# Patient Record
Sex: Female | Born: 1960 | Race: White | Hispanic: No | Marital: Single | Smoking: Current every day smoker
Health system: Southern US, Community
[De-identification: ages and names within clinical notes are randomized; demographics above are authoritative.]

## PROBLEM LIST (undated history)

## (undated) DIAGNOSIS — F419 Anxiety disorder, unspecified: Secondary | ICD-10-CM

## (undated) DIAGNOSIS — M1990 Unspecified osteoarthritis, unspecified site: Secondary | ICD-10-CM

## (undated) DIAGNOSIS — E039 Hypothyroidism, unspecified: Secondary | ICD-10-CM

## (undated) HISTORY — PX: APPENDECTOMY: SHX54

## (undated) HISTORY — PX: OTHER SURGICAL HISTORY: SHX169

---

## 2003-11-11 ENCOUNTER — Ambulatory Visit (HOSPITAL_COMMUNITY): Admission: RE | Admit: 2003-11-11 | Discharge: 2003-11-11 | Payer: Self-pay | Admitting: Orthopaedic Surgery

## 2005-05-10 ENCOUNTER — Ambulatory Visit (HOSPITAL_COMMUNITY): Admission: RE | Admit: 2005-05-10 | Discharge: 2005-05-10 | Payer: Self-pay | Admitting: Pediatrics

## 2005-05-19 ENCOUNTER — Ambulatory Visit (HOSPITAL_COMMUNITY): Admission: RE | Admit: 2005-05-19 | Discharge: 2005-05-19 | Payer: Self-pay | Admitting: Pediatrics

## 2005-05-30 ENCOUNTER — Encounter (INDEPENDENT_AMBULATORY_CARE_PROVIDER_SITE_OTHER): Payer: Self-pay | Admitting: *Deleted

## 2005-05-30 ENCOUNTER — Ambulatory Visit (HOSPITAL_COMMUNITY): Admission: RE | Admit: 2005-05-30 | Discharge: 2005-05-30 | Payer: Self-pay | Admitting: Pediatrics

## 2005-09-07 ENCOUNTER — Encounter (HOSPITAL_COMMUNITY): Admission: RE | Admit: 2005-09-07 | Discharge: 2005-09-30 | Payer: Self-pay | Admitting: Pediatrics

## 2005-09-29 ENCOUNTER — Encounter (INDEPENDENT_AMBULATORY_CARE_PROVIDER_SITE_OTHER): Payer: Self-pay | Admitting: *Deleted

## 2005-09-29 ENCOUNTER — Inpatient Hospital Stay (HOSPITAL_COMMUNITY): Admission: EM | Admit: 2005-09-29 | Discharge: 2005-10-01 | Payer: Self-pay | Admitting: Emergency Medicine

## 2006-07-12 ENCOUNTER — Encounter (HOSPITAL_COMMUNITY): Admission: RE | Admit: 2006-07-12 | Discharge: 2006-08-11 | Payer: Self-pay | Admitting: Endocrinology

## 2007-03-31 ENCOUNTER — Emergency Department (HOSPITAL_COMMUNITY): Admission: EM | Admit: 2007-03-31 | Discharge: 2007-03-31 | Payer: Self-pay | Admitting: Emergency Medicine

## 2007-04-01 ENCOUNTER — Emergency Department (HOSPITAL_COMMUNITY): Admission: EM | Admit: 2007-04-01 | Discharge: 2007-04-01 | Payer: Self-pay | Admitting: Emergency Medicine

## 2007-05-07 ENCOUNTER — Ambulatory Visit (HOSPITAL_COMMUNITY): Admission: RE | Admit: 2007-05-07 | Discharge: 2007-05-07 | Payer: Self-pay | Admitting: Pediatrics

## 2007-05-28 ENCOUNTER — Ambulatory Visit (HOSPITAL_COMMUNITY): Admission: RE | Admit: 2007-05-28 | Discharge: 2007-05-28 | Payer: Self-pay | Admitting: Pediatrics

## 2009-06-01 ENCOUNTER — Ambulatory Visit (HOSPITAL_COMMUNITY): Admission: RE | Admit: 2009-06-01 | Discharge: 2009-06-01 | Payer: Self-pay | Admitting: Pediatrics

## 2010-01-22 ENCOUNTER — Encounter: Payer: Self-pay | Admitting: Neurology

## 2010-01-23 ENCOUNTER — Encounter: Payer: Self-pay | Admitting: Pediatrics

## 2010-05-20 NOTE — Op Note (Signed)
[name], [name]                 ACCOUNT NO.:  [id number]   MEDICAL RECORD NO.:  [id number]          PATIENT TYPE:  INP   LOCATION:  A319                          FACILITY:  [facility]   PHYSICIAN:  Barbaraann Barthel, M.D. DATE OF BIRTH:  1960-02-19   DATE OF PROCEDURE:  09/29/2005  DATE OF DISCHARGE:  10/01/2005                                 OPERATIVE REPORT   SURGEON:  Barbaraann Barthel, M.D.   PREOPERATIVE DIAGNOSIS:  Acute appendicitis.   POSTOPERATIVE DIAGNOSIS:  Acute appendicitis.   PROCEDURE:  Open appendectomy.   NOTE:  This is a 50 year old white female who came to the emergency room  with signs and symptoms of acute appendicitis with right lower quadrant  pain, elevated white count and nausea and vomiting.  This was also confirmed  on CT scan.  We discussed surgery with her preoperatively, discussing  complications not limited to but including bleeding, infection and leaking  from appendiceal stump.  Informed consent was obtained.   GROSS OPERATIVE FINDINGS:  Those consistent with acute suppurative  nonperforated appendicitis.  The rest of the right lower quadrant and the  terminal ileum was normal.   TECHNIQUE:  The patient was placed in the supine position.  After the  adequate administration of general anesthesia, her entire abdomen was  prepped with Betadine solution and draped in the usual manner.  A low small  transverse incision was carried out in the right lower quadrant through  skin, subcutaneous tissue down to the rectus muscle, the sheath of which was  opened and the muscle was retracted medially.  The posterior sheath and the  peritoneum was grasped between two clamps, and the abdominal cavity was then  entered carefully and explored with the above finding.  I placed a wound  liner within the abdomen and then delivered the appendix easily into the  incision, ligating the mesoappendix with 2-0 silk and amputating with a TA-  30 stapling device.  I chose to  imbricate the stump with 3-0 GI silk.  We  then changed gloves, irrigated the right lower quadrant copiously with  normal saline solution, and I closed the peritoneum with a running 0 Vicryl  suture and the fascia with interrupted figure-of-eight Vicryl sutures.  I  used 10 mL of 0.5% Sensorcaine to help with postoperative comfort and closed  the skin with a  stapling device after irrigating.  The wound was then covered with a sterile  dressing.  Prior to closure, all sponge, needle and instrument counts were  found to be correct.  Estimated blood loss was minimal.  The patient  received 1100 mL of crystalloids intraoperatively.  No drains were placed  and there were no complications.      Barbaraann Barthel, M.D.  Electronically Signed     WB/MEDQ  D:  09/29/2005  T:  10/01/2005  Job:  [id number]   cc:   Rhae Lerner. Margretta Ditty, M.D.  [address]  [place]  [place] [address]   Rosalio Macadamia  Fax: [phone]   Daivd Council  Fax: [phone]

## 2010-05-20 NOTE — H&P (Signed)
[name], [name]                 ACCOUNT NO.:  [id number]   MEDICAL RECORD NO.:  [id number]          PATIENT TYPE:  EMS   LOCATION:  ED                            FACILITY:  [facility]   PHYSICIAN:  Barbaraann Barthel, M.D. DATE OF BIRTH:  Nov 18, 1960   DATE OF ADMISSION:  09/29/2005  DATE OF DISCHARGE:  LH                                HISTORY & PHYSICAL   NOTE:  Surgery was asked to see this 50 year old white female who came into  the emergency room with signs and symptoms of appendicitis for less than 24  hours' duration.   CHIEF COMPLAINT:  Nausea and vomiting and right lower quadrant pain.   HISTORY OF PRESENT MEDICAL ILLNESS:  The patient stated that last night she  developed epigastric discomfort in the evening and later this pain, which  she described as being rather severe, located from the epigastrium into the  right lower quadrant and was accompanied with several bouts of nausea and  vomiting.  She came to the emergency room.  She was seen here and evaluated.  CT scan revealed acute appendicitis.  Surgery was consulted and responded  immediately.   PHYSICAL EXAMINATION:  GENERAL:  A pleasant 50 year old white female.  VITAL SIGNS:  Temperature is 97.7.  She is 5 feet 4 inches, weighs 149  pounds.  Her blood pressure is 113/59, pulse is 82 per minute and  respirations are 18 per minute.  HEENT:  Head is normocephalic.  Eyes:  Extraocular movements are intact.  Pupils are round and react to light and accommodation.  There is no  conjunctive pallor or scleral injection.  There is no exophthalmos.  NECK:  No bruits are appreciated nor adenopathy.  The patient has a  diffusely enlarged thyroid.  Thyromegaly is smooth on palpation and greater  on the left side.  CHEST:  Clear both anterior and posterior auscultation.  HEART:  Regular rhythm.  BREASTS:  Breasts and axillae are without masses.  ABDOMEN:  The patient has diminished bowel sounds, exquisitely tender in the  right  lower quadrant with guarding.  No femoral or inguinal hernias  appreciated.  RECTAL:  Guaiac-negative stool.  No masses are palpated.  EXTREMITIES:  Within normal limits.   REVIEW OF SYSTEMS:  OB/GYN HISTORY:  The patient is a gravida 2, para 2,  abortus 0, cesarean 0, female, who has had a tubal ligation.  Her last  menstrual period was 3 weeks ago.  She has had a mammogram this year and has  no family history of breast cancer.  GENITOURINARY:  She has had urinary  tract infections in the past.  She has no dysuria present and no past  history of nephrolithiasis.  ENDOCRINE:  She has been worked up for thyroid  nodules and is scheduled to undergo radioablation of the thyroid gland by  Dr. Patrecia Pace.  This is not taken place as yet.  She has no family history of  thyroid disease that she reveals and no history of diabetes mellitus.  CARDIORESPIRATORY:  The patient smokes a half-a-pack of cigarettes per day.  NEUROLOGIC:  No history of seizures or migraines.  She has had an MRI of the  brain in the past, which was unremarkable.   MEDICATIONS:  The patient takes no medications on a regular basis.   Smokes a half-a-pack of cigarettes per day.   Has no known allergies.   REVIEW OF LABORATORY DATA:  The patient's CT scan, as mentioned above,  revealed changes consistent with acute appendicitis.  Clinically she has  that as well.  Her laboratory work was as follows:  Her white count is 21.8  with an H&H of 14.3 an 41.0, platelets of 292,000.  She has 87% neutrophils.  Her Metabolic-7 is grossly within normal limits with a BUN of 14 and  creatinine 0.7.  Liver function studies are within normal limits, and her  electrolytes otherwise are within normal limits.  Potassium is 3.9 and  sodium 139.  Urinalysis is also grossly within normal limits.   IMPRESSION:  1. Acute appendicitis.  2. History of thyroid nodule, benign.  This has been biopsied in the past.   I will check thyroid function  studies preoperatively.  She has been made  n.p.o., antibiotics and hydration have been initiated, and we will plan for  appendectomy as soon as possible.   I discussed complications with the patient not limited to but including  bleeding, infection and leakage from appendiceal stump, and informed consent  was obtained.      Barbaraann Barthel, M.D.  Electronically Signed     WB/MEDQ  D:  09/29/2005  T:  09/29/2005  Job:  [id number]   cc:   Francoise Schaumann. Raynelle Highland  Fax: [phone]   Alan Mulder, M.D.  Fax: [phone]

## 2010-05-20 NOTE — Discharge Summary (Signed)
[name], HAVRILLA                 ACCOUNT NO.:  [id number]   MEDICAL RECORD NO.:  [id number]          PATIENT TYPE:  INP   LOCATION:  A319                          FACILITY:  [facility]   PHYSICIAN:  Barbaraann Barthel, M.D. DATE OF BIRTH:  Jun 02, 1960   DATE OF ADMISSION:  09/29/2005  DATE OF DISCHARGE:  09/30/2007LH                                 DISCHARGE SUMMARY   DIAGNOSIS:  Acute appendicitis.   NOTE:  This is a 50 year old white female who came to the emergency room  with signs and symptoms of acute appendicitis.  This was confirmed by CT  scan.  She was taken to surgery directly from the emergency room after  hydration and antibiotics were initiated.  She was found to have supportive,  nonperforated acute appendicitis.  An appendectomy was performed  unremarkably. She did well postoperatively. On the second postoperative day  she was discharged.  At the time of discharge, her wound was clean.  She was  voiding without dysuria.  She had no leg pain, shortness of breath or any  sign of any wound infection.  She was tolerating liquid diet without nausea.   LABORATORY DATA:  Pathology report is not on the chart at the time of this  dictation.  Her white count was initially 21.8 and on September 30, 2005, it  was 8.9 with a hemoglobin of 12.1 and hematocrit of 35.7 with a normal  differential.  Electrolytes also were grossly within normal limits.   CT scan: Results as mentioned above and most consistent with acute  appendicitis.   She also so had an EKG which was essentially normal.   She had some thyroid function studies drawn that are not on the chart at the  time of this dictation for some reason or other.  Will make sure that Dr.  Johny Chess gets these as this patient has a followup appointment with him for  thyroid ablation for a biopsy-proven benign multinodular goiter.   DISCHARGE INSTRUCTIONS:  The patient is discharged on a full liquid to soft  diet.  She is excused from  work.  She is told to increase her activity as  tolerated.  She is permitted to shower, walk up and down the stairs.  She is  told to no vigorous sexual activity or driving or heavy lifting.   She is discharged with a prescription for:  1. Darvocet-N 100 1 tablet every 4 hours as needed for pain.  2. Cipro 500 mg every 12 hours for an additional 5 days.  3. She is warned against using harsh cathartics or Ex-Lax to prevent any      appendiceal stump leakage.  4. She may take Colace 100 mg daily for her bowels.   We have made followup arrangements to see her on Thursday, October 4 at 10  o'clock. She has then given my number, and she is told to call me should she  have any acute problems or go to the emergency room.  She did quite well  postoperatively, and she is encouraged to return to Dr. Milford Cage and Dr.  Johny Chess  for her medical problems.      Barbaraann Barthel, M.D.  Electronically Signed     WB/MEDQ  D:  10/01/2005  T:  10/02/2005  Job:  [id number]   cc:   Francoise Schaumann. Raynelle Highland  Fax: [phone]   Alan Mulder, M.D.  Fax: [phone]

## 2010-11-25 ENCOUNTER — Other Ambulatory Visit (HOSPITAL_COMMUNITY): Payer: Self-pay | Admitting: Pediatrics

## 2010-11-25 ENCOUNTER — Ambulatory Visit (HOSPITAL_COMMUNITY)
Admission: RE | Admit: 2010-11-25 | Discharge: 2010-11-25 | Disposition: A | Discharge status: Home / Self Care | Payer: Medicaid Other | Source: Ambulatory Visit | Attending: Pediatrics | Admitting: Pediatrics

## 2010-11-25 DIAGNOSIS — Z1231 Encounter for screening mammogram for malignant neoplasm of breast: Secondary | ICD-10-CM | POA: Insufficient documentation

## 2010-11-25 DIAGNOSIS — Z139 Encounter for screening, unspecified: Secondary | ICD-10-CM

## 2012-05-21 ENCOUNTER — Other Ambulatory Visit (HOSPITAL_COMMUNITY): Payer: Self-pay | Admitting: Family Medicine

## 2012-05-21 DIAGNOSIS — Z139 Encounter for screening, unspecified: Secondary | ICD-10-CM

## 2012-05-23 ENCOUNTER — Ambulatory Visit (HOSPITAL_COMMUNITY): Payer: Medicaid Other

## 2012-06-10 ENCOUNTER — Ambulatory Visit (HOSPITAL_COMMUNITY)
Admission: RE | Admit: 2012-06-10 | Discharge: 2012-06-10 | Disposition: A | Discharge status: Home / Self Care | Payer: Medicaid Other | Source: Ambulatory Visit | Attending: Family Medicine | Admitting: Family Medicine

## 2012-06-10 DIAGNOSIS — Z1231 Encounter for screening mammogram for malignant neoplasm of breast: Secondary | ICD-10-CM | POA: Insufficient documentation

## 2012-06-10 DIAGNOSIS — Z139 Encounter for screening, unspecified: Secondary | ICD-10-CM

## 2012-07-23 ENCOUNTER — Other Ambulatory Visit (HOSPITAL_COMMUNITY): Payer: Self-pay | Admitting: "Endocrinology

## 2012-07-23 DIAGNOSIS — E049 Nontoxic goiter, unspecified: Secondary | ICD-10-CM

## 2012-07-25 ENCOUNTER — Ambulatory Visit (HOSPITAL_COMMUNITY)
Admission: RE | Admit: 2012-07-25 | Discharge: 2012-07-25 | Disposition: A | Discharge status: Home / Self Care | Payer: Medicaid Other | Source: Ambulatory Visit | Attending: "Endocrinology | Admitting: "Endocrinology

## 2012-07-25 DIAGNOSIS — E049 Nontoxic goiter, unspecified: Secondary | ICD-10-CM | POA: Insufficient documentation

## 2013-05-30 ENCOUNTER — Other Ambulatory Visit (HOSPITAL_COMMUNITY): Payer: Self-pay | Admitting: Family Medicine

## 2013-05-30 DIAGNOSIS — Z1231 Encounter for screening mammogram for malignant neoplasm of breast: Secondary | ICD-10-CM

## 2013-06-18 ENCOUNTER — Other Ambulatory Visit (HOSPITAL_COMMUNITY): Payer: Self-pay | Admitting: "Endocrinology

## 2013-06-18 DIAGNOSIS — E042 Nontoxic multinodular goiter: Secondary | ICD-10-CM

## 2013-06-27 ENCOUNTER — Ambulatory Visit (HOSPITAL_COMMUNITY)
Admission: RE | Admit: 2013-06-27 | Discharge: 2013-06-27 | Disposition: A | Discharge status: Home / Self Care | Payer: Medicaid Other | Source: Ambulatory Visit | Attending: "Endocrinology | Admitting: "Endocrinology

## 2013-06-27 DIAGNOSIS — E042 Nontoxic multinodular goiter: Secondary | ICD-10-CM | POA: Insufficient documentation

## 2013-07-01 ENCOUNTER — Ambulatory Visit (HOSPITAL_COMMUNITY)
Admission: RE | Admit: 2013-07-01 | Discharge: 2013-07-01 | Disposition: A | Discharge status: Home / Self Care | Payer: Medicaid Other | Source: Ambulatory Visit | Attending: Family Medicine | Admitting: Family Medicine

## 2013-07-01 DIAGNOSIS — Z1231 Encounter for screening mammogram for malignant neoplasm of breast: Secondary | ICD-10-CM | POA: Insufficient documentation

## 2013-07-22 NOTE — H&P (Signed)
  NTS SOAP Note  Vital Signs:  Vitals as of: 8/52/7782: Systolic 423: Diastolic 75: Heart Rate 75: Temp 97.59F: Height 64ft 4.5in: Weight 148Lbs 0 Ounces: BMI 25.01  BMI : 25.01 kg/m2  Subjective: This 53 Years 72 Months old Female presents for a multinodular goiter.  Has been present for some time, starting to cause pressure sensation when swallowing.  No voice changes, weight change, heart palpitations, family h/o thyroid cancer.  Did have RAI therapy in 2007 for hyperthyroidism.  Biopsy in past negative for malignancy.  Review of Symptoms:  Constitutional:unremarkable   Head:unremarkable    Eyes:unremarkable   Nose/Mouth/Throat:unremarkable Cardiovascular:  unremarkable   Respiratory:unremarkable   Gastrointestinal:  unremarkable   Genitourinary:unremarkable       neck pain Skin:unremarkable Hematolgic/Lymphatic:unremarkable     Allergic/Immunologic:unremarkable     Past Medical History:    Reviewed  Past Medical History  Surgical History: appendectomy, BTL Medical Problems: arthritis Allergies: nkda Medications: diclofenac   Social History:Reviewed  Social History  Preferred Language: English Race:  White Ethnicity: Not Hispanic / Latino Age: 26 Years 10 Months Marital Status:  S Alcohol: no   Smoking Status: Current every day smoker reviewed on 07/22/2013 Started Date:  Packs per day: 0.50 Functional Status reviewed on 07/22/2013 ------------------------------------------------ Bathing: Normal Cooking: Normal Dressing: Normal Driving: Normal Eating: Normal Managing Meds: Normal Oral Care: Normal Shopping: Normal Toileting: Normal Transferring: Normal Walking: Normal Cognitive Status reviewed on 07/22/2013 ------------------------------------------------ Attention: Normal Decision Making: Normal Language: Normal Memory: Normal Motor: Normal Perception: Normal Problem Solving: Normal Visual and  Spatial: Normal   Family History:  Reviewed  Family Health History Mother, Living; Healthy;  Father, Deceased; History Unknown    Objective Information: General:  Well appearing, well nourished in no distress.   Enlarged thyroid bilaterally with multiple nodules.  No tracheal shift.  No lymphadenopathy. Heart:  RRR, no murmur or gallop.  Normal S1, S2.  No S3, S4.  Lungs:    CTA bilaterally, no wheezes, rhonchi, rales.  Breathing unlabored.  Assessment:Multinodular goiter  Diagnoses: 241.1 Goiter (Nontoxic multinodular goiter)  Procedures: 53614 - OFFICE OUTPATIENT NEW 30 MINUTES    Plan:  Scheduled for total thyroidectomy on 08/13/13.   Patient Education:Alternative treatments to surgery were discussed with patient (and family).  Risks and benefits  of procedure including bleeding, infection, nerve injury, voice changes, and low calcium were fully explained to the patient (and family) who gave informed consent. Patient/family questions were addressed.  Follow-up:Pending Surgery

## 2013-08-06 ENCOUNTER — Encounter (HOSPITAL_COMMUNITY): Payer: Self-pay | Admitting: Pharmacy Technician

## 2013-08-08 ENCOUNTER — Other Ambulatory Visit (HOSPITAL_COMMUNITY): Payer: Medicaid Other

## 2013-08-08 NOTE — Patient Instructions (Signed)
[name]  08/08/2013   Your procedure is scheduled on:  08/15/2013  Report to [facility] at 8:15 AM.  Call this number if you have problems the morning of surgery: [phone]   Remember:   Do not eat food or drink liquids after midnight.   Take these medicines the morning of surgery with A SIP OF WATER: Voltaren if  needed    Do not wear jewelry, make-up or nail polish.  Do not wear lotions, powders, or perfumes. You may wear deodorant.  Do not shave 48 hours prior to surgery. Men may shave face and neck.  Do not bring valuables to the hospital.  [facility] is not responsible for any  belongings or valuables.               Contacts, dentures or bridgework may not be worn into surgery.  Leave suitcase in the car. After surgery it may be brought to your room.  For patients admitted to the hospital, discharge time is determined by your                 treatment team.               Patients discharged the day of surgery will not be allowed to drive home.  Name and phone number of your driver:   Special Instructions: Shower using CHG 2 nights before surgery and the night before surgery.  If you shower the day of surgery use CHG.  Use special wash - you have one bottle of CHG for all showers.  You should use approximately 1/3 of the bottle for each shower.   Please read over the following fact sheets that you were given: Surgical Site Infection Prevention and Anesthesia Post-op Instructions   PATIENT INSTRUCTIONS POST-ANESTHESIA  IMMEDIATELY FOLLOWING SURGERY:  Do not drive or operate machinery for the first twenty four hours after surgery.  Do not make any important decisions for twenty four hours after surgery or while taking narcotic pain medications or sedatives.  If you develop intractable nausea and vomiting or a severe headache please notify your doctor immediately.  FOLLOW-UP:  Please make an appointment with your surgeon as instructed. You do not need to follow up with  anesthesia unless specifically instructed to do so.  WOUND CARE INSTRUCTIONS (if applicable):  Keep a dry clean dressing on the anesthesia/puncture wound site if there is drainage.  Once the wound has quit draining you may leave it open to air.  Generally you should leave the bandage intact for twenty four hours unless there is drainage.  If the epidural site drains for more than 36-48 hours please call the anesthesia department.  QUESTIONS?:  Please feel free to call your physician or the hospital operator if you have any questions, and they will be happy to assist you.      Thyroidectomy Thyroidectomy is the removal of part or all of your thyroid gland. Your thyroid gland is a butterfly-shaped gland at the base of your neck. It produces a substance called thyroid hormone, which regulates the physical and chemical processes that keep your body functioning and make energy available to your body (metabolism). The amount of thyroid gland tissue that is removed during a thyroidectomy depends on the reason for the procedure. Typically, if only a part of your gland is removed, enough thyroid gland tissue remains to maintain normal function. If your entire thyroid gland is removed or if the amount of thyroid gland tissue remaining is  inadequate to maintain normal function, you will need life-long treatment with thyroid hormone on a daily basis. Thyroidectomy maybe performed when you have the following conditions:  Thyroid nodules. These are small, abnormal collections of tissue that form inside the thyroid gland. If these nodules begin to enlarge at a rapid rate, a sample of tissue from the nodule is taken through a needle and examined (needle biopsy). This is done to determine if the nodules are cancerous. Depending on the outcome of this exam, thyroidectomy may be necessary.  Thyroid cancer.  Goiter, which is an enlarged thyroid gland. All or part of the thyroid gland may be removed if the gland has  become so large that it causes difficulty breathing or swallowing.  Hyperthyroidism. This is when the thyroid gland produces too much thyroid hormone. Hypothyroidism can cause symptoms of fluctuating weight, intolerance to heat, irritability, shortness of breath, and chest pain. LET YOUR CAREGIVER KNOW ABOUT:   Allergies to food or medicine.  Medicines that you are taking, including vitamins, herbs, eyedrops, over-the-counter medicines, and creams.  Previous problems you have had with anesthetics or numbing medicines.  History of bleeding problems or blood clots.  Previous surgeries you have had.  Other health problems, including diabetes and kidney problems, you have had.  Possibility of pregnancy, if this applies. BEFORE THE PROCEDURE   Do not eat or drink anything, including water, for at least 6 hours before the procedure.  Ask your caregiver whether you should stop taking certain medicines before the day of the procedure. PROCEDURE  There are different ways that thyroidectomy is performed. For each type, you will be given a medicine to make you sleep (general anesthetic). The three main types of thyroidectomy are listed as follows:  Conventional thyroidectomy--A cut (incision) in the center portion of your lower neck is made with a scalpel. Muscles below your skin are separated to gain access to your thyroid gland. Your thyroid gland is dissected from your windpipe (trachea). Often a drain is placed at the incision site to drain any blood that accumulates under the skin after the procedure. This drain will be removed before you go home. The wound from the incision should heal within 2 weeks.  Endoscopic thyroidectomy--Small incisions are made in your lower neck. A small instrument (endoscope) is inserted under your skin at the incision sites. The endoscope used for thyroidectomy consists of 2 flexible tubes. Inside one of the tubes is a video camera that is used to guide the Psychologist, sport and exercise.  Tools to remove the thyroid gland, including a tool to cut the gland (dissectors) and a suction device, are inserted through the other tube. The surgeon uses the dissectors to dissect the thyroid gland from the trachea and remove it.  Robotic thyroidectomy--This procedure allows your thyroid gland to be removed through incisions in your armpit, your chest, or high in your neck. Instruments similar to endoscopes provide a 3-dimensional picture of the surgical site. Dissecting instruments are controlled by devices similar to joysticks. These devices allow more accurate manipulation of the instruments. After the blood supply to the gland is removed, the gland is cut into several pieces and removed through the incisions. RISKS AND COMPLICATIONS Complications associated with thyroidectomy are rare, but they can occur. Possible complications include:  A decrease in parathyroid hormone levels (hypoparathyroidism)--Your parathyroid glands are located close behind your thyroid gland. They are responsible for maintaining calcium levels inthe body. If they are damaged or removed, levels of calcium in the blood become low and nerves  become irritable, which can cause muscle spasms. Medicines are available to treat this.  Bacterial infection--This can often be treated with medicines that kill bacteria (antibiotics).  Damage to your voice box nerves--This could cause hoarseness or complete loss of voice.  Bleeding or airway obstruction. AFTER THE PROCEDURE   You will rest in the recovery room as you wake up.  When you first wake up, your throat may feel slightly sore.  You will not be allowed to eat or drink until instructed otherwise.  You will be taken to your hospital room. You will usually stay at the hospital for 1 or 2 nights.  If a drain is placed during the procedure, it usually is removed the next day.  You may have some mild neck pain.  Your voice may be weak. This usually is  temporary. Document Released: 06/14/2000 Document Revised: 04/15/2012 Document Reviewed: 03/23/2010 [facility] Patient Information 2015 [place], [place]. This information is not intended to replace advice given to you by your health care provider. Make sure you discuss any questions you have with your health care provider.

## 2013-08-11 ENCOUNTER — Encounter (HOSPITAL_COMMUNITY)
Admission: RE | Admit: 2013-08-11 | Discharge: 2013-08-11 | Disposition: A | Discharge status: Home / Self Care | Payer: Medicaid Other | Source: Ambulatory Visit | Attending: General Surgery | Admitting: General Surgery

## 2013-08-11 ENCOUNTER — Encounter (HOSPITAL_COMMUNITY): Payer: Self-pay

## 2013-08-11 DIAGNOSIS — Z01812 Encounter for preprocedural laboratory examination: Secondary | ICD-10-CM | POA: Diagnosis present

## 2013-08-11 DIAGNOSIS — Z01818 Encounter for other preprocedural examination: Secondary | ICD-10-CM | POA: Insufficient documentation

## 2013-08-11 HISTORY — DX: Unspecified osteoarthritis, unspecified site: M19.90

## 2013-08-11 LAB — CBC WITH DIFFERENTIAL/PLATELET
BASOS ABS: 0 10*3/uL (ref 0.0–0.1)
Basophils Relative: 0 % (ref 0–1)
EOS PCT: 5 % (ref 0–5)
Eosinophils Absolute: 0.4 10*3/uL (ref 0.0–0.7)
HCT: 43.9 % (ref 36.0–46.0)
HEMOGLOBIN: 14.6 g/dL (ref 12.0–15.0)
LYMPHS PCT: 34 % (ref 12–46)
Lymphs Abs: 2.4 10*3/uL (ref 0.7–4.0)
MCH: 29.6 pg (ref 26.0–34.0)
MCHC: 33.3 g/dL (ref 30.0–36.0)
MCV: 89 fL (ref 78.0–100.0)
Monocytes Absolute: 0.4 10*3/uL (ref 0.1–1.0)
Monocytes Relative: 6 % (ref 3–12)
Neutro Abs: 3.9 10*3/uL (ref 1.7–7.7)
Neutrophils Relative %: 55 % (ref 43–77)
PLATELETS: 283 10*3/uL (ref 150–400)
RBC: 4.93 MIL/uL (ref 3.87–5.11)
RDW: 13.4 % (ref 11.5–15.5)
WBC: 7.2 10*3/uL (ref 4.0–10.5)

## 2013-08-11 LAB — COMPREHENSIVE METABOLIC PANEL
ALBUMIN: 4.1 g/dL (ref 3.5–5.2)
ALT: 35 U/L (ref 0–35)
ANION GAP: 10 (ref 5–15)
AST: 19 U/L (ref 0–37)
Alkaline Phosphatase: 70 U/L (ref 39–117)
BUN: 12 mg/dL (ref 6–23)
CHLORIDE: 104 meq/L (ref 96–112)
CO2: 30 mEq/L (ref 19–32)
CREATININE: 0.63 mg/dL (ref 0.50–1.10)
Calcium: 9.9 mg/dL (ref 8.4–10.5)
GFR calc Af Amer: >90 mL/min (ref >90)
GFR calc non Af Amer: >90 mL/min (ref >90)
Glucose, Bld: 97 mg/dL (ref 70–99)
Potassium: 4.8 mEq/L (ref 3.7–5.3)
Sodium: 144 mEq/L (ref 137–147)
Total Bilirubin: 0.3 mg/dL (ref 0.3–1.2)
Total Protein: 7.1 g/dL (ref 6.0–8.3)

## 2013-08-11 LAB — HCG, SERUM, QUALITATIVE: PREG SERUM: NEGATIVE

## 2013-08-15 ENCOUNTER — Ambulatory Visit (HOSPITAL_COMMUNITY): Payer: Medicaid Other | Admitting: Anesthesiology

## 2013-08-15 ENCOUNTER — Encounter (HOSPITAL_COMMUNITY)
Admission: RE | Disposition: A | Discharge status: Home / Self Care | Payer: Self-pay | Source: Ambulatory Visit | Attending: General Surgery

## 2013-08-15 ENCOUNTER — Ambulatory Visit (HOSPITAL_COMMUNITY)
Admission: RE | Admit: 2013-08-15 | Discharge: 2013-08-17 | Disposition: A | Discharge status: Home / Self Care | Payer: Medicaid Other | Source: Ambulatory Visit | Attending: General Surgery | Admitting: General Surgery

## 2013-08-15 ENCOUNTER — Encounter (HOSPITAL_COMMUNITY): Payer: Medicaid Other | Admitting: Anesthesiology

## 2013-08-15 ENCOUNTER — Encounter (HOSPITAL_COMMUNITY): Payer: Self-pay | Admitting: *Deleted

## 2013-08-15 DIAGNOSIS — E890 Postprocedural hypothyroidism: Secondary | ICD-10-CM

## 2013-08-15 DIAGNOSIS — Z9089 Acquired absence of other organs: Secondary | ICD-10-CM

## 2013-08-15 DIAGNOSIS — F17200 Nicotine dependence, unspecified, uncomplicated: Secondary | ICD-10-CM | POA: Diagnosis not present

## 2013-08-15 DIAGNOSIS — E042 Nontoxic multinodular goiter: Secondary | ICD-10-CM | POA: Diagnosis present

## 2013-08-15 DIAGNOSIS — M129 Arthropathy, unspecified: Secondary | ICD-10-CM | POA: Diagnosis not present

## 2013-08-15 DIAGNOSIS — Z9889 Other specified postprocedural states: Secondary | ICD-10-CM

## 2013-08-15 HISTORY — PX: THYROIDECTOMY: SHX17

## 2013-08-15 LAB — COMPREHENSIVE METABOLIC PANEL
ALK PHOS: 56 U/L (ref 39–117)
ALT: 32 U/L (ref 0–35)
ANION GAP: 14 (ref 5–15)
AST: 20 U/L (ref 0–37)
Albumin: 3.5 g/dL (ref 3.5–5.2)
BILIRUBIN TOTAL: 0.2 mg/dL — AB (ref 0.3–1.2)
BUN: 11 mg/dL (ref 6–23)
CO2: 24 mEq/L (ref 19–32)
Calcium: 8.8 mg/dL (ref 8.4–10.5)
Chloride: 102 mEq/L (ref 96–112)
Creatinine, Ser: 0.68 mg/dL (ref 0.50–1.10)
GFR calc non Af Amer: >90 mL/min (ref >90)
GLUCOSE: 182 mg/dL — AB (ref 70–99)
POTASSIUM: 4.3 meq/L (ref 3.7–5.3)
SODIUM: 140 meq/L (ref 137–147)
Total Protein: 6.4 g/dL (ref 6.0–8.3)

## 2013-08-15 SURGERY — THYROIDECTOMY
Anesthesia: General

## 2013-08-15 MED ORDER — CHLORHEXIDINE GLUCONATE 4 % EX LIQD
1.0000 | Freq: Once | CUTANEOUS | Status: DC
Start: 2013-08-15 — End: 2013-08-15

## 2013-08-15 MED ORDER — SODIUM CHLORIDE 0.9 % IR SOLN
Status: DC | PRN
  Administered 2013-08-15: 1000 mL

## 2013-08-15 MED ORDER — CEFAZOLIN SODIUM-DEXTROSE 2-3 GM-% IV SOLR
INTRAVENOUS | Status: AC
  Filled 2013-08-15: qty 50

## 2013-08-15 MED ORDER — PHENYLEPHRINE HCL 10 MG/ML IJ SOLN
INTRAMUSCULAR | Status: DC | PRN
  Administered 2013-08-15 (×2): 50 ug via INTRAVENOUS

## 2013-08-15 MED ORDER — ONDANSETRON HCL 4 MG PO TABS: 4.0000 mg | ORAL_TABLET | Freq: Four times a day (QID) | ORAL | Status: DC | PRN

## 2013-08-15 MED ORDER — LEVOTHYROXINE SODIUM 100 MCG PO TABS
100.0000 ug | ORAL_TABLET | Freq: Every day | ORAL | Status: DC
  Administered 2013-08-16 – 2013-08-17 (×2): 100 ug via ORAL
  Filled 2013-08-15 (×2): qty 1

## 2013-08-15 MED ORDER — BUPIVACAINE HCL (PF) 0.5 % IJ SOLN
INTRAMUSCULAR | Status: AC
  Filled 2013-08-15: qty 30

## 2013-08-15 MED ORDER — ROCURONIUM BROMIDE 50 MG/5ML IV SOLN
INTRAVENOUS | Status: AC
  Filled 2013-08-15: qty 1

## 2013-08-15 MED ORDER — GLYCOPYRROLATE 0.2 MG/ML IJ SOLN
INTRAMUSCULAR | Status: AC
  Filled 2013-08-15: qty 2

## 2013-08-15 MED ORDER — PHENYLEPHRINE HCL 10 MG/ML IJ SOLN
INTRAMUSCULAR | Status: AC
  Filled 2013-08-15: qty 1

## 2013-08-15 MED ORDER — KETOROLAC TROMETHAMINE 30 MG/ML IJ SOLN
INTRAMUSCULAR | Status: AC
  Filled 2013-08-15: qty 1

## 2013-08-15 MED ORDER — ROCURONIUM BROMIDE 100 MG/10ML IV SOLN
INTRAVENOUS | Status: DC | PRN
  Administered 2013-08-15 (×2): 10 mg via INTRAVENOUS
  Administered 2013-08-15: 30 mg via INTRAVENOUS

## 2013-08-15 MED ORDER — FENTANYL CITRATE 0.05 MG/ML IJ SOLN
INTRAMUSCULAR | Status: DC | PRN
  Administered 2013-08-15 (×7): 50 ug via INTRAVENOUS

## 2013-08-15 MED ORDER — HYDROMORPHONE HCL PF 1 MG/ML IJ SOLN
1.0000 mg | INTRAMUSCULAR | Status: DC | PRN
Start: 2013-08-15 — End: 2013-08-17
  Administered 2013-08-15 – 2013-08-16 (×2): 1 mg via INTRAVENOUS
  Filled 2013-08-15 (×2): qty 1

## 2013-08-15 MED ORDER — CEFAZOLIN SODIUM-DEXTROSE 2-3 GM-% IV SOLR
2.0000 g | INTRAVENOUS | Status: AC
  Administered 2013-08-15: 2 g via INTRAVENOUS

## 2013-08-15 MED ORDER — NEOSTIGMINE METHYLSULFATE 10 MG/10ML IV SOLN
INTRAVENOUS | Status: DC | PRN
  Administered 2013-08-15: 2 mg via INTRAVENOUS

## 2013-08-15 MED ORDER — DEXAMETHASONE SODIUM PHOSPHATE 4 MG/ML IJ SOLN
4.0000 mg | Freq: Once | INTRAMUSCULAR | Status: AC
  Administered 2013-08-15: 4 mg via INTRAVENOUS

## 2013-08-15 MED ORDER — ONDANSETRON HCL 4 MG/2ML IJ SOLN
INTRAMUSCULAR | Status: AC
  Filled 2013-08-15: qty 2

## 2013-08-15 MED ORDER — DEXAMETHASONE SODIUM PHOSPHATE 4 MG/ML IJ SOLN
INTRAMUSCULAR | Status: AC
  Filled 2013-08-15: qty 1

## 2013-08-15 MED ORDER — BUPIVACAINE HCL (PF) 0.5 % IJ SOLN
INTRAMUSCULAR | Status: DC | PRN
  Administered 2013-08-15: 5 mL

## 2013-08-15 MED ORDER — MIDAZOLAM HCL 2 MG/2ML IJ SOLN
1.0000 mg | INTRAMUSCULAR | Status: DC | PRN
Start: 2013-08-15 — End: 2013-08-15
  Administered 2013-08-15: 2 mg via INTRAVENOUS

## 2013-08-15 MED ORDER — SODIUM CHLORIDE 0.9 % IJ SOLN
INTRAMUSCULAR | Status: AC
  Filled 2013-08-15: qty 10

## 2013-08-15 MED ORDER — KETOROLAC TROMETHAMINE 30 MG/ML IJ SOLN
30.0000 mg | Freq: Once | INTRAMUSCULAR | Status: AC
Start: 2013-08-15 — End: 2013-08-15
  Administered 2013-08-15: 30 mg via INTRAVENOUS

## 2013-08-15 MED ORDER — PROPOFOL 10 MG/ML IV BOLUS
INTRAVENOUS | Status: DC | PRN
  Administered 2013-08-15: 150 mg via INTRAVENOUS

## 2013-08-15 MED ORDER — PROPOFOL 10 MG/ML IV EMUL
INTRAVENOUS | Status: AC
  Filled 2013-08-15: qty 20

## 2013-08-15 MED ORDER — ONDANSETRON HCL 4 MG/2ML IJ SOLN
4.0000 mg | Freq: Once | INTRAMUSCULAR | Status: AC
  Administered 2013-08-15: 4 mg via INTRAVENOUS

## 2013-08-15 MED ORDER — LACTATED RINGERS IV SOLN
INTRAVENOUS | Status: DC
  Administered 2013-08-15: 09:00:00 via INTRAVENOUS

## 2013-08-15 MED ORDER — FENTANYL CITRATE 0.05 MG/ML IJ SOLN
INTRAMUSCULAR | Status: AC
  Filled 2013-08-15: qty 5

## 2013-08-15 MED ORDER — LIDOCAINE HCL (PF) 1 % IJ SOLN
INTRAMUSCULAR | Status: AC
  Filled 2013-08-15: qty 5

## 2013-08-15 MED ORDER — LIDOCAINE HCL 1 % IJ SOLN
INTRAMUSCULAR | Status: DC | PRN
  Administered 2013-08-15: 30 mg via INTRADERMAL

## 2013-08-15 MED ORDER — ACETAMINOPHEN 500 MG PO TABS
1000.0000 mg | ORAL_TABLET | Freq: Four times a day (QID) | ORAL | Status: AC
  Administered 2013-08-15 – 2013-08-16 (×4): 1000 mg via ORAL
  Filled 2013-08-15 (×4): qty 2

## 2013-08-15 MED ORDER — GLYCOPYRROLATE 0.2 MG/ML IJ SOLN
INTRAMUSCULAR | Status: DC | PRN
  Administered 2013-08-15: .3 mg via INTRAVENOUS

## 2013-08-15 MED ORDER — MIDAZOLAM HCL 2 MG/2ML IJ SOLN
INTRAMUSCULAR | Status: AC
  Filled 2013-08-15: qty 2

## 2013-08-15 MED ORDER — FENTANYL CITRATE 0.05 MG/ML IJ SOLN
INTRAMUSCULAR | Status: AC
  Filled 2013-08-15: qty 2

## 2013-08-15 MED ORDER — NEOSTIGMINE METHYLSULFATE 10 MG/10ML IV SOLN
INTRAVENOUS | Status: AC
  Filled 2013-08-15: qty 1

## 2013-08-15 MED ORDER — ONDANSETRON HCL 4 MG/2ML IJ SOLN
4.0000 mg | Freq: Once | INTRAMUSCULAR | Status: DC | PRN
Start: 2013-08-15 — End: 2013-08-15

## 2013-08-15 MED ORDER — FENTANYL CITRATE 0.05 MG/ML IJ SOLN
25.0000 ug | INTRAMUSCULAR | Status: DC | PRN
  Administered 2013-08-15 (×2): 50 ug via INTRAVENOUS
  Filled 2013-08-15: qty 2

## 2013-08-15 MED ORDER — HEMOSTATIC AGENTS (NO CHARGE) OPTIME
TOPICAL | Status: DC | PRN
  Administered 2013-08-15: 1 via TOPICAL

## 2013-08-15 MED ORDER — ONDANSETRON HCL 4 MG/2ML IJ SOLN: 4.0000 mg | Freq: Four times a day (QID) | INTRAMUSCULAR | Status: DC | PRN

## 2013-08-15 MED ORDER — MENTHOL 3 MG MT LOZG
1.0000 | LOZENGE | OROMUCOSAL | Status: DC | PRN
  Administered 2013-08-16: 3 mg via ORAL
  Filled 2013-08-15 (×2): qty 9

## 2013-08-15 SURGICAL SUPPLY — 60 items
APPLIER CLIP 11 MED OPEN (CLIP)
APPLIER CLIP 9.375 SM OPEN (CLIP) ×4
ATTRACTOMAT 16X20 MAGNETIC DRP (DRAPES) ×2 IMPLANT
BAG HAMPER (MISCELLANEOUS) ×2 IMPLANT
BLADE 10 SAFETY STRL DISP (BLADE) IMPLANT
BLADE 15 SAFETY STRL DISP (BLADE) IMPLANT
CLIP APPLIE 11 MED OPEN (CLIP) IMPLANT
CLIP APPLIE 9.375 SM OPEN (CLIP) ×2 IMPLANT
CLOTH BEACON ORANGE TIMEOUT ST (SAFETY) ×2 IMPLANT
COVER LIGHT HANDLE STERIS (MISCELLANEOUS) ×4 IMPLANT
DERMABOND ADVANCED (GAUZE/BANDAGES/DRESSINGS) ×1
DERMABOND ADVANCED .7 DNX12 (GAUZE/BANDAGES/DRESSINGS) ×1 IMPLANT
DRAPE PED LAPAROTOMY (DRAPES) ×2 IMPLANT
DRAPE PROXIMA HALF (DRAPES) ×2 IMPLANT
DURAPREP 6ML APPLICATOR 50/CS (WOUND CARE) ×2 IMPLANT
ELECT NEEDLE TIP 2.8 STRL (NEEDLE) ×2 IMPLANT
ELECT REM PT RETURN 9FT ADLT (ELECTROSURGICAL) ×2
ELECTRODE REM PT RTRN 9FT ADLT (ELECTROSURGICAL) ×1 IMPLANT
FORMALIN 10 PREFIL 120ML (MISCELLANEOUS) ×2 IMPLANT
GAUZE SPONGE 4X4 16PLY XRAY LF (GAUZE/BANDAGES/DRESSINGS) ×10 IMPLANT
GLOVE BIO SURGEON STRL SZ 6.5 (GLOVE) ×2 IMPLANT
GLOVE BIOGEL PI IND STRL 7.0 (GLOVE) ×2 IMPLANT
GLOVE BIOGEL PI IND STRL 7.5 (GLOVE) ×1 IMPLANT
GLOVE BIOGEL PI INDICATOR 7.0 (GLOVE) ×2
GLOVE BIOGEL PI INDICATOR 7.5 (GLOVE) ×1
GLOVE ECLIPSE 6.5 STRL STRAW (GLOVE) ×2 IMPLANT
GLOVE SURG SS PI 7.5 STRL IVOR (GLOVE) ×4 IMPLANT
GOWN STRL REUS W/TWL LRG LVL3 (GOWN DISPOSABLE) ×6 IMPLANT
HEMOSTAT SURGICEL 2X3 (HEMOSTASIS) IMPLANT
HEMOSTAT SURGICEL 4X8 (HEMOSTASIS) ×2 IMPLANT
KIT BLADEGUARD II DBL (SET/KITS/TRAYS/PACK) ×2 IMPLANT
KIT ROOM TURNOVER APOR (KITS) ×2 IMPLANT
MANIFOLD NEPTUNE II (INSTRUMENTS) ×2 IMPLANT
MARKER SKIN DUAL TIP RULER LAB (MISCELLANEOUS) ×2 IMPLANT
NEEDLE HYPO 25X1 1.5 SAFETY (NEEDLE) ×2 IMPLANT
NS IRRIG 1000ML POUR BTL (IV SOLUTION) ×2 IMPLANT
PACK BASIC III (CUSTOM PROCEDURE TRAY) ×1
PACK SRG BSC III STRL LF ECLPS (CUSTOM PROCEDURE TRAY) ×1 IMPLANT
PAD ARMBOARD 7.5X6 YLW CONV (MISCELLANEOUS) ×2 IMPLANT
PENCIL HANDSWITCHING (ELECTRODE) ×2 IMPLANT
SET BASIN LINEN APH (SET/KITS/TRAYS/PACK) ×2 IMPLANT
SHEARS HARMONIC 9CM CVD (BLADE) ×2 IMPLANT
SPONGE DRAIN TRACH 4X4 STRL 2S (GAUZE/BANDAGES/DRESSINGS) ×2 IMPLANT
SPONGE INTESTINAL PEANUT (DISPOSABLE) ×10 IMPLANT
STAPLER VISISTAT (STAPLE) IMPLANT
SUT ETHILON 3 0 FSL (SUTURE) IMPLANT
SUT ETHILON 4 0 PS 2 18 (SUTURE) ×2 IMPLANT
SUT SILK 2 0 (SUTURE) ×1
SUT SILK 2-0 18XBRD TIE 12 (SUTURE) ×1 IMPLANT
SUT SILK 3 0 (SUTURE)
SUT SILK 3-0 18XBRD TIE 12 (SUTURE) IMPLANT
SUT VIC AB 2-0 CT2 27 (SUTURE) ×2 IMPLANT
SUT VIC AB 3-0 SH 27 (SUTURE) ×1
SUT VIC AB 3-0 SH 27X BRD (SUTURE) ×1 IMPLANT
SUT VIC AB 4-0 PS2 27 (SUTURE) ×2 IMPLANT
SYRINGE CONTROL L 12CC (SYRINGE) ×2 IMPLANT
SYSTEM CHEST DRAIN TLS 7FR (DRAIN) ×2 IMPLANT
TAPE CLOTH SURG 4X10 WHT LF (GAUZE/BANDAGES/DRESSINGS) ×2 IMPLANT
TOWEL OR 17X26 4PK STRL BLUE (TOWEL DISPOSABLE) IMPLANT
YANKAUER SUCT BULB TIP 10FT TU (MISCELLANEOUS) ×2 IMPLANT

## 2013-08-15 NOTE — Anesthesia Preprocedure Evaluation (Signed)
Anesthesia Evaluation  Patient identified by MRN, date of birth, ID band Patient awake    Reviewed: Allergy & Precautions, H&P , NPO status , Patient's Chart, lab work & pertinent test results  Airway Mallampati: I TM Distance: >3 FB     Dental   Pulmonary former smoker,  breath sounds clear to auscultation        Cardiovascular negative cardio ROS  Rhythm:Regular Rate:Normal     Neuro/Psych    GI/Hepatic negative GI ROS,   Endo/Other  Multinodular goiter  Renal/GU      Musculoskeletal   Abdominal   Peds  Hematology   Anesthesia Other Findings   Reproductive/Obstetrics                           Anesthesia Physical Anesthesia Plan  ASA: I  Anesthesia Plan: General   Post-op Pain Management:    Induction: Intravenous  Airway Management Planned: Oral ETT  Additional Equipment:   Intra-op Plan:   Post-operative Plan: Extubation in OR  Informed Consent: I have reviewed the patients History and Physical, chart, labs and discussed the procedure including the risks, benefits and alternatives for the proposed anesthesia with the patient or authorized representative who has indicated his/her understanding and acceptance.     Plan Discussed with:   Anesthesia Plan Comments:         Anesthesia Quick Evaluation

## 2013-08-15 NOTE — Anesthesia Procedure Notes (Signed)
Procedure Name: Intubation Date/Time: 08/15/2013 9:39 AM Performed by: Tressie Stalker E Pre-anesthesia Checklist: Patient identified, Patient being monitored, Timeout performed, Emergency Drugs available and Suction available Patient Re-evaluated:Patient Re-evaluated prior to inductionOxygen Delivery Method: Circle System Utilized Preoxygenation: Pre-oxygenation with 100% oxygen Intubation Type: IV induction Ventilation: Mask ventilation without difficulty Laryngoscope Size: Mac and 3 Grade View: Grade I Tube type: Oral Tube size: 7.0 mm Number of attempts: 1 Airway Equipment and Method: stylet Placement Confirmation: ETT inserted through vocal cords under direct vision,  positive ETCO2 and breath sounds checked- equal and bilateral Secured at: 21 cm Tube secured with: Tape Dental Injury: Teeth and Oropharynx as per pre-operative assessment

## 2013-08-15 NOTE — Op Note (Signed)
Patient:  [name]  DOB:  11-Jun-1960  MRN:  [id number]   Preop Diagnosis:  Multinodular goiter  Postop Diagnosis:  Same  Procedure:  Total thyroidectomy  Surgeon:  Aviva Signs, M.D.  Anes:  General endotracheal  Indications:  Patient is a 53 year old white female who has an enlarging symptomatic multinodular goiter. The risks and benefits of the procedure including bleeding, infection, nerve injury, and the possibility of voice changes were fully explained to the patient, who gave informed consent.  Procedure note:  Patient is placed the supine position. After induction of general endotracheal anesthesia, the neck was prepped and draped using usual sterile technique with DuraPrep. Surgical site confirmation was performed.  A transverse incision was made just above the jugular notch. The platysma was divided using Bovie electrocautery. The strap muscles were then incised longitudinally and retracted laterally. First turned my attention to the left lobe of thyroid gland. This was smaller than the right lobe. Multiple nodules were noted within it. The inferior thyroidal artery and vein as well as the middle thyroidal artery and vein suspensory ligament of Alvester Chou were all divided using either the harmonic scalpel or small clips. Care was taken to avoid the left recurrent laryngeal nerve, which was identified. The left thyroid gland along with the isthmus was then removed from the operative field and sent to pathology further examination. The right lobe of the thyroid gland is significantly larger. One cyst had to be ruptured in order to facilitate exposure. The middle thyroidal artery and vein as well as the inferior thyroidal artery and vein as well as suspensory ligament of Alvester Chou were all divided using the harmonic scalpel or small clips. Again, the right recurrent laryngeal nerve was noted to be intact. The right thyroid lobe was sent to pathology further examination. Any bleeding was  controlled using small clips. Surgicel was placed in both thyroid beds. A #5 round Jackson-Pratt drain was then placed into the thyroid beds and brought through separate stab wound lateral to the incision line. It was then attached to a Vacutainer. The strap muscles were then reapproximated along the midline using a 2-0 Vicryl running suture. The platysma was reapproximated using 3-0 Vicryl running suture. The skin was closed a 4-0 Vicryl subcuticular suture. Dermabond was then applied. 0.5% Sensorcaine was instilled the surrounding wound.  All tape and needle counts were correct at the end of the procedure. Patient was extubated in the operating room and transferred to PACU in stable condition. She was able to phonate the letter e without difficulty.  Complications:  None  EBL:  200 cc  Specimen:  Left thyroid lobe and isthmus, right thyroid lobe

## 2013-08-15 NOTE — Progress Notes (Signed)
Patient had initially refused humidified air, when I assessed the patient she complained of a sore throat and requested the humidified air, I notified RT, they will come and set it up.

## 2013-08-15 NOTE — Interval H&P Note (Signed)
History and Physical Interval Note:  08/15/2013 9:11 AM  [name]  has presented today for surgery, with the diagnosis of multi-nodular goiter  The various methods of treatment have been discussed with the patient and family. After consideration of risks, benefits and other options for treatment, the patient has consented to  Procedure(s): TOTAL THYROIDECTOMY (N/A) as a surgical intervention .  The patient's history has been reviewed, patient examined, no change in status, stable for surgery.  I have reviewed the patient's chart and labs.  Questions were answered to the patient's satisfaction.     Aviva Signs A

## 2013-08-15 NOTE — Anesthesia Postprocedure Evaluation (Signed)
  Anesthesia Post-op Note  Patient: [name]  Procedure(s) Performed: Procedure(s): TOTAL THYROIDECTOMY (N/A)  Patient Location: PACU  Anesthesia Type:General  Level of Consciousness: awake, alert  and oriented  Airway and Oxygen Therapy: Patient Spontanous Breathing and Patient connected to tracheostomy mask oxygen  Post-op Pain: mild  Post-op Assessment: Post-op Vital signs reviewed, Patient's Cardiovascular Status Stable, Respiratory Function Stable, Patent Airway and No signs of Nausea or vomiting  Post-op Vital Signs: Reviewed and stable  Last Vitals:  Filed Vitals:   08/15/13 0920  BP: 97/60  Pulse:   Temp:   Resp: 20    Complications: No apparent anesthesia complications

## 2013-08-15 NOTE — Progress Notes (Signed)
UR review complete.  

## 2013-08-15 NOTE — Transfer of Care (Signed)
Immediate Anesthesia Transfer of Care Note  Patient: [name]  Procedure(s) Performed: Procedure(s): TOTAL THYROIDECTOMY (N/A)  Patient Location: PACU  Anesthesia Type:General  Level of Consciousness: awake, alert  and oriented  Airway & Oxygen Therapy: Patient Spontanous Breathing and Patient connected to face mask oxygen  Post-op Assessment: Report given to PACU RN  Post vital signs: Reviewed and stable  Complications: No apparent anesthesia complications

## 2013-08-16 DIAGNOSIS — E042 Nontoxic multinodular goiter: Secondary | ICD-10-CM | POA: Diagnosis not present

## 2013-08-16 LAB — COMPREHENSIVE METABOLIC PANEL
ALT: 22 U/L (ref 0–35)
AST: 17 U/L (ref 0–37)
Albumin: 3.2 g/dL — ABNORMAL LOW (ref 3.5–5.2)
Alkaline Phosphatase: 47 U/L (ref 39–117)
Anion gap: 10 (ref 5–15)
BUN: 11 mg/dL (ref 6–23)
CALCIUM: 8.3 mg/dL — AB (ref 8.4–10.5)
CO2: 25 meq/L (ref 19–32)
Chloride: 106 mEq/L (ref 96–112)
Creatinine, Ser: 0.59 mg/dL (ref 0.50–1.10)
GFR calc Af Amer: >90 mL/min (ref >90)
GFR calc non Af Amer: >90 mL/min (ref >90)
Glucose, Bld: 94 mg/dL (ref 70–99)
Potassium: 4 mEq/L (ref 3.7–5.3)
Sodium: 141 mEq/L (ref 137–147)
Total Bilirubin: 0.3 mg/dL (ref 0.3–1.2)
Total Protein: 5.9 g/dL — ABNORMAL LOW (ref 6.0–8.3)

## 2013-08-16 LAB — CBC
HCT: 35 % — ABNORMAL LOW (ref 36.0–46.0)
Hemoglobin: 11.7 g/dL — ABNORMAL LOW (ref 12.0–15.0)
MCH: 29.8 pg (ref 26.0–34.0)
MCHC: 33.4 g/dL (ref 30.0–36.0)
MCV: 89.3 fL (ref 78.0–100.0)
Platelets: 272 10*3/uL (ref 150–400)
RBC: 3.92 MIL/uL (ref 3.87–5.11)
RDW: 13.4 % (ref 11.5–15.5)
WBC: 11.3 10*3/uL — AB (ref 4.0–10.5)

## 2013-08-16 MED ORDER — POLYETHYLENE GLYCOL 3350 17 G PO PACK
17.0000 g | PACK | Freq: Two times a day (BID) | ORAL | Status: DC | PRN
  Administered 2013-08-16: 17 g via ORAL
  Filled 2013-08-16: qty 1

## 2013-08-16 MED ORDER — SODIUM CHLORIDE 0.9 % IV BOLUS (SEPSIS)
500.0000 mL | Freq: Once | INTRAVENOUS | Status: AC
  Administered 2013-08-16: 500 mL via INTRAVENOUS

## 2013-08-16 MED ORDER — MAGNESIUM HYDROXIDE 400 MG/5ML PO SUSP
15.0000 mL | Freq: Four times a day (QID) | ORAL | Status: DC | PRN
  Administered 2013-08-16 (×2): 15 mL via ORAL
  Filled 2013-08-16 (×2): qty 30

## 2013-08-16 MED ORDER — SODIUM CHLORIDE 0.9 % IV SOLN: INTRAVENOUS | Status: DC

## 2013-08-16 MED ORDER — LACTATED RINGERS IV SOLN
INTRAVENOUS | Status: DC
  Administered 2013-08-16: 09:00:00 via INTRAVENOUS

## 2013-08-16 NOTE — Progress Notes (Signed)
Patient is POD 1 for a total thyroidectomy, her BP has been running soft. Paged to notify the on-call physician. Will continue to monitor. Physician called back will follow orders given.

## 2013-08-16 NOTE — Progress Notes (Signed)
Physician called back no new orders given at this time.

## 2013-08-16 NOTE — Plan of Care (Signed)
Problem: Phase I Progression Outcomes Goal: OOB as tolerated unless otherwise ordered Outcome: Progressing 08/16/13 1117 Patient ambulates in room and hallway, tolerates well. Denies dizziness/weakness this am. Encouraged to call for assistance as needed. Donavan Foil, RN

## 2013-08-16 NOTE — Addendum Note (Signed)
Addendum created 08/16/13 2051 by Mickel Baas, CRNA   Modules edited: Notes Section   Notes Section:  File: [id number]

## 2013-08-16 NOTE — Anesthesia Postprocedure Evaluation (Signed)
  Anesthesia Post-op Note  Patient: [name]  Procedure(s) Performed: Procedure(s): TOTAL THYROIDECTOMY (N/A)  Patient Location: Room 313  Anesthesia Type:General  Level of Consciousness: awake, alert , oriented and patient cooperative  Airway and Oxygen Therapy: Patient Spontanous Breathing  Post-op Pain: mild  Post-op Assessment: Post-op Vital signs reviewed, Patient's Cardiovascular Status Stable, Respiratory Function Stable, Patent Airway, No signs of Nausea or vomiting and Pain level controlled  Post-op Vital Signs: Reviewed and stable  Last Vitals:  Filed Vitals:   08/16/13 1318  BP: 94/54  Pulse: 71  Temp: 37.2 C  Resp: 16    Complications: No apparent anesthesia complications

## 2013-08-16 NOTE — Progress Notes (Signed)
Paged on-call physician to notify him of the patients BP after she refused a 500 cc bolus.

## 2013-08-16 NOTE — Progress Notes (Signed)
1 Day Post-Op  Subjective: Feels a little fatigued, but better after the fluid bolus. No incisional pain.  Objective: Vital signs in last 24 hours: Temp:  [97.8 F (36.6 C)-99.1 F (37.3 C)] 98.5 F (36.9 C) (08/15 0642) Pulse Rate:  [62-83] 63 (08/15 0642) Resp:  [12-32] 18 (08/15 0642) BP: (84-118)/(53-73) 92/54 mmHg (08/15 0826) SpO2:  [93 %-100 %] 94 % (08/15 0642) FiO2 (%):  [28 %] 28 % (08/14 2107) Weight:  [73.029 kg (161 lb)] 73.029 kg (161 lb) (08/14 1307) Last BM Date: 08/14/13  Intake/Output from previous day: 08/14 0701 - 08/15 0700 In: 1940 [P.O.:240; I.V.:1200; IV Piggyback:500] Out: 200 [Blood:200] Intake/Output this shift:    General appearance: alert, cooperative and no distress Neck: Incision healing well. Minimal swelling present. JP drain in place. Resp: clear to auscultation bilaterally Cardio: regular rate and rhythm, S1, S2 normal, no murmur, click, rub or gallop  Lab Results:   Recent Labs  08/16/13 0547  WBC 11.3*  HGB 11.7*  HCT 35.0*  PLT 272   BMET  Recent Labs  08/15/13 1417 08/16/13 0547  NA 140 141  K 4.3 4.0  CL 102 106  CO2 24 25  GLUCOSE 182* 94  BUN 11 11  CREATININE 0.68 0.59  CALCIUM 8.8 8.3*   PT/INR No results found for this basename: LABPROT, INR,  in the last 72 hours  Studies/Results: No results found.  Anti-infectives: Anti-infectives   Start     Dose/Rate Route Frequency Ordered Stop   08/15/13 0849  ceFAZolin (ANCEF) IVPB 2 g/50 mL premix     2 g 100 mL/hr over 30 Minutes Intravenous On call to O.R. 08/15/13 1975 08/15/13 0956      Assessment/Plan: s/p Procedure(s): TOTAL THYROIDECTOMY Impression: Stable on postoperative day one. I suspect patient has a low normal blood pressure to 2 anesthesia. Will keep in the hospital for another 24 hours do to her fatigue and monitor her blood pressure. Calcium levels within normal limits.  LOS: 1 day    Taccara Bushnell A 08/16/2013

## 2013-08-17 DIAGNOSIS — E042 Nontoxic multinodular goiter: Secondary | ICD-10-CM | POA: Diagnosis not present

## 2013-08-17 MED ORDER — CALCIUM-VITAMIN D 250-125 MG-UNIT PO TABS: 1.0000 | ORAL_TABLET | Freq: Two times a day (BID) | ORAL | Status: DC

## 2013-08-17 MED ORDER — LEVOTHYROXINE SODIUM 100 MCG PO TABS: 100.0000 ug | ORAL_TABLET | Freq: Every day | ORAL | Status: DC

## 2013-08-17 MED ORDER — HYDROCODONE-ACETAMINOPHEN 5-325 MG PO TABS: 1.0000 | ORAL_TABLET | ORAL | Status: DC | PRN

## 2013-08-17 NOTE — Discharge Instructions (Signed)
Thyroidectomy °Care After °Refer to this sheet in the next few weeks. These instructions provide you with general information on caring for yourself after you leave the hospital. Your caregiver also may give you specific instructions. Your treatment has been planned according to the most current medical practices available, but problems sometimes occur. Call your caregiver if you have any problems or questions after your procedure. °HOME CARE INSTRUCTIONS  °· It is normal to be sore for a few weeks following surgery. See your caregiver if your pain seems to be getting worse rather than better. °· Only take over-the-counter or prescription medicines for pain, discomfort, or fever as directed by your caregiver. Avoid taking medicines that contain aspirin and ibuprofen because they increase the risk of bleeding. °· Shower rather than bathe until instructed otherwise by your caregiver. °· Change your bandages (dressings) as directed by your caregiver. °· You may resume a normal diet and activities as directed by your caregiver. °· Avoid lifting weight greater than 20 lb (9 kg) or participating in heavy exercise or contact sports for 10 days or as instructed by your caregiver. °· Make an appointment to see your caregiver for stitch (suture) or staple removal. °SEEK MEDICAL CARE IF:  °· You have increased bleeding from your wound. °· You have redness, swelling, or increasing pain from your wound or in your neck. °· There is pus coming from your wound. °· You have an oral temperature above 102° F (38.9° C). °· There is a bad smell coming from the wound or dressing. °· You develop lightheadedness or feel faint. °· You develop numbness, tingling, or muscle spasms in your arms, hands, feet, or face. °· You have difficulty swallowing. °SEEK IMMEDIATE MEDICAL CARE IF:  °· You develop a rash. °· You have difficulty breathing. °· You hear whistling noises that come from your chest. °· You develop a cough that becomes increasingly  worse. °· You develop any reaction or side effects to medicines given. °· There is swelling in your neck. °· You develop changes in speech or hoarseness, which is getting worse. °MAKE SURE YOU:  °· Understand these instructions. °· Will watch your condition. °· Will get help right away if you are not doing well or get worse. °Document Released: 07/08/2004 Document Revised: 03/13/2011 Document Reviewed: 02/25/2010 °ExitCare® Patient Information ©2015 [employer]. This information is not intended to replace advice given to you by your health care provider. Make sure you discuss any questions you have with your health care provider. ° °

## 2013-08-17 NOTE — Progress Notes (Signed)
Pt given AVS handout and discharge instructions. Pt verbalized understanding and stated that she has no further questions. Family at bedside during discharge. Pt IV was dc'd; tolerated well. Pt was in stable condition prior to discharge and was escorted via wheelchair with NT and family.

## 2013-08-17 NOTE — Discharge Summary (Signed)
Physician Discharge Summary  Patient ID: [name] MRN: [id number] DOB/AGE: December 06, 1960 53 y.o.  Admit date: 08/15/2013 Discharge date: 08/17/2013  Admission Diagnoses: Multinodular goiter  Discharge Diagnoses: Same Active Problems:   Multinodular goiter   S/P thyroidectomy   Discharged Condition: good  Hospital Course:  Patient is a 53 year old white female with a history of multinodular goiter who underwent a total thyroidectomy on 08/15/2013 patient tolerated procedure well. Her postoperative course was remarkable only for constipation. She has had no significant voice changes. Calcium level remained within normal limits. She was started on Synthroid. She is being discharged home on 08/17/2013 in good improving condition.  Treatments: surgery: Total thyroidectomy on 08/15/2013  Discharge Exam: Blood pressure 96/63, pulse 63, temperature 98.7 F (37.1 C), temperature source Oral, resp. rate 18, height 5\' 4"  (1.626 m), weight 73.029 kg (161 lb), SpO2 95.00%. General appearance: alert, cooperative and no distress Neck: Incision healing well. No significant swelling or ecchymosis noted. JP drain removed. Resp: clear to auscultation bilaterally Cardio: regular rate and rhythm, S1, S2 normal, no murmur, click, rub or gallop  Disposition:  home     Medication List         calcium-vitamin D 250-125 MG-UNIT per tablet  Commonly known as:  OSCAL  Take 1 tablet by mouth 2 (two) times daily.     diclofenac 75 MG EC tablet  Commonly known as:  VOLTAREN  Take 75 mg by mouth daily.     HYDROcodone-acetaminophen 5-325 MG per tablet  Commonly known as:  NORCO  Take 1 tablet by mouth every 4 (four) hours as needed for moderate pain.     levothyroxine 100 MCG tablet  Commonly known as:  SYNTHROID, LEVOTHROID  Take 1 tablet (100 mcg total) by mouth daily before breakfast.     multivitamins ther. w/minerals Tabs tablet  Take 1 tablet by mouth daily.           Follow-up  Information   Follow up with Jamesetta So, MD. Schedule an appointment as soon as possible for a visit on 08/21/2013.   Specialty:  General Surgery   Contact information:   [address] [address] [phone]       Signed: Aviva Signs A 08/17/2013, 11:03 AM

## 2013-08-18 ENCOUNTER — Encounter (HOSPITAL_COMMUNITY): Payer: Self-pay | Admitting: General Surgery

## 2014-07-29 ENCOUNTER — Other Ambulatory Visit (HOSPITAL_COMMUNITY): Payer: Self-pay | Admitting: Family Medicine

## 2014-07-29 DIAGNOSIS — Z1231 Encounter for screening mammogram for malignant neoplasm of breast: Secondary | ICD-10-CM

## 2014-08-03 ENCOUNTER — Ambulatory Visit (HOSPITAL_COMMUNITY)
Admission: RE | Admit: 2014-08-03 | Discharge: 2014-08-03 | Disposition: A | Discharge status: Home / Self Care | Payer: Medicaid Other | Source: Ambulatory Visit | Attending: Family Medicine | Admitting: Family Medicine

## 2014-08-03 DIAGNOSIS — Z1231 Encounter for screening mammogram for malignant neoplasm of breast: Secondary | ICD-10-CM

## 2014-12-09 ENCOUNTER — Telehealth: Payer: Self-pay

## 2014-12-09 NOTE — Telephone Encounter (Signed)
LMOM that I will try back again to call her.

## 2014-12-09 NOTE — Telephone Encounter (Signed)
PATIENT RECEIVED LETTER TO SCHEDULE TCS [phone]

## 2014-12-10 ENCOUNTER — Telehealth: Payer: Self-pay

## 2014-12-10 NOTE — Telephone Encounter (Signed)
Triaged today.  

## 2014-12-10 NOTE — Telephone Encounter (Signed)
Pt was returning a call from DS to set up a colonoscopy. [id number]

## 2014-12-10 NOTE — Telephone Encounter (Signed)
See separate triage.  

## 2014-12-15 ENCOUNTER — Other Ambulatory Visit: Payer: Self-pay

## 2014-12-15 DIAGNOSIS — Z1211 Encounter for screening for malignant neoplasm of colon: Secondary | ICD-10-CM

## 2014-12-15 NOTE — Telephone Encounter (Signed)
PREPOPIK-DRINK WATER TO KEEP URINE LIGHT YELLOW. Clear liquids with breakfast.

## 2014-12-15 NOTE — Telephone Encounter (Signed)
Gastroenterology Pre-Procedure Review  Request Date: 12/10/2014 Requesting Physician: Dr. Cindie Laroche  PATIENT REVIEW QUESTIONS: The patient responded to the following health history questions as indicated:    1. Diabetes Melitis: no 2. Joint replacements in the past 12 months: no 3. Major health problems in the past 3 months: no 4. Has an artificial valve or MVP: no 5. Has a defibrillator: no 6. Has been advised in past to take antibiotics in advance of a procedure like teeth cleaning: no 7. Family history of colon cancer: no  8. Alcohol Use: no 9. History of sleep apnea: no     MEDICATIONS & ALLERGIES:    Patient reports the following regarding taking any blood thinners:   Plavix? no Aspirin? no Coumadin? no  Patient confirms/reports the following medications:  Current Outpatient Prescriptions  Medication Sig Dispense Refill  . calcium-vitamin D (OSCAL) 250-125 MG-UNIT per tablet Take 1 tablet by mouth 2 (two) times daily. 60 tablet 1  . diclofenac (VOLTAREN) 75 MG EC tablet Take 75 mg by mouth daily.    Marland Kitchen levothyroxine (SYNTHROID, LEVOTHROID) 100 MCG tablet Take 1 tablet (100 mcg total) by mouth daily before breakfast. 60 tablet 1  . Multiple Vitamins-Minerals (MULTIVITAMINS THER. W/MINERALS) TABS tablet Take 1 tablet by mouth daily.    Marland Kitchen HYDROcodone-acetaminophen (NORCO) 5-325 MG per tablet Take 1 tablet by mouth every 4 (four) hours as needed for moderate pain. (Patient not taking: Reported on 12/10/2014) 30 tablet 0   No current facility-administered medications for this visit.    Patient confirms/reports the following allergies:  No Known Allergies  No orders of the defined types were placed in this encounter.    AUTHORIZATION INFORMATION Primary Insurance:   ID #:  Group #:  Pre-Cert / Auth required:  Pre-Cert / Auth #:   Secondary Insurance:   ID #:   Group #:  Pre-Cert / Auth required: Pre-Cert / Auth #:   SCHEDULE INFORMATION: Procedure has been scheduled as  follows:  Date: 01/01/2015           Time: 11:15  Location: [facility] Short Stay  This Gastroenterology Pre-Precedure Review Form is being routed to the following provider(s): Barney Drain, MD

## 2014-12-17 MED ORDER — SOD PICOSULFATE-MAG OX-CIT ACD 10-3.5-12 MG-GM-GM PO PACK: 1.0000 | PACK | ORAL | Status: DC

## 2014-12-17 NOTE — Addendum Note (Signed)
Addended by: Everardo All on: 12/17/2014 10:24 AM   Modules accepted: Orders

## 2014-12-17 NOTE — Telephone Encounter (Signed)
Rx sent to the pharmacy and instructions mailed to pt.  

## 2014-12-29 ENCOUNTER — Telehealth: Payer: Self-pay

## 2014-12-29 NOTE — Telephone Encounter (Signed)
I went on line and CPT code [id number] does not exist, therefore, No PA required for the screening colonoscopy.

## 2015-01-01 ENCOUNTER — Ambulatory Visit (HOSPITAL_COMMUNITY)
Admission: RE | Admit: 2015-01-01 | Discharge: 2015-01-01 | Disposition: A | Discharge status: Home / Self Care | Payer: Medicaid Other | Source: Ambulatory Visit | Attending: Gastroenterology | Admitting: Gastroenterology

## 2015-01-01 ENCOUNTER — Encounter (HOSPITAL_COMMUNITY): Payer: Self-pay | Admitting: *Deleted

## 2015-01-01 ENCOUNTER — Encounter (HOSPITAL_COMMUNITY)
Admission: RE | Disposition: A | Discharge status: Home / Self Care | Payer: Self-pay | Source: Ambulatory Visit | Attending: Gastroenterology

## 2015-01-01 DIAGNOSIS — Z1211 Encounter for screening for malignant neoplasm of colon: Secondary | ICD-10-CM | POA: Diagnosis not present

## 2015-01-01 DIAGNOSIS — D123 Benign neoplasm of transverse colon: Secondary | ICD-10-CM | POA: Diagnosis not present

## 2015-01-01 DIAGNOSIS — Z87891 Personal history of nicotine dependence: Secondary | ICD-10-CM | POA: Insufficient documentation

## 2015-01-01 DIAGNOSIS — D125 Benign neoplasm of sigmoid colon: Secondary | ICD-10-CM

## 2015-01-01 DIAGNOSIS — K648 Other hemorrhoids: Secondary | ICD-10-CM | POA: Insufficient documentation

## 2015-01-01 DIAGNOSIS — Z791 Long term (current) use of non-steroidal anti-inflammatories (NSAID): Secondary | ICD-10-CM | POA: Diagnosis not present

## 2015-01-01 DIAGNOSIS — M1991 Primary osteoarthritis, unspecified site: Secondary | ICD-10-CM | POA: Diagnosis not present

## 2015-01-01 HISTORY — PX: COLONOSCOPY: SHX5424

## 2015-01-01 SURGERY — COLONOSCOPY
Anesthesia: Moderate Sedation

## 2015-01-01 MED ORDER — MEPERIDINE HCL 100 MG/ML IJ SOLN
INTRAMUSCULAR | Status: DC | PRN
  Administered 2015-01-01: 25 mg via INTRAVENOUS
  Administered 2015-01-01: 50 mg via INTRAVENOUS

## 2015-01-01 MED ORDER — MIDAZOLAM HCL 5 MG/5ML IJ SOLN
INTRAMUSCULAR | Status: AC
  Filled 2015-01-01: qty 10

## 2015-01-01 MED ORDER — STERILE WATER FOR IRRIGATION IR SOLN
Status: DC | PRN
  Administered 2015-01-01: 12:00:00

## 2015-01-01 MED ORDER — MIDAZOLAM HCL 5 MG/5ML IJ SOLN
INTRAMUSCULAR | Status: DC | PRN
  Administered 2015-01-01 (×2): 2 mg via INTRAVENOUS

## 2015-01-01 MED ORDER — MEPERIDINE HCL 100 MG/ML IJ SOLN
INTRAMUSCULAR | Status: AC
  Filled 2015-01-01: qty 2

## 2015-01-01 MED ORDER — SODIUM CHLORIDE 0.9 % IV SOLN
INTRAVENOUS | Status: DC
  Administered 2015-01-01: 12:00:00 via INTRAVENOUS

## 2015-01-01 NOTE — H&P (Signed)
  Primary Care Physician:  Maricela Curet, MD Primary Gastroenterologist:  Dr. Oneida Alar  Pre-Procedure History & Physical: HPI:  [name] is a 54 y.o. female here for [place].  Past Medical History  Diagnosis Date  . Arthritis     Past Surgical History  Procedure Laterality Date  . Appendectomy    . Thyroidectomy N/A 08/15/2013    Procedure: TOTAL THYROIDECTOMY;  Surgeon: Jamesetta So, MD;  Location: [facility];  Service: General;  Laterality: N/A;  . Bilateral bunionectomy      Prior to Admission medications   Medication Sig Start Date End Date Taking? Authorizing Provider  calcium-vitamin D (OSCAL) 250-125 MG-UNIT per tablet Take 1 tablet by mouth 2 (two) times daily. 08/17/13  Yes Aviva Signs, MD  diclofenac (VOLTAREN) 75 MG EC tablet Take 75 mg by mouth daily.   Yes Historical Provider, MD  levothyroxine (SYNTHROID, LEVOTHROID) 100 MCG tablet Take 1 tablet (100 mcg total) by mouth daily before breakfast. 08/17/13  Yes Aviva Signs, MD  Multiple Vitamins-Minerals (MULTIVITAMINS THER. W/MINERALS) TABS tablet Take 1 tablet by mouth daily.   Yes Historical Provider, MD  Sod Picosulfate-Mag Ox-Cit Acd 10-3.5-12 MG-GM-GM PACK Take 1 Container by mouth as directed. 12/17/14  Yes Danie Binder, MD    Allergies as of 12/15/2014  . (No Known Allergies)    History reviewed. No pertinent family history.  Social History   Social History  . Marital Status: Single    Spouse Name: N/A  . Number of Children: N/A  . Years of Education: N/A   Occupational History  . Not on file.   Social History Main Topics  . Smoking status: Former Smoker -- 0.25 packs/day for 10 years    Types: Cigarettes    Quit date: 08/11/2012  . Smokeless tobacco: Not on file  . Alcohol Use: No  . Drug Use: No  . Sexual Activity: Yes    Birth Control/ Protection: None   Other Topics Concern  . Not on file   Social History Narrative    Review of Systems: See HPI, otherwise  negative ROS   Physical Exam: BP 104/57 mmHg  Pulse 83  Temp(Src) 97.8 F (36.6 C) (Oral)  Resp 13  SpO2 98%  LMP 01/11/2013 General:   Alert,  pleasant and cooperative in NAD Head:  Normocephalic and atraumatic. Neck:  Supple; Lungs:  Clear throughout to auscultation.    Heart:  Regular rate and rhythm. Abdomen:  Soft, nontender and nondistended. Normal bowel sounds, without guarding, and without rebound.   Neurologic:  Alert and  oriented x4;  grossly normal neurologically.  Impression/Plan:     SCREENING  Plan:  1. TCS TODAY

## 2015-01-01 NOTE — Discharge Instructions (Signed)
You had 1 polyp removed. You have internal hemorrhoids.   DRINK WATER TO KEEP YOUR URINE LIGHT YELLOW.  FOLLOW A HIGH FIBER DIET. AVOID ITEMS THAT CAUSE BLOATING & GAS. SEE INFO BELOW.  USE PREPARATION H TIMES A DAY FOR 7 DAYS TO RELIEVE RECTAL PRESSURE/ITCHING/BLEEDING.  YOUR BIOPSY RESULTS WILL BE AVAILABLE IN MY CHART JAN 3 AND MY OFFICE WILL CONTACT YOU IN 10-14 DAYS WITH YOUR RESULTS.   Next colonoscopy in 5-10 years.   Colonoscopy Care After Read the instructions outlined below and refer to this sheet in the next week. These discharge instructions provide you with general information on caring for yourself after you leave the hospital. While your treatment has been planned according to the most current medical practices available, unavoidable complications occasionally occur. If you have any problems or questions after discharge, call DR. Rhyli Depaula, [phone].  ACTIVITY  You may resume your regular activity, but move at a slower pace for the next 24 hours.   Take frequent rest periods for the next 24 hours.   Walking will help get rid of the air and reduce the bloated feeling in your belly (abdomen).   No driving for 24 hours (because of the medicine (anesthesia) used during the test).   You may shower.   Do not sign any important legal documents or operate any machinery for 24 hours (because of the anesthesia used during the test).    NUTRITION  Drink plenty of fluids.   You may resume your normal diet as instructed by your doctor.   Begin with a light meal and progress to your normal diet. Heavy or fried foods are harder to digest and may make you feel sick to your stomach (nauseated).   Avoid alcoholic beverages for 24 hours or as instructed.    MEDICATIONS  You may resume your normal medications.   WHAT YOU CAN EXPECT TODAY  Some feelings of bloating in the abdomen.   Passage of more gas than usual.   Spotting of blood in your stool or on the toilet  paper  .  IF YOU HAD POLYPS REMOVED DURING THE COLONOSCOPY:  Eat a soft diet IF YOU HAVE NAUSEA, BLOATING, ABDOMINAL PAIN, OR VOMITING.    FINDING OUT THE RESULTS OF YOUR TEST Not all test results are available during your visit. DR. Oneida Alar WILL CALL YOU WITHIN 14 DAYS OF YOUR PROCEDUE WITH YOUR RESULTS. Do not assume everything is normal if you have not heard from DR. Selestino Nila, CALL HER OFFICE AT [phone].  SEEK IMMEDIATE MEDICAL ATTENTION AND CALL THE OFFICE: [phone] IF:  You have more than a spotting of blood in your stool.   Your belly is swollen (abdominal distention).   You are nauseated or vomiting.   You have a temperature over 101F.   You have abdominal pain or discomfort that is severe or gets worse throughout the day.   High-Fiber Diet A high-fiber diet changes your normal diet to include more whole grains, legumes, fruits, and vegetables. Changes in the diet involve replacing refined carbohydrates with unrefined foods. The calorie level of the diet is essentially unchanged. The Dietary Reference Intake (recommended amount) for adult males is 38 grams per day. For adult females, it is 25 grams per day. Pregnant and lactating women should consume 28 grams of fiber per day. Fiber is the intact part of a plant that is not broken down during digestion. Functional fiber is fiber that has been isolated from the plant to provide a beneficial effect  in the body. PURPOSE  Increase stool bulk.   Ease and regulate bowel movements.   Lower cholesterol.  REDUCE RISK OF COLON CANCER  INDICATIONS THAT YOU NEED MORE FIBER  Constipation and hemorrhoids.   Uncomplicated diverticulosis (intestine condition) and irritable bowel syndrome.   Weight management.   As a protective measure against hardening of the arteries (atherosclerosis), diabetes, and cancer.   GUIDELINES FOR INCREASING FIBER IN THE DIET  Start adding fiber to the diet slowly. A gradual increase of about 5  more grams (2 slices of whole-wheat bread, 2 servings of most fruits or vegetables, or 1 bowl of high-fiber cereal) per day is best. Too rapid an increase in fiber may result in constipation, flatulence, and bloating.   Drink enough water and fluids to keep your urine clear or pale yellow. Water, juice, or caffeine-free drinks are recommended. Not drinking enough fluid may cause constipation.   Eat a variety of high-fiber foods rather than one type of fiber.   Try to increase your intake of fiber through using high-fiber foods rather than fiber pills or supplements that contain small amounts of fiber.   The goal is to change the types of food eaten. Do not supplement your present diet with high-fiber foods, but replace foods in your present diet.   INCLUDE A VARIETY OF FIBER SOURCES  Replace refined and processed grains with whole grains, canned fruits with fresh fruits, and incorporate other fiber sources. White rice, white breads, and most bakery goods contain little or no fiber.   Brown whole-grain rice, buckwheat oats, and many fruits and vegetables are all good sources of fiber. These include: broccoli, Brussels sprouts, cabbage, cauliflower, beets, sweet potatoes, white potatoes (skin on), carrots, tomatoes, eggplant, squash, berries, fresh fruits, and dried fruits.   Cereals appear to be the richest source of fiber. Cereal fiber is found in whole grains and bran. Bran is the fiber-rich outer coat of cereal grain, which is largely removed in refining. In whole-grain cereals, the bran remains. In breakfast cereals, the largest amount of fiber is found in those with "bran" in their names. The fiber content is sometimes indicated on the label.   You may need to include additional fruits and vegetables each day.   In baking, for 1 cup white flour, you may use the following substitutions:   1 cup whole-wheat flour minus 2 tablespoons.   1/2 cup white flour plus 1/2 cup whole-wheat flour.    Polyps, Colon  A polyp is extra tissue that grows inside your body. Colon polyps grow in the large intestine. The large intestine, also called the colon, is part of your digestive system. It is a long, hollow tube at the end of your digestive tract where your body makes and stores stool. Most polyps are not dangerous. They are benign. This means they are not cancerous. But over time, some types of polyps can turn into cancer. Polyps that are smaller than a pea are usually not harmful. But larger polyps could someday become or may already be cancerous. To be safe, doctors remove all polyps and test them.   PREVENTION There is not one sure way to prevent polyps. You might be able to lower your risk of getting them if you:  Eat more fruits and vegetables and less fatty food.   Do not smoke.   Avoid alcohol.   Exercise every day.   Lose weight if you are overweight.   Eating more calcium and folate can also lower your  risk of getting polyps. Some foods that are rich in calcium are milk, cheese, and broccoli. Some foods that are rich in folate are chickpeas, kidney beans, and spinach.   Hemorrhoids Hemorrhoids are dilated (enlarged) veins around the rectum. Sometimes clots will form in the veins. This makes them swollen and painful. These are called thrombosed hemorrhoids. Causes of hemorrhoids include:  Constipation.   Straining to have a bowel movement.   HEAVY LIFTING  HOME CARE INSTRUCTIONS  Eat a well balanced diet and drink 6 to 8 glasses of water every day to avoid constipation. You may also use a bulk laxative.   Avoid straining to have bowel movements.   Keep anal area dry and clean.   Do not use a donut shaped pillow or sit on the toilet for long periods. This increases blood pooling and pain.   Move your bowels when your body has the urge; this will require less straining and will decrease pain and pressure.

## 2015-01-01 NOTE — Op Note (Addendum)
[place], [id number]   COLONOSCOPY PROCEDURE REPORT  PATIENT: [name], [name]  MR#: [id number] BIRTHDATE: 12-21-1960 , 54  yrs. old GENDER: female ENDOSCOPIST: Danie Binder, MD REFERRED [id number] Cindie Laroche, M.D. PROCEDURE DATE:  01/01/2015 PROCEDURE:   Colonoscopy with snare polypectomy INDICATIONS:average risk patient for colon cancer. MEDICATIONS: Demerol 75 mg IV and Versed 4 mg IV  DESCRIPTION OF PROCEDURE:    Physical exam was performed.  Informed consent was obtained from the patient after explaining the benefits, risks, and alternatives to procedure.  The patient was connected to monitor and placed in left lateral position. Continuous oxygen was provided by nasal cannula and IV medicine administered through an indwelling cannula.  After administration of sedation and rectal exam, the patients rectum was intubated and the [id number]  colonoscope was advanced under direct visualization to the cecum.  The scope was removed slowly by carefully examining the color, texture, anatomy, and integrity mucosa on the way out.  The patient was recovered in endoscopy and discharged home in satisfactory condition. Estimated blood loss is zero unless otherwise noted in this procedure report.     COLON FINDINGS: A semi-pedunculated polyp measuring 7 mm in size was found in the sigmoid colon.  A polypectomy was performed using snare cautery and Moderate sized internal hemorrhoids were found.   PREP QUALITY:       CECAL W/D TIME: 12       minutes COMPLICATIONS: None  ENDOSCOPIC IMPRESSION: 1.   ONE Sigmoid polyp REMOVED 2.   Internal hemorrhoids  RECOMMENDATIONS: DRINK WATER TO KEEP YOUR URINE LIGHT YELLOW. FOLLOW A HIGH FIBER DIET.  AVOID ITEMS THAT CAUSE BLOATING & GAS. USE PREPARATION H TIMES A DAY FOR 7 DAYS TO RELIEVE RECTAL PRESSURE/ITCHING/BLEEDING. AWAIT  BIOPSY RESULTS. Next colonoscopy in 5-10  years.      _______________________________ Lorrin MaisDanie Binder, MD 01/09/15 6:40 PM Revised: January 09, 2015 6:40 PM   CPT CODES: ICD CODES:  The ICD and CPT codes recommended by this software are interpretations from the data that the clinical staff has captured with the software.  The verification of the translation of this report to the ICD and CPT codes and modifiers is the sole responsibility of the health care institution and practicing physician where this report was generated.  [employer] will not be held responsible for the validity of the ICD and CPT codes included on this report.  AMA assumes no liability for data contained or not contained herein. CPT is a Designer, television/film set of the [employer].

## 2015-01-07 ENCOUNTER — Telehealth: Payer: Self-pay | Admitting: Gastroenterology

## 2015-01-07 ENCOUNTER — Encounter (HOSPITAL_COMMUNITY): Payer: Self-pay | Admitting: Gastroenterology

## 2015-01-07 NOTE — Telephone Encounter (Signed)
Pt is aware of results. 

## 2015-01-07 NOTE — Telephone Encounter (Signed)
Reminder in epic °

## 2015-01-07 NOTE — Telephone Encounter (Signed)
Please call pt. She had a simple adenoma removed from her colon.    DRINK WATER TO KEEP YOUR URINE LIGHT YELLOW.  FOLLOW A HIGH FIBER DIET. AVOID ITEMS THAT CAUSE BLOATING & GAS.   USE PREPARATION H TIMES A DAY FOR 7 DAYS TO RELIEVE RECTAL PRESSURE/ITCHING/BLEEDING.  Next colonoscopy in 5-10 years.

## 2015-03-18 ENCOUNTER — Ambulatory Visit (INDEPENDENT_AMBULATORY_CARE_PROVIDER_SITE_OTHER): Payer: Medicaid Other | Admitting: "Endocrinology

## 2015-03-18 ENCOUNTER — Encounter: Payer: Self-pay | Admitting: "Endocrinology

## 2015-03-18 VITALS — BP 118/78 | HR 75 | Ht 64.5 in | Wt 161.0 lb

## 2015-03-18 DIAGNOSIS — E785 Hyperlipidemia, unspecified: Secondary | ICD-10-CM

## 2015-03-18 DIAGNOSIS — E890 Postprocedural hypothyroidism: Secondary | ICD-10-CM | POA: Diagnosis not present

## 2015-03-18 MED ORDER — LEVOTHYROXINE SODIUM 88 MCG PO TABS: 88.0000 ug | ORAL_TABLET | Freq: Every day | ORAL | Status: DC

## 2015-03-18 NOTE — Progress Notes (Signed)
Subjective:    Patient ID: [name], female    DOB: 1960-08-11, PCP DONDIEGO,RICHARD [name], MD   Past Medical History  Diagnosis Date  . Arthritis    Past Surgical History  Procedure Laterality Date  . Appendectomy    . Thyroidectomy N/A 08/15/2013    Procedure: TOTAL THYROIDECTOMY;  Surgeon: Jamesetta So, MD;  Location: [facility];  Service: General;  Laterality: N/A;  . Bilateral bunionectomy    . Colonoscopy N/A 01/01/2015    Procedure: COLONOSCOPY;  Surgeon: Danie Binder, MD;  Location: [facility];  Service: Endoscopy;  Laterality: N/A;  11:15 AM - pt knows to arrive at 11:15   Social History   Social History  . Marital Status: Single    Spouse Name: N/A  . Number of Children: N/A  . Years of Education: N/A   Social History Main Topics  . Smoking status: Former Smoker -- 0.25 packs/day for 10 years    Types: Cigarettes    Quit date: 08/11/2012  . Smokeless tobacco: None  . Alcohol Use: No  . Drug Use: No  . Sexual Activity: Yes    Birth Control/ Protection: None   Other Topics Concern  . None   Social History Narrative   Outpatient Encounter Prescriptions as of 03/18/2015  Medication Sig  . pravastatin (PRAVACHOL) 20 MG tablet Take 20 mg by mouth daily.  . calcium-vitamin D (OSCAL) 250-125 MG-UNIT per tablet Take 1 tablet by mouth 2 (two) times daily.  . diclofenac (VOLTAREN) 75 MG EC tablet Take 75 mg by mouth daily.  Marland Kitchen levothyroxine (SYNTHROID, LEVOTHROID) 88 MCG tablet Take 1 tablet (88 mcg total) by mouth daily before breakfast.  . Multiple Vitamins-Minerals (MULTIVITAMINS THER. W/MINERALS) TABS tablet Take 1 tablet by mouth daily.  . [DISCONTINUED] levothyroxine (SYNTHROID, LEVOTHROID) 100 MCG tablet Take 1 tablet (100 mcg total) by mouth daily before breakfast.  . [DISCONTINUED] levothyroxine (SYNTHROID, LEVOTHROID) 88 MCG tablet Take 1 tablet (88 mcg total) by mouth daily before breakfast.   No facility-administered encounter medications on file as  of 03/18/2015.   ALLERGIES: No Known Allergies VACCINATION STATUS:  There is no immunization history on file for this patient.  HPI 55 -yr- old female with medical hx significant for MNG s/p RAI and completion thyroidectomy on 08/15/13 with benign outcomes. 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 she feels better. she has steady weight.  She has family hx of thyroid nodules , but no family hx of thyroid cancer.   Review of Systems Constitutional: no weight gain/loss, no fatigue, no subjective hyperthermia/hypothermia Eyes: no blurry vision, no xerophthalmia ENT: no sore throat, no nodules palpated in throat, no dysphagia/odynophagia, no hoarseness Cardiovascular: no CP/SOB/palpitations/leg swelling Respiratory: no cough/SOB Gastrointestinal: no N/V/D/C Musculoskeletal:  Some mild muscle/joint aches Skin: no rashes Neurological: no tremors/numbness/tingling/dizziness Psychiatric: no depression/anxiety  Objective:    BP 118/78 mmHg  Pulse 75  Ht 5' 4.5" (1.638 m)  Wt 161 lb (73.029 kg)  BMI 27.22 kg/m2  SpO2 99%  LMP 01/11/2013  Wt Readings from Last 3 Encounters:  03/18/15 161 lb (73.029 kg)  08/15/13 161 lb (73.029 kg)  08/11/13 149 lb (67.586 kg)    Physical Exam Constitutional: overweight, in NAD Eyes: PERRLA, EOMI, no exophthalmos ENT: moist mucous membranes, Post thyroidectomy scar on anterior lower neck , no cervical lymphadenopathy Cardiovascular: RRR, No MRG Respiratory: CTA B Gastrointestinal: abdomen soft, NT, ND, BS+ Musculoskeletal: no deformities, strength intact in all 4  Skin: moist, warm, no rashes Neurological: no tremor with outstretched hands, DTR normal in all 4  CMP ( most recent) CMP     Component Value Date/Time   NA 141 08/16/2013 0547   K 4.0 08/16/2013 0547   CL 106 08/16/2013 0547   CO2 25 08/16/2013 0547   GLUCOSE 94 08/16/2013 0547   BUN 11 08/16/2013 0547   CREATININE 0.59 08/16/2013 0547   CALCIUM 8.3*  08/16/2013 0547   PROT 5.9* 08/16/2013 0547   ALBUMIN 3.2* 08/16/2013 0547   AST 17 08/16/2013 0547   ALT 22 08/16/2013 0547   ALKPHOS 47 08/16/2013 0547   BILITOT 0.3 08/16/2013 0547   GFRNONAA >90 08/16/2013 0547   GFRAA >90 08/16/2013 0547    Lipid panel from 03/04/2015 showed total cholesterol 196, triglycerides 311, HDL 36, LDL 98 Total T4 12 0.7, TSH 0.66  Assessment & Plan:   1. Postsurgical hypothyroidism 2.Hyperlipidemia She is s/p completion thyroidectomy . TFTs are c/w Over replacement with thyroid hormone. I will lower the dose of her levothyroxine to 88 g by mouth every morning.   - We discussed about correct intake of levothyroxine, at fasting, with water, separated by at least 30 minutes from breakfast, and separated by more than 4 hours from calcium, iron, multivitamins, acid reflux medications (PPIs). -Patient is made aware of the fact that thyroid hormone replacement is needed for life, dose to be adjusted by periodic monitoring of thyroid function tests.   Regarding her hyperlipidemia, LDL was reasonable at 98. She reports some mild muscle aches. I advised her to cut her pravastatin to 20 mg by mouth daily at bedtime.  - I advised patient to maintain close follow up with Maricela Curet, MD for primary care needs. Follow up plan: Return in about 6 months (around 09/18/2015) for underactive thyroid, follow up with pre-visit labs.  Glade Lloyd, MD Phone: [phone]  Fax: [phone]   03/18/2015, 1:52 PM

## 2015-03-22 ENCOUNTER — Telehealth: Payer: Self-pay | Admitting: "Endocrinology

## 2015-03-22 MED ORDER — PRAVASTATIN SODIUM 40 MG PO TABS: 40.0000 mg | ORAL_TABLET | Freq: Every day | ORAL | Status: DC

## 2015-03-22 NOTE — Telephone Encounter (Signed)
Dr. Dorris Fetch said he would call in Pravastatin for 10mg  to [facility] but it hadn't been called in.

## 2015-03-26 ENCOUNTER — Other Ambulatory Visit: Payer: Self-pay | Admitting: "Endocrinology

## 2015-03-26 ENCOUNTER — Encounter: Payer: Self-pay | Admitting: "Endocrinology

## 2015-03-31 ENCOUNTER — Other Ambulatory Visit: Payer: Self-pay

## 2015-03-31 MED ORDER — LEVOTHYROXINE SODIUM 88 MCG PO TABS: 88.0000 ug | ORAL_TABLET | Freq: Every day | ORAL | Status: DC

## 2015-03-31 MED ORDER — PRAVASTATIN SODIUM 20 MG PO TABS: ORAL_TABLET | ORAL | Status: DC

## 2015-08-05 ENCOUNTER — Other Ambulatory Visit (HOSPITAL_COMMUNITY): Payer: Self-pay | Admitting: Family Medicine

## 2015-08-05 DIAGNOSIS — Z1231 Encounter for screening mammogram for malignant neoplasm of breast: Secondary | ICD-10-CM

## 2015-08-30 ENCOUNTER — Ambulatory Visit (HOSPITAL_COMMUNITY)
Admission: RE | Admit: 2015-08-30 | Discharge: 2015-08-30 | Disposition: A | Discharge status: Home / Self Care | Payer: Medicaid Other | Source: Ambulatory Visit | Attending: Family Medicine | Admitting: Family Medicine

## 2015-08-30 DIAGNOSIS — Z1231 Encounter for screening mammogram for malignant neoplasm of breast: Secondary | ICD-10-CM | POA: Insufficient documentation

## 2015-09-08 ENCOUNTER — Other Ambulatory Visit: Payer: Self-pay | Admitting: "Endocrinology

## 2015-09-09 LAB — TSH: TSH: 1.38 m[IU]/L

## 2015-09-09 LAB — T4, FREE: FREE T4: 1.2 ng/dL (ref 0.8–1.8)

## 2015-09-23 ENCOUNTER — Ambulatory Visit (INDEPENDENT_AMBULATORY_CARE_PROVIDER_SITE_OTHER): Payer: Medicaid Other | Admitting: "Endocrinology

## 2015-09-23 ENCOUNTER — Encounter: Payer: Self-pay | Admitting: "Endocrinology

## 2015-09-23 VITALS — BP 103/71 | HR 79 | Ht 64.5 in | Wt 161.0 lb

## 2015-09-23 DIAGNOSIS — E890 Postprocedural hypothyroidism: Secondary | ICD-10-CM

## 2015-09-23 DIAGNOSIS — E785 Hyperlipidemia, unspecified: Secondary | ICD-10-CM | POA: Diagnosis not present

## 2015-09-23 MED ORDER — LEVOTHYROXINE SODIUM 88 MCG PO TABS: 88.0000 ug | ORAL_TABLET | Freq: Every day | ORAL | 6 refills | Status: DC

## 2015-09-23 NOTE — Progress Notes (Signed)
Subjective:    Patient ID: [name], female    DOB: 04-09-1960, PCP DONDIEGO,RICHARD [name], MD   Past Medical History:  Diagnosis Date  . Arthritis    Past Surgical History:  Procedure Laterality Date  . APPENDECTOMY    . Bilateral bunionectomy    . COLONOSCOPY N/A 01/01/2015   Procedure: COLONOSCOPY;  Surgeon: Danie Binder, MD;  Location: [facility];  Service: Endoscopy;  Laterality: N/A;  11:15 AM - pt knows to arrive at 11:15  . THYROIDECTOMY N/A 08/15/2013   Procedure: TOTAL THYROIDECTOMY;  Surgeon: Jamesetta So, MD;  Location: [facility];  Service: General;  Laterality: N/A;   Social History   Social History  . Marital status: Single    Spouse name: N/A  . Number of children: N/A  . Years of education: N/A   Social History Main Topics  . Smoking status: Former Smoker    Packs/day: 0.25    Years: 10.00    Types: Cigarettes    Quit date: 08/11/2012  . Smokeless tobacco: Never Used  . Alcohol use No  . Drug use: No  . Sexual activity: Yes    Birth control/ protection: None   Other Topics Concern  . None   Social History Narrative  . None   Outpatient Encounter Prescriptions as of 09/23/2015  Medication Sig  . calcium-vitamin D (OSCAL) 250-125 MG-UNIT per tablet Take 1 tablet by mouth 2 (two) times daily.  . diclofenac (VOLTAREN) 75 MG EC tablet Take 75 mg by mouth daily.  Marland Kitchen levothyroxine (SYNTHROID, LEVOTHROID) 88 MCG tablet Take 1 tablet (88 mcg total) by mouth daily before breakfast.  . Multiple Vitamins-Minerals (MULTIVITAMINS THER. W/MINERALS) TABS tablet Take 1 tablet by mouth daily.  . pravastatin (PRAVACHOL) 20 MG tablet 20 mg once daily  . [DISCONTINUED] levothyroxine (SYNTHROID, LEVOTHROID) 88 MCG tablet Take 1 tablet (88 mcg total) by mouth daily before breakfast.   No facility-administered encounter medications on file as of 09/23/2015.    ALLERGIES: No Known Allergies VACCINATION STATUS:  There is no immunization history on file for this  patient.  HPI 55-yr- old female with medical hx significant for MNG s/p RAI and completion thyroidectomy on 08/15/13 with benign outcomes. She is on Lt4 88 mcg po qam for post surgical hypothyroidism. she feels better. she has steady weight.  She has family hx of thyroid nodules , but no family hx of thyroid cancer.   Review of Systems Constitutional: She has a steady weight, no fatigue, no subjective hyperthermia/hypothermia Eyes: no blurry vision, no xerophthalmia ENT: no sore throat, no nodules palpated in throat, no dysphagia/odynophagia, no hoarseness Cardiovascular: no CP/SOB/palpitations/leg swelling Respiratory: no cough/SOB Gastrointestinal: no N/V/D/C Musculoskeletal:  Some mild muscle/joint aches Skin: no rashes Neurological: no tremors/numbness/tingling/dizziness Psychiatric: no depression/anxiety  Objective:    BP 103/71   Pulse 79   Ht 5' 4.5" (1.638 m)   Wt 161 lb (73 kg)   LMP 01/11/2013 Comment: serum pregnancy negative on 08/11/2013  BMI 27.21 kg/m   Wt Readings from Last 3 Encounters:  09/23/15 161 lb (73 kg)  03/18/15 161 lb (73 kg)  08/15/13 161 lb (73 kg)    Physical Exam Constitutional: overweight, in NAD Eyes: PERRLA, EOMI, no exophthalmos ENT: moist mucous membranes, Post thyroidectomy scar on anterior lower neck , no cervical lymphadenopathy Cardiovascular: RRR, No MRG Respiratory: CTA B Gastrointestinal: abdomen soft, NT, ND, BS+ Musculoskeletal: no deformities, strength intact in all 4 Skin: moist, warm, no rashes Neurological:  no tremor with outstretched hands, DTR normal in all 4  CMP ( most recent) CMP     Component Value Date/Time   NA 141 08/16/2013 0547   K 4.0 08/16/2013 0547   CL 106 08/16/2013 0547   CO2 25 08/16/2013 0547   GLUCOSE 94 08/16/2013 0547   BUN 11 08/16/2013 0547   CREATININE 0.59 08/16/2013 0547   CALCIUM 8.3 (L) 08/16/2013 0547   PROT 5.9 (L) 08/16/2013 0547   ALBUMIN 3.2 (L) 08/16/2013 0547   AST 17  08/16/2013 0547   ALT 22 08/16/2013 0547   ALKPHOS 47 08/16/2013 0547   BILITOT 0.3 08/16/2013 0547   GFRNONAA >90 08/16/2013 0547   GFRAA >90 08/16/2013 0547   Recent Results (from the past 2160 hour(s))  TSH     Status: None   Collection Time: 09/08/15  2:48 PM  Result Value Ref Range   TSH 1.38 mIU/L    Comment:   Reference Range   > or = 20 Years  0.40-4.50   Pregnancy Range First trimester  0.26-2.66 Second trimester 0.55-2.73 Third trimester  0.43-2.91     T4, free     Status: None   Collection Time: 09/08/15  2:48 PM  Result Value Ref Range   Free T4 1.2 0.8 - 1.8 ng/dL   Lipid panel from 03/04/2015 showed total cholesterol 196, triglycerides 311, HDL 36, LDL 98 Total T4 12 0.7, TSH 0.66  Assessment & Plan:   1. Postsurgical hypothyroidism 2.Hyperlipidemia She is s/p completion thyroidectomy . TFTs are c/w  appropriate replacement with thyroid hormone. I will continue her levothyroxine 88 g by mouth every morning.   - We discussed about correct intake of levothyroxine, at fasting, with water, separated by at least 30 minutes from breakfast, and separated by more than 4 hours from calcium, iron, multivitamins, acid reflux medications (PPIs). -Patient is made aware of the fact that thyroid hormone replacement is needed for life, dose to be adjusted by periodic monitoring of thyroid function tests.   Regarding her hyperlipidemia, LDL was reasonable at 98. She reports some mild muscle aches. I advised her to cut her pravastatin to 20 mg by mouth daily at bedtime.  - I advised patient to maintain close follow up with Maricela Curet, MD for primary care needs. Follow up plan: Return in about 6 months (around 03/22/2016) for follow up with pre-visit labs.  Glade Lloyd, MD Phone: [phone]  Fax: [phone]   09/23/2015, 9:32 AM

## 2016-03-01 ENCOUNTER — Other Ambulatory Visit: Payer: Self-pay | Admitting: "Endocrinology

## 2016-03-01 LAB — LIPID PANEL
Cholesterol: 223 — AB (ref 0–200)
HDL: 37 (ref 35–70)
LDL CALC: 147

## 2016-03-01 LAB — T4, FREE: FREE T4: 1.4 ng/dL (ref 0.8–1.8)

## 2016-03-28 ENCOUNTER — Ambulatory Visit (INDEPENDENT_AMBULATORY_CARE_PROVIDER_SITE_OTHER): Payer: Medicaid Other | Admitting: "Endocrinology

## 2016-03-28 ENCOUNTER — Encounter: Payer: Self-pay | Admitting: "Endocrinology

## 2016-03-28 VITALS — BP 112/74 | HR 80 | Ht 64.5 in | Wt 160.0 lb

## 2016-03-28 DIAGNOSIS — E890 Postprocedural hypothyroidism: Secondary | ICD-10-CM | POA: Diagnosis not present

## 2016-03-28 MED ORDER — LEVOTHYROXINE SODIUM 88 MCG PO TABS: 88.0000 ug | ORAL_TABLET | Freq: Every day | ORAL | 4 refills | Status: DC

## 2016-03-28 NOTE — Progress Notes (Signed)
Subjective:    Patient ID: [name], female    DOB: 1960-01-30, PCP Whitney [name], MD   Past Medical History:  Diagnosis Date  . Arthritis    Past Surgical History:  Procedure Laterality Date  . APPENDECTOMY    . Bilateral bunionectomy    . COLONOSCOPY N/A 01/01/2015   Procedure: COLONOSCOPY;  Surgeon: Danie Binder, MD;  Location: [facility];  Service: Endoscopy;  Laterality: N/A;  11:15 AM - pt knows to arrive at 11:15  . THYROIDECTOMY N/A 08/15/2013   Procedure: TOTAL THYROIDECTOMY;  Surgeon: Jamesetta So, MD;  Location: [facility];  Service: General;  Laterality: N/A;   Social History   Social History  . Marital status: Single    Spouse name: N/A  . Number of children: N/A  . Years of education: N/A   Social History Main Topics  . Smoking status: Former Smoker    Packs/day: 0.25    Years: 10.00    Types: Cigarettes    Quit date: 08/11/2012  . Smokeless tobacco: Never Used  . Alcohol use No  . Drug use: No  . Sexual activity: Yes    Birth control/ protection: None   Other Topics Concern  . None   Social History Narrative  . None   Outpatient Encounter Prescriptions as of 03/28/2016  Medication Sig  . rosuvastatin (CRESTOR) 20 MG tablet Take 20 mg by mouth daily.  . calcium-vitamin D (OSCAL) 250-125 MG-UNIT per tablet Take 1 tablet by mouth 2 (two) times daily.  . diclofenac (VOLTAREN) 75 MG EC tablet Take 75 mg by mouth daily.  Marland Kitchen levothyroxine (SYNTHROID, LEVOTHROID) 88 MCG tablet Take 1 tablet (88 mcg total) by mouth daily before breakfast.  . Multiple Vitamins-Minerals (MULTIVITAMINS THER. W/MINERALS) TABS tablet Take 1 tablet by mouth daily.  . [DISCONTINUED] levothyroxine (SYNTHROID, LEVOTHROID) 88 MCG tablet Take 1 tablet (88 mcg total) by mouth daily before breakfast.  . [DISCONTINUED] pravastatin (PRAVACHOL) 20 MG tablet 20 mg once daily   No facility-administered encounter medications on file as of 03/28/2016.    ALLERGIES: No Known  Allergies VACCINATION STATUS:  There is no immunization history on file for this patient.  HPI   56-yr- old female with medical hx significant for MNG s/p RAI and completion thyroidectomy on 08/15/13 with benign outcomes. She is on levothyroxine 88 mcg po qam for post surgical hypothyroidism. she feels better. she has steady weight.  She has family hx of thyroid nodules , but no family hx of thyroid cancer.   Review of Systems Constitutional: She has a steady weight, no fatigue, no subjective hyperthermia/hypothermia Eyes: no blurry vision, no xerophthalmia ENT: no sore throat, no nodules palpated in throat, no dysphagia/odynophagia, no hoarseness Cardiovascular: no CP/SOB/palpitations/leg swelling Respiratory: no cough/SOB Gastrointestinal: no N/V/D/C Musculoskeletal:  Some mild muscle/joint aches Skin: no rashes Neurological: no tremors/numbness/tingling/dizziness Psychiatric: no depression/anxiety  Objective:    BP 112/74   Pulse 80   Ht 5' 4.5" (1.638 m)   Wt 160 lb (72.6 kg)   LMP 06/24/2013   BMI 27.04 kg/m   Wt Readings from Last 3 Encounters:  03/28/16 160 lb (72.6 kg)  09/23/15 161 lb (73 kg)  03/18/15 161 lb (73 kg)    Physical Exam Constitutional: overweight, in NAD Eyes: PERRLA, EOMI, no exophthalmos ENT: moist mucous membranes, Post thyroidectomy scar on anterior lower neck , no cervical lymphadenopathy Cardiovascular: RRR, No MRG Respiratory: CTA B Gastrointestinal: abdomen soft, NT, ND, BS+ Musculoskeletal: no deformities,  strength intact in all 4 Skin: moist, warm, no rashes Neurological: no tremor with outstretched hands, DTR normal in all 4  CMP ( most recent) CMP     Component Value Date/Time   NA 141 08/16/2013 0547   K 4.0 08/16/2013 0547   CL 106 08/16/2013 0547   CO2 25 08/16/2013 0547   GLUCOSE 94 08/16/2013 0547   BUN 11 08/16/2013 0547   CREATININE 0.59 08/16/2013 0547   CALCIUM 8.3 (L) 08/16/2013 0547   PROT 5.9 (L) 08/16/2013  0547   ALBUMIN 3.2 (L) 08/16/2013 0547   AST 17 08/16/2013 0547   ALT 22 08/16/2013 0547   ALKPHOS 47 08/16/2013 0547   BILITOT 0.3 08/16/2013 0547   GFRNONAA >90 08/16/2013 0547   GFRAA >90 08/16/2013 0547   Recent Results (from the past 2160 hour(s))  T4, free     Status: None   Collection Time: 03/01/16 10:04 AM  Result Value Ref Range   Free T4 1.4 0.8 - 1.8 ng/dL     Assessment & Plan:   1. Postsurgical hypothyroidism  She is s/p completion thyroidectomy . TFTs are c/w  appropriate replacement with thyroid hormone. I will continue her levothyroxine 88 g by mouth every morning.   - We discussed about correct intake of levothyroxine, at fasting, with water, separated by at least 30 minutes from breakfast, and separated by more than 4 hours from calcium, iron, multivitamins, acid reflux medications (PPIs). -Patient is made aware of the fact that thyroid hormone replacement is needed for life, dose to be adjusted by periodic monitoring of thyroid function tests.   Regarding her hyperlipidemia, LDL Is high or not 147, she is switched to Crestor 20 mg by mouth daily at bedtime by her M.D. I advised her to continue follow-up. - I advised patient to maintain close follow up with Maricela Curet, MD for primary care needs. Follow up plan: Return in about 1 year (around 03/28/2017) for follow up with pre-visit labs.  Glade Lloyd, MD Phone: [phone]  Fax: [phone]   03/28/2016, 9:25 AM

## 2016-09-06 ENCOUNTER — Ambulatory Visit (HOSPITAL_COMMUNITY)
Admission: RE | Admit: 2016-09-06 | Discharge: 2016-09-06 | Disposition: A | Discharge status: Home / Self Care | Payer: BLUE CROSS/BLUE SHIELD | Source: Ambulatory Visit | Attending: Family Medicine | Admitting: Family Medicine

## 2016-09-06 ENCOUNTER — Other Ambulatory Visit (HOSPITAL_COMMUNITY): Payer: Self-pay | Admitting: Family Medicine

## 2016-09-06 DIAGNOSIS — Z1231 Encounter for screening mammogram for malignant neoplasm of breast: Secondary | ICD-10-CM

## 2017-03-01 LAB — LIPID PANEL: TRIGLYCERIDES: 243 — AB (ref 40–160)

## 2017-03-22 ENCOUNTER — Encounter: Payer: Self-pay | Admitting: "Endocrinology

## 2017-03-22 LAB — LIPID PANEL
Cholesterol: 160 (ref 0–200)
HDL: 46 (ref 35–70)
LDL Cholesterol: 80
Triglycerides: 246 — AB (ref 40–160)

## 2017-03-22 LAB — VITAMIN D 25 HYDROXY (VIT D DEFICIENCY, FRACTURES): Vit D, 25-Hydroxy: 74

## 2017-03-22 LAB — BASIC METABOLIC PANEL
BUN: 15 (ref 4–21)
CREATININE: 0.7 (ref <=1.1)

## 2017-03-22 LAB — TSH: TSH: 0.93 (ref <=5.90)

## 2017-03-28 ENCOUNTER — Encounter: Payer: Self-pay | Admitting: "Endocrinology

## 2017-03-28 ENCOUNTER — Ambulatory Visit: Payer: BLUE CROSS/BLUE SHIELD | Admitting: "Endocrinology

## 2017-03-28 VITALS — BP 111/76 | HR 80 | Ht 64.5 in | Wt 160.0 lb

## 2017-03-28 DIAGNOSIS — E890 Postprocedural hypothyroidism: Secondary | ICD-10-CM | POA: Diagnosis not present

## 2017-03-28 MED ORDER — LEVOTHYROXINE SODIUM 88 MCG PO TABS: 88.0000 ug | ORAL_TABLET | Freq: Every day | ORAL | 4 refills | Status: DC

## 2017-03-28 NOTE — Progress Notes (Signed)
Subjective:    Patient ID: [name], female    DOB: 05-19-1960, PCP Lucia Gaskins, MD   Past Medical History:  Diagnosis Date  . Arthritis    Past Surgical History:  Procedure Laterality Date  . APPENDECTOMY    . Bilateral bunionectomy    . COLONOSCOPY N/A 01/01/2015   Procedure: COLONOSCOPY;  Surgeon: Danie Binder, MD;  Location: [facility];  Service: Endoscopy;  Laterality: N/A;  11:15 AM - pt knows to arrive at 11:15  . THYROIDECTOMY N/A 08/15/2013   Procedure: TOTAL THYROIDECTOMY;  Surgeon: Jamesetta So, MD;  Location: [facility];  Service: General;  Laterality: N/A;   Social History   Socioeconomic History  . Marital status: Married    Spouse name: Not on file  . Number of children: Not on file  . Years of education: Not on file  . Highest education level: Not on file  Occupational History  . Not on file  Social Needs  . Financial resource strain: Not on file  . Food insecurity:    Worry: Not on file    Inability: Not on file  . Transportation needs:    Medical: Not on file    Non-medical: Not on file  Tobacco Use  . Smoking status: Former Smoker    Packs/day: 0.25    Years: 10.00    Pack years: 2.50    Types: Cigarettes    Last attempt to quit: 08/11/2012    Years since quitting: 4.6  . Smokeless tobacco: Never Used  Substance and Sexual Activity  . Alcohol use: No  . Drug use: No  . Sexual activity: Yes    Birth control/protection: None  Lifestyle  . Physical activity:    Days per week: Not on file    Minutes per session: Not on file  . Stress: Not on file  Relationships  . Social connections:    Talks on phone: Not on file    Gets together: Not on file    Attends religious service: Not on file    Active member of club or organization: Not on file    Attends meetings of clubs or organizations: Not on file    Relationship status: Not on file  Other Topics Concern  . Not on file  Social History Narrative  . Not on file    Outpatient Encounter Medications as of 03/28/2017  Medication Sig  . calcium-vitamin D (OSCAL) 250-125 MG-UNIT per tablet Take 1 tablet by mouth 2 (two) times daily.  . diclofenac (VOLTAREN) 75 MG EC tablet Take 75 mg by mouth daily.  Marland Kitchen levothyroxine (SYNTHROID, LEVOTHROID) 88 MCG tablet Take 1 tablet (88 mcg total) by mouth daily before breakfast.  . Multiple Vitamins-Minerals (MULTIVITAMINS THER. W/MINERALS) TABS tablet Take 1 tablet by mouth daily.  . rosuvastatin (CRESTOR) 20 MG tablet Take 20 mg by mouth daily.  . [DISCONTINUED] levothyroxine (SYNTHROID, LEVOTHROID) 88 MCG tablet Take 1 tablet (88 mcg total) by mouth daily before breakfast.   No facility-administered encounter medications on file as of 03/28/2017.    ALLERGIES: No Known Allergies VACCINATION STATUS:  There is no immunization history on file for this patient.  HPI   57-yr- old female with medical history significant for multinodular goiter status post prior radioactive iodine ablation, and completion thyroidectomy on 08/15/13 with benign outcomes. She is on levothyroxine 88 mcg po qam for post surgical hypothyroidism. she feels better. she has steady weight.  She denies cold/hot intolerance. -She has  a steady energy level.  She has family hx of thyroid nodules , but no family hx of thyroid cancer.   Review of Systems Constitutional: She has a steady weight, no fatigue, no subjective hyperthermia/hypothermia Eyes: no blurry vision, no xerophthalmia ENT: no sore throat, no nodules palpated in throat, no dysphagia/odynophagia, no hoarseness Cardiovascular: no chest pain, palpitations.  Respiratory: no cough, shortness of breath.  Gastrointestinal: no N/V/D/C Musculoskeletal:  Some mild muscle/joint aches Skin: no rashes Neurological: No tremors, no numbness.   Psychiatric: no depression/anxiety  Objective:    BP 111/76   Pulse 80   Ht 5' 4.5" (1.638 m)   Wt 160 lb (72.6 kg)   LMP 06/24/2013   BMI  27.04 kg/m   Wt Readings from Last 3 Encounters:  03/28/17 160 lb (72.6 kg)  03/28/16 160 lb (72.6 kg)  09/23/15 161 lb (73 kg)    Physical Exam Constitutional: + Overweight, not in acute distress, alert and oriented x3.  Eyes: PERRLA, EOMI, no exophthalmos ENT: moist mucous membranes, post thyroidectomy scar on anterior lower neck,  no cervical lymphadenopathy  Musculoskeletal: no deformities, strength intact in all 4 Skin: moist, warm, no rashes Neurological: no tremor with outstretched hands,   CMP     Component Value Date/Time   NA 141 08/16/2013 0547   K 4.0 08/16/2013 0547   CL 106 08/16/2013 0547   CO2 25 08/16/2013 0547   GLUCOSE 94 08/16/2013 0547   BUN 15 03/22/2017   CREATININE 0.7 03/22/2017   CREATININE 0.59 08/16/2013 0547   CALCIUM 8.3 (L) 08/16/2013 0547   PROT 5.9 (L) 08/16/2013 0547   ALBUMIN 3.2 (L) 08/16/2013 0547   AST 17 08/16/2013 0547   ALT 22 08/16/2013 0547   ALKPHOS 47 08/16/2013 0547   BILITOT 0.3 08/16/2013 0547   GFRNONAA >90 08/16/2013 0547   GFRAA >90 08/16/2013 0547   Recent Results (from the past 2160 hour(s))  Lipid panel     Status: Abnormal   Collection Time: 03/01/17 12:00 AM  Result Value Ref Range   Triglycerides 243 (A) 40 - 160  VITAMIN D 25 Hydroxy (Vit-D Deficiency, Fractures)     Status: None   Collection Time: 03/22/17 12:00 AM  Result Value Ref Range   Vit D, 25-Hydroxy 74   Basic metabolic panel     Status: None   Collection Time: 03/22/17 12:00 AM  Result Value Ref Range   BUN 15 4 - 21   Creatinine 0.7 0.5 - 1.1  Lipid panel     Status: Abnormal   Collection Time: 03/22/17 12:00 AM  Result Value Ref Range   Triglycerides 246 (A) 40 - 160   Cholesterol 160 0 - 200   HDL 46 35 - 70   LDL Cholesterol 80   TSH     Status: None   Collection Time: 03/22/17 12:00 AM  Result Value Ref Range   TSH 0.93 0.41 - 5.90     Assessment & Plan:   1. Postsurgical hypothyroidism  She is s/p completion thyroidectomy  .   Her thyroid function tests are consistent with appropriate replacement.  I advised her to continue levothyroxine 88 mcg p.o. every morning.    - We discussed about correct intake of levothyroxine, at fasting, with water, separated by at least 30 minutes from breakfast, and separated by more than 4 hours from calcium, iron, multivitamins, acid reflux medications (PPIs). -Patient is made aware of the fact that thyroid hormone replacement is needed for life,  dose to be adjusted by periodic monitoring of thyroid function tests.   Regarding her hyperlipidemia, LDL is improved to 80 from 147.  She is advised to continue Crestor 20 mg p.o. nightly.   She is offered a screening bone density before her next visit.   - I advised patient to maintain close follow up with Lucia Gaskins, MD for primary care needs.  Follow up plan: Return in about 1 year (around 03/29/2018) for follow up with pre-visit labs, follow up with Bone Density.  Glade Lloyd, MD Phone: [phone]  Fax: [phone]  -  This note was partially dictated with voice recognition software. Similar sounding words can be transcribed inadequately or may not  be corrected upon review.  03/28/2017, 10:14 AM

## 2017-04-25 ENCOUNTER — Ambulatory Visit (HOSPITAL_COMMUNITY)
Admission: RE | Admit: 2017-04-25 | Discharge: 2017-04-25 | Disposition: A | Discharge status: Home / Self Care | Payer: BLUE CROSS/BLUE SHIELD | Source: Ambulatory Visit | Attending: "Endocrinology | Admitting: "Endocrinology

## 2017-04-25 DIAGNOSIS — Z13820 Encounter for screening for osteoporosis: Secondary | ICD-10-CM | POA: Insufficient documentation

## 2017-04-25 DIAGNOSIS — E890 Postprocedural hypothyroidism: Secondary | ICD-10-CM | POA: Diagnosis not present

## 2017-04-25 DIAGNOSIS — M8588 Other specified disorders of bone density and structure, other site: Secondary | ICD-10-CM | POA: Diagnosis not present

## 2017-08-16 ENCOUNTER — Other Ambulatory Visit (HOSPITAL_COMMUNITY): Payer: Self-pay | Admitting: Family Medicine

## 2017-08-16 DIAGNOSIS — Z1231 Encounter for screening mammogram for malignant neoplasm of breast: Secondary | ICD-10-CM

## 2017-09-12 ENCOUNTER — Ambulatory Visit (HOSPITAL_COMMUNITY)
Admission: RE | Admit: 2017-09-12 | Discharge: 2017-09-12 | Disposition: A | Discharge status: Home / Self Care | Payer: BLUE CROSS/BLUE SHIELD | Source: Ambulatory Visit | Attending: Family Medicine | Admitting: Family Medicine

## 2017-09-12 DIAGNOSIS — Z1231 Encounter for screening mammogram for malignant neoplasm of breast: Secondary | ICD-10-CM | POA: Diagnosis not present

## 2018-03-21 ENCOUNTER — Other Ambulatory Visit: Payer: Self-pay | Admitting: "Endocrinology

## 2018-03-21 DIAGNOSIS — E890 Postprocedural hypothyroidism: Secondary | ICD-10-CM

## 2018-03-21 LAB — TSH: TSH: 0.87 mIU/L

## 2018-03-21 LAB — T4, FREE: FREE T4: 1.5 ng/dL (ref 0.8–1.8)

## 2018-03-29 ENCOUNTER — Encounter: Payer: Self-pay | Admitting: "Endocrinology

## 2018-03-29 ENCOUNTER — Other Ambulatory Visit: Payer: Self-pay | Admitting: "Endocrinology

## 2018-03-29 ENCOUNTER — Ambulatory Visit (INDEPENDENT_AMBULATORY_CARE_PROVIDER_SITE_OTHER): Payer: BLUE CROSS/BLUE SHIELD | Admitting: "Endocrinology

## 2018-03-29 ENCOUNTER — Other Ambulatory Visit: Payer: Self-pay

## 2018-03-29 DIAGNOSIS — E890 Postprocedural hypothyroidism: Secondary | ICD-10-CM | POA: Diagnosis not present

## 2018-03-29 MED ORDER — LEVOTHYROXINE SODIUM 88 MCG PO TABS: 88.0000 ug | ORAL_TABLET | Freq: Every day | ORAL | 3 refills | Status: DC

## 2018-03-29 NOTE — Progress Notes (Signed)
Endocrinology Telephone Visit Follow up Note -During COVID -19 Pandemic   Subjective:    Patient ID: [name], female    DOB: 24-Jan-1960, PCP Lucia Gaskins, MD   Past Medical History:  Diagnosis Date  . Arthritis    Past Surgical History:  Procedure Laterality Date  . APPENDECTOMY    . Bilateral bunionectomy    . COLONOSCOPY N/A 01/01/2015   Procedure: COLONOSCOPY;  Surgeon: Danie Binder, MD;  Location: [facility];  Service: Endoscopy;  Laterality: N/A;  11:15 AM - pt knows to arrive at 11:15  . THYROIDECTOMY N/A 08/15/2013   Procedure: TOTAL THYROIDECTOMY;  Surgeon: Jamesetta So, MD;  Location: [facility];  Service: General;  Laterality: N/A;   Social History   Socioeconomic History  . Marital status: Married    Spouse name: Not on file  . Number of children: Not on file  . Years of education: Not on file  . Highest education level: Not on file  Occupational History  . Not on file  Social Needs  . Financial resource strain: Not on file  . Food insecurity:    Worry: Not on file    Inability: Not on file  . Transportation needs:    Medical: Not on file    Non-medical: Not on file  Tobacco Use  . Smoking status: Former Smoker    Packs/day: 0.25    Years: 10.00    Pack years: 2.50    Types: Cigarettes    Last attempt to quit: 08/11/2012    Years since quitting: 5.6  . Smokeless tobacco: Never Used  Substance and Sexual Activity  . Alcohol use: No  . Drug use: No  . Sexual activity: Yes    Birth control/protection: None  Lifestyle  . Physical activity:    Days per week: Not on file    Minutes per session: Not on file  . Stress: Not on file  Relationships  . Social connections:    Talks on phone: Not on file    Gets together: Not on file    Attends religious service: Not on file    Active member of club or organization: Not on file    Attends meetings of clubs or organizations: Not on file     Relationship status: Not on file  Other Topics Concern  . Not on file  Social History Narrative  . Not on file   Outpatient Encounter Medications as of 03/29/2018  Medication Sig  . calcium-vitamin D (OSCAL) 250-125 MG-UNIT per tablet Take 1 tablet by mouth 2 (two) times daily.  Marland Kitchen levothyroxine (SYNTHROID, LEVOTHROID) 88 MCG tablet Take 1 tablet (88 mcg total) by mouth daily before breakfast.  . Multiple Vitamins-Minerals (MULTIVITAMINS THER. W/MINERALS) TABS tablet Take 1 tablet by mouth daily.  . rosuvastatin (CRESTOR) 20 MG tablet Take 20 mg by mouth daily.  . [DISCONTINUED] diclofenac (VOLTAREN) 75 MG EC tablet Take 75 mg by mouth daily.  . [DISCONTINUED] levothyroxine (SYNTHROID, LEVOTHROID) 88 MCG tablet Take 1 tablet (88 mcg total) by mouth daily before breakfast.   No facility-administered encounter medications on file as of 03/29/2018.    ALLERGIES: No Known Allergies VACCINATION STATUS:  There  is no immunization history on file for this patient.  HPI   58 year old female  with medical history significant for multinodular goiter status post prior radioactive iodine ablation, and completion thyroidectomy on 08/15/13 with benign outcomes. She is on levothyroxine 88 mcg p.o. every morning for postsurgical hypothyroidism.  She continues to feel better, no new complaints.    She denies cold/hot intolerance. -She has a steady energy level.  She has family hx of thyroid nodules , but no family hx of thyroid cancer.    Objective:    LMP 06/24/2013   Wt Readings from Last 3 Encounters:  03/28/17 160 lb (72.6 kg)  03/28/16 160 lb (72.6 kg)  09/23/15 161 lb (73 kg)       CMP     Component Value Date/Time   NA 141 08/16/2013 0547   K 4.0 08/16/2013 0547   CL 106 08/16/2013 0547   CO2 25 08/16/2013 0547   GLUCOSE 94 08/16/2013 0547   BUN 15 03/22/2017   CREATININE 0.7 03/22/2017   CREATININE 0.59 08/16/2013 0547   CALCIUM 8.3 (L) 08/16/2013 0547   PROT 5.9 (L)  08/16/2013 0547   ALBUMIN 3.2 (L) 08/16/2013 0547   AST 17 08/16/2013 0547   ALT 22 08/16/2013 0547   ALKPHOS 47 08/16/2013 0547   BILITOT 0.3 08/16/2013 0547   GFRNONAA >90 08/16/2013 0547   GFRAA >90 08/16/2013 0547   Recent Results (from the past 2160 hour(s))  TSH     Status: None   Collection Time: 03/21/18 11:31 AM  Result Value Ref Range   TSH 0.87 mIU/L    Comment:           Reference Range .           > or = 20 Years  0.40-4.50 .                Pregnancy Ranges           First trimester    0.26-2.66           Second trimester   0.55-2.73           Third trimester    0.43-2.91   T4, Free     Status: None   Collection Time: 03/21/18 11:31 AM  Result Value Ref Range   Free T4 1.5 0.8 - 1.8 ng/dL     Assessment & Plan:   1. Postsurgical hypothyroidism  She is status post completion thyroidectomy.  Her previsit thyroid function tests are consistent with appropriate replacement.  She is advised to continue levothyroxine 88 mcg p.o. every morning.    - We discussed about the correct intake of her thyroid hormone, on empty stomach at fasting, with water, separated by at least 30 minutes from breakfast and other medications,  and separated by more than 4 hours from calcium, iron, multivitamins, acid reflux medications (PPIs). -Patient is made aware of the fact that thyroid hormone replacement is needed for life, dose to be adjusted by periodic monitoring of thyroid function tests.  Regarding her hyperlipidemia. She is advised to continue Crestor 20 mg p.o. nightly.   - I advised patient to maintain close follow up with Lucia Gaskins, MD for primary care needs.  Follow up plan: Return in about 1 year (around 03/29/2019) for Follow up with Pre-visit Labs.  Glade Lloyd, MD Phone: [phone]  Fax: [phone]  -  This note was partially dictated with voice recognition software. Similar sounding words can be transcribed inadequately or may  not  be corrected upon  review.  03/29/2018, 8:42 AM

## 2018-09-12 ENCOUNTER — Other Ambulatory Visit (HOSPITAL_COMMUNITY): Payer: Self-pay | Admitting: Family Medicine

## 2018-09-12 DIAGNOSIS — Z1231 Encounter for screening mammogram for malignant neoplasm of breast: Secondary | ICD-10-CM

## 2018-09-19 ENCOUNTER — Ambulatory Visit (HOSPITAL_COMMUNITY)
Admission: RE | Admit: 2018-09-19 | Discharge: 2018-09-19 | Disposition: A | Discharge status: Home / Self Care | Payer: BC Managed Care – PPO | Source: Ambulatory Visit | Attending: Family Medicine | Admitting: Family Medicine

## 2018-09-19 ENCOUNTER — Other Ambulatory Visit: Payer: Self-pay

## 2018-09-19 DIAGNOSIS — Z1231 Encounter for screening mammogram for malignant neoplasm of breast: Secondary | ICD-10-CM | POA: Insufficient documentation

## 2019-03-19 ENCOUNTER — Other Ambulatory Visit: Payer: Self-pay

## 2019-03-19 DIAGNOSIS — E890 Postprocedural hypothyroidism: Secondary | ICD-10-CM

## 2019-03-19 LAB — LIPID PANEL
Cholesterol: 158 (ref 0–200)
HDL: 44 (ref 35–70)
LDL Cholesterol: 86
Triglycerides: 188 — AB (ref 40–160)

## 2019-03-19 LAB — HEMOGLOBIN A1C: Hemoglobin A1C: 5.8

## 2019-03-19 LAB — BASIC METABOLIC PANEL
BUN: 14 (ref 4–21)
Creatinine: 0.6 (ref 0.5–1.1)

## 2019-03-19 LAB — TSH: TSH: 1.01 (ref 0.41–5.90)

## 2019-03-20 ENCOUNTER — Other Ambulatory Visit: Payer: Self-pay | Admitting: "Endocrinology

## 2019-03-20 LAB — T4, FREE: Free T4: 1.4 ng/dL (ref 0.8–1.8)

## 2019-03-31 ENCOUNTER — Other Ambulatory Visit: Payer: Self-pay

## 2019-03-31 ENCOUNTER — Encounter: Payer: Self-pay | Admitting: "Endocrinology

## 2019-03-31 ENCOUNTER — Ambulatory Visit (INDEPENDENT_AMBULATORY_CARE_PROVIDER_SITE_OTHER): Payer: 59 | Admitting: "Endocrinology

## 2019-03-31 VITALS — BP 100/66 | HR 56 | Ht 64.5 in | Wt 171.4 lb

## 2019-03-31 DIAGNOSIS — E890 Postprocedural hypothyroidism: Secondary | ICD-10-CM

## 2019-03-31 DIAGNOSIS — R7303 Prediabetes: Secondary | ICD-10-CM

## 2019-03-31 MED ORDER — LEVOTHYROXINE SODIUM 88 MCG PO TABS: ORAL_TABLET | ORAL | 1 refills | Status: DC

## 2019-03-31 NOTE — Progress Notes (Signed)
03/31/2019   Endocrinology follow-up note   Subjective:    Patient ID: [name], female    DOB: February 09, 1960, PCP Lucia Gaskins, MD   Past Medical History:  Diagnosis Date  . Arthritis    Past Surgical History:  Procedure Laterality Date  . APPENDECTOMY    . Bilateral bunionectomy    . COLONOSCOPY N/A 01/01/2015   Procedure: COLONOSCOPY;  Surgeon: Danie Binder, MD;  Location: [facility];  Service: Endoscopy;  Laterality: N/A;  11:15 AM - pt knows to arrive at 11:15  . THYROIDECTOMY N/A 08/15/2013   Procedure: TOTAL THYROIDECTOMY;  Surgeon: Jamesetta So, MD;  Location: [facility];  Service: General;  Laterality: N/A;   Social History   Socioeconomic History  . Marital status: Married    Spouse name: Not on file  . Number of children: Not on file  . Years of education: Not on file  . Highest education level: Not on file  Occupational History  . Not on file  Tobacco Use  . Smoking status: Former Smoker    Packs/day: 0.25    Years: 10.00    Pack years: 2.50    Types: Cigarettes    Quit date: 08/11/2012    Years since quitting: 6.6  . Smokeless tobacco: Never Used  Substance and Sexual Activity  . Alcohol use: No  . Drug use: No  . Sexual activity: Yes    Birth control/protection: None  Other Topics Concern  . Not on file  Social History Narrative  . Not on file   Social Determinants of Health   Financial Resource Strain:   . Difficulty of Paying Living Expenses:   Food Insecurity:   . Worried About Charity fundraiser in the Last Year:   . Arboriculturist in the Last Year:   Transportation Needs:   . Film/video editor (Medical):   Marland Kitchen Lack of Transportation (Non-Medical):   Physical Activity:   . Days of Exercise per Week:   . Minutes of Exercise per Session:   Stress:   . Feeling of Stress :   Social Connections:   . Frequency of Communication with Friends and Family:   . Frequency of Social Gatherings with Friends and Family:   . Attends  Religious Services:   . Active Member of Clubs or Organizations:   . Attends Archivist Meetings:   Marland Kitchen Marital Status:    Outpatient Encounter Medications as of 03/31/2019  Medication Sig  . calcium-vitamin D (OSCAL) 250-125 MG-UNIT per tablet Take 1 tablet by mouth 2 (two) times daily.  Marland Kitchen levothyroxine (SYNTHROID) 88 MCG tablet TAKE ONE TABLET BY MOUTH DAILY BEFORE BREAKFAST.  . Multiple Vitamins-Minerals (MULTIVITAMINS THER. W/MINERALS) TABS tablet Take 1 tablet by mouth daily.  . naproxen (NAPROSYN) 500 MG tablet 1 tablet 1 day or 1 dose.  . rosuvastatin (CRESTOR) 20 MG tablet Take 20 mg by mouth daily.  . [DISCONTINUED] levothyroxine (SYNTHROID) 88 MCG tablet TAKE ONE TABLET BY MOUTH DAILY BEFORE BREAKFAST.   No facility-administered encounter medications on file as of 03/31/2019.   ALLERGIES: No Known Allergies VACCINATION STATUS:  There is no immunization history on file for this patient.  HPI   59 year old female  with medical history significant for multinodular goiter status post prior radioactive iodine ablation, and completion thyroidectomy on 08/15/13 with benign outcomes. She is currently on levothyroxine 88 mcg p.o. daily before breakfast for postsurgical hypothyroidism.  She is compliant to her medications.  She  has no new complaints.  Her previsit labs show thyroid function tests consistent with appropriate replacement, however A1c is 5.8% suggesting prediabetes.   She continues to feel better, no new complaints.    She denies cold/hot intolerance. -She has a steady energy level.  She has family hx of thyroid nodules , but no family hx of thyroid cancer.    Objective:    BP 100/66   Pulse (!) 56   Ht 5' 4.5" (1.638 m)   Wt 171 lb 6.4 oz (77.7 kg)   LMP 06/24/2013   BMI 28.97 kg/m   Wt Readings from Last 3 Encounters:  03/31/19 171 lb 6.4 oz (77.7 kg)  03/28/17 160 lb (72.6 kg)  03/28/16 160 lb (72.6 kg)     Physical Exam-  Limited  Constitutional:  Body mass index is 28.97 kg/m. , not in acute distress, normal state of mind Eyes:  EOMI, no exophthalmos Neck: Supple Thyroid: + Post thyroidectomy scar ,no gross goiter Respiratory: Adequate breathing efforts Musculoskeletal: no gross deformities, strength intact in all four extremities, no gross restriction of joint movements Skin:  no rashes, no hyperemia Neurological: no tremor with outstretched hands,     CMP     Component Value Date/Time   NA 141 08/16/2013 0547   K 4.0 08/16/2013 0547   CL 106 08/16/2013 0547   CO2 25 08/16/2013 0547   GLUCOSE 94 08/16/2013 0547   BUN 14 03/19/2019 0000   CREATININE 0.6 03/19/2019 0000   CREATININE 0.59 08/16/2013 0547   CALCIUM 8.3 (L) 08/16/2013 0547   PROT 5.9 (L) 08/16/2013 0547   ALBUMIN 3.2 (L) 08/16/2013 0547   AST 17 08/16/2013 0547   ALT 22 08/16/2013 0547   ALKPHOS 47 08/16/2013 0547   BILITOT 0.3 08/16/2013 0547   GFRNONAA >90 08/16/2013 0547   GFRAA >90 08/16/2013 0547   Recent Results (from the past 2160 hour(s))  Basic metabolic panel     Status: None   Collection Time: 03/19/19 12:00 AM  Result Value Ref Range   BUN 14 4 - 21   Creatinine 0.6 0.5 - 1.1  Lipid panel     Status: Abnormal   Collection Time: 03/19/19 12:00 AM  Result Value Ref Range   Triglycerides 188 (A) 40 - 160   Cholesterol 158 0 - 200   HDL 44 35 - 70   LDL Cholesterol 86   Hemoglobin A1c     Status: None   Collection Time: 03/19/19 12:00 AM  Result Value Ref Range   Hemoglobin A1C 5.8   TSH     Status: None   Collection Time: 03/19/19 12:00 AM  Result Value Ref Range   TSH 1.01 0.41 - 5.90  T4, Free     Status: None   Collection Time: 03/19/19  2:34 PM  Result Value Ref Range   Free T4 1.4 0.8 - 1.8 ng/dL     Assessment & Plan:   1. Postsurgical hypothyroidism 2.  Prediabetes  She is status post completion thyroidectomy.  Her previsit thyroid function tests are consistent with appropriate  replacement.  She is advised to continue levothyroxine 88 mcg p.o. daily before breakfast.     - We discussed about the correct intake of her thyroid hormone, on empty stomach at fasting, with water, separated by at least 30 minutes from breakfast and other medications,  and separated by more than 4 hours from calcium, iron, multivitamins, acid reflux medications (PPIs). -Patient is made aware of the fact  that thyroid hormone replacement is needed for life, dose to be adjusted by periodic monitoring of thyroid function tests.  Regarding her prediabetes with A1c of 5.8%, first documentation.  She is at risk for type 2 diabetes. - she  admits there is a room for improvement in her diet and drink choices. -  Suggestion is made for her to avoid simple carbohydrates  from her diet including Cakes, Sweet Desserts / Pastries, Ice Cream, Soda (diet and regular), Sweet Tea, Candies, Chips, Cookies, Sweet Pastries,  Store Bought Juices, Alcohol in Excess of  1-2 drinks a day, Artificial Sweeteners, Coffee Creamer, and "Sugar-free" Products. This will help patient to have stable blood glucose profile and potentially avoid unintended weight gain.   Regarding her hyperlipidemia. She is advised to continue Crestor 20 mg p.o. daily at bedtime.   - I advised patient to maintain close follow up with Lucia Gaskins, MD for primary care needs.    - Time spent on this patient care encounter:  25 minutes of which 50% was spent in  counseling and the rest reviewing  her current and  previous labs / studies and medications  doses and developing a plan for long term care. Rona Ravens Meine  participated in the discussions, expressed understanding, and voiced agreement with the above plans.  All questions were answered to her satisfaction. she is encouraged to contact clinic should she have any questions or concerns prior to her return visit.  Follow up plan: Return in about 6 months (around 10/01/2019) for Follow up with  Pre-visit Labs, Next Visit A1c in Office.  Glade Lloyd, MD Phone: [phone]  Fax: [phone]  -  This note was partially dictated with voice recognition software. Similar sounding words can be transcribed inadequately or may not  be corrected upon review.  03/31/2019, 9:53 AM

## 2019-03-31 NOTE — Patient Instructions (Signed)

## 2019-08-15 ENCOUNTER — Other Ambulatory Visit (HOSPITAL_COMMUNITY): Payer: Self-pay | Admitting: Family Medicine

## 2019-08-15 DIAGNOSIS — Z1231 Encounter for screening mammogram for malignant neoplasm of breast: Secondary | ICD-10-CM

## 2019-09-22 ENCOUNTER — Other Ambulatory Visit: Payer: Self-pay

## 2019-09-22 ENCOUNTER — Ambulatory Visit (HOSPITAL_COMMUNITY)
Admission: RE | Admit: 2019-09-22 | Discharge: 2019-09-22 | Disposition: A | Discharge status: Home / Self Care | Payer: 59 | Source: Ambulatory Visit | Attending: Family Medicine | Admitting: Family Medicine

## 2019-09-22 DIAGNOSIS — Z1231 Encounter for screening mammogram for malignant neoplasm of breast: Secondary | ICD-10-CM

## 2019-09-23 LAB — TSH: TSH: 1.5 (ref 0.41–5.90)

## 2019-09-23 LAB — COMPREHENSIVE METABOLIC PANEL: GFR calc non Af Amer: 83

## 2019-09-23 LAB — LIPID PANEL
Cholesterol: 158 (ref 0–200)
LDL Cholesterol: 85
Triglycerides: 179 — AB (ref 40–160)

## 2019-09-23 LAB — VITAMIN D 25 HYDROXY (VIT D DEFICIENCY, FRACTURES): Vit D, 25-Hydroxy: 96

## 2019-09-23 LAB — HEMOGLOBIN A1C: Hemoglobin A1C: 5.7

## 2019-09-24 ENCOUNTER — Encounter: Payer: Self-pay | Admitting: Emergency Medicine

## 2019-09-24 ENCOUNTER — Ambulatory Visit (INDEPENDENT_AMBULATORY_CARE_PROVIDER_SITE_OTHER): Payer: 59

## 2019-09-24 ENCOUNTER — Other Ambulatory Visit: Payer: Self-pay

## 2019-09-24 ENCOUNTER — Ambulatory Visit
Admission: EM | Admit: 2019-09-24 | Discharge: 2019-09-24 | Disposition: A | Discharge status: Home / Self Care | Payer: 59 | Attending: Emergency Medicine | Admitting: Emergency Medicine

## 2019-09-24 DIAGNOSIS — S52124A Nondisplaced fracture of head of right radius, initial encounter for closed fracture: Secondary | ICD-10-CM

## 2019-09-24 DIAGNOSIS — M79631 Pain in right forearm: Secondary | ICD-10-CM | POA: Diagnosis not present

## 2019-09-24 DIAGNOSIS — S61211A Laceration without foreign body of left index finger without damage to nail, initial encounter: Secondary | ICD-10-CM | POA: Diagnosis not present

## 2019-09-24 DIAGNOSIS — M79601 Pain in right arm: Secondary | ICD-10-CM | POA: Diagnosis not present

## 2019-09-24 MED ORDER — TRAMADOL HCL 50 MG PO TABS: 50.0000 mg | ORAL_TABLET | Freq: Two times a day (BID) | ORAL | 0 refills | Status: DC | PRN

## 2019-09-24 MED ORDER — TETANUS-DIPHTH-ACELL PERTUSSIS 5-2.5-18.5 LF-MCG/0.5 IM SUSP
0.5000 mL | Freq: Once | INTRAMUSCULAR | Status: AC
  Administered 2019-09-24: 0.5 mL via INTRAMUSCULAR

## 2019-09-24 MED ORDER — NAPROXEN 375 MG PO TABS: 375.0000 mg | ORAL_TABLET | Freq: Two times a day (BID) | ORAL | 0 refills | Status: DC

## 2019-09-24 MED ORDER — CEPHALEXIN 500 MG PO CAPS: 500.0000 mg | ORAL_CAPSULE | Freq: Four times a day (QID) | ORAL | 0 refills | Status: DC

## 2019-09-24 NOTE — ED Provider Notes (Signed)
[facility]   [id number] 09/24/19 Arrival Time: 1127  CC: RT elbow pain; LT finger laceration  SUBJECTIVE: History from: patient. LOLETTA HARPER is a 59 y.o. female complains of right elbow pain x 2 hours.  Reports fall from swivel chair while standing on it.  Localizes the pain to the outside of elbow.  Describes the pain as intermittent and catching in character.  Denies alleviating factors.  Symptoms are made worse with ROM.  Denies similar symptoms in the past.  Denies fever, chills, erythema, ecchymosis, effusion, weakness, numbness and tingling.  Also presents with a laceration that occurred 2 hours ago to left index finger.  Symptoms began after fall from swivel chair.  Bleeding controlled.  Currently not on blood thinners.  Denies similar symptoms in the past.  Denies fever, chills, nausea, vomiting, redness, swelling, purulent drainage.    Td UTD: Unknown.  ROS: As per HPI.  All other pertinent ROS negative.     Past Medical History:  Diagnosis Date  . Arthritis    Past Surgical History:  Procedure Laterality Date  . APPENDECTOMY    . Bilateral bunionectomy    . COLONOSCOPY N/A 01/01/2015   Procedure: COLONOSCOPY;  Surgeon: Danie Binder, MD;  Location: [facility];  Service: Endoscopy;  Laterality: N/A;  11:15 AM - pt knows to arrive at 11:15  . THYROIDECTOMY N/A 08/15/2013   Procedure: TOTAL THYROIDECTOMY;  Surgeon: Jamesetta So, MD;  Location: [facility];  Service: General;  Laterality: N/A;   No Known Allergies No current facility-administered medications on file prior to encounter.   Current Outpatient Medications on File Prior to Encounter  Medication Sig Dispense Refill  . calcium-vitamin D (OSCAL) 250-125 MG-UNIT per tablet Take 1 tablet by mouth 2 (two) times daily. 60 tablet 1  . levothyroxine (SYNTHROID) 88 MCG tablet TAKE ONE TABLET BY MOUTH DAILY BEFORE BREAKFAST. 90 tablet 1  . Multiple Vitamins-Minerals (MULTIVITAMINS THER. W/MINERALS) TABS tablet  Take 1 tablet by mouth daily.    . rosuvastatin (CRESTOR) 20 MG tablet Take 20 mg by mouth daily.     Social History   Socioeconomic History  . Marital status: Married    Spouse name: Not on file  . Number of children: Not on file  . Years of education: Not on file  . Highest education level: Not on file  Occupational History  . Not on file  Tobacco Use  . Smoking status: Former Smoker    Packs/day: 0.25    Years: 10.00    Pack years: 2.50    Types: Cigarettes    Quit date: 08/11/2012    Years since quitting: 7.1  . Smokeless tobacco: Never Used  Vaping Use  . Vaping Use: Never used  Substance and Sexual Activity  . Alcohol use: No  . Drug use: No  . Sexual activity: Yes    Birth control/protection: None  Other Topics Concern  . Not on file  Social History Narrative  . Not on file   Social Determinants of Health   Financial Resource Strain:   . Difficulty of Paying Living Expenses: Not on file  Food Insecurity:   . Worried About Charity fundraiser in the Last Year: Not on file  . Ran Out of Food in the Last Year: Not on file  Transportation Needs:   . Lack of Transportation (Medical): Not on file  . Lack of Transportation (Non-Medical): Not on file  Physical Activity:   . Days of Exercise  per Week: Not on file  . Minutes of Exercise per Session: Not on file  Stress:   . Feeling of Stress : Not on file  Social Connections:   . Frequency of Communication with Friends and Family: Not on file  . Frequency of Social Gatherings with Friends and Family: Not on file  . Attends Religious Services: Not on file  . Active Member of Clubs or Organizations: Not on file  . Attends Archivist Meetings: Not on file  . Marital Status: Not on file  Intimate Partner Violence:   . Fear of Current or Ex-Partner: Not on file  . Emotionally Abused: Not on file  . Physically Abused: Not on file  . Sexually Abused: Not on file   No family history on  file.  OBJECTIVE:  Vitals:   09/24/19 1143  BP: 139/90  Pulse: 68  Resp: 18  Temp: 98 F (36.7 C)  TempSrc: Oral  SpO2: 96%  Weight: 155 lb (70.3 kg)  Height: 5\' 4"  (1.626 m)    General appearance: ALERT; in no acute distress.  Head: NCAT Lungs: Normal respiratory effort CV: Radial pulse 2+ intact bilaterally Musculoskeletal: RT elbow Inspection: Skin warm, dry, clear and intact without obvious erythema, effusion, or ecchymosis.  Palpation: TTP over proximal radial head/ lateral elbow ROM: LROM about the elbow;  Strength: deferred Skin: laceration of LT second digit distal tip; size: approx 1 cm; ROM intact Neurologic: Ambulates without difficulty; Sensation intact about the upper extremities Psychological: alert and cooperative; normal mood and affect  DIAGNOSTIC STUDIES:  DG Forearm Right  Result Date: 09/24/2019 CLINICAL DATA:  Right forearm pain.  Fall. EXAM: RIGHT FOREARM - 2 VIEW COMPARISON:  None. FINDINGS: There is a right elbow joint effusion. Fracture noted through the radial neck. No additional forearm fracture. No subluxation or dislocation. IMPRESSION: Right radial neck fracture with right elbow joint effusion. Electronically Signed   By: Rolm Baptise M.D.   On: 09/24/2019 11:57    X-rays positive for radial neck fracture  I have reviewed the x-rays myself and the radiologist interpretation. I am in agreement with the radiologist interpretation.     Procedure: Verbal consent obtained. Patient provided with risks and alternatives to the procedure. Wound copiously irrigated with NS then cleansed with betadine. Anesthetized with 3 mL of lidocaine without epinephrine. Wound carefully explored. No foreign body, tendon injury, or nonviable tissue were noted. Using sterile technique 2 interrupted and 1 horizontal 5-0 Prolene sutures were placed to reapproximate the wound. Patient tolerated procedure well. No complications. Minimal bleeding. Patient advised to look for  and return for any signs of infection such as redness, swelling, discharge, or worsening pain. Return for suture removal in 10-12 days.  ASSESSMENT & PLAN:  1. Closed nondisplaced fracture of head of right radius, initial encounter   2. Right arm pain   3. Laceration of left index finger without foreign body without damage to nail, initial encounter     Meds ordered this encounter  Medications  . naproxen (NAPROSYN) 375 MG tablet    Sig: Take 1 tablet (375 mg total) by mouth 2 (two) times daily.    Dispense:  20 tablet    Refill:  0    Order Specific Question:   Supervising Provider    Answer:   Raylene Everts [[id number]]  . traMADol (ULTRAM) 50 MG tablet    Sig: Take 1 tablet (50 mg total) by mouth every 12 (twelve) hours as needed.  Dispense:  10 tablet    Refill:  0    Order Specific Question:   Supervising Provider    Answer:   Raylene Everts [[id number]]  . Tdap (BOOSTRIX) injection 0.5 mL   RT arm fracture:  X-rays showed radial head fracture Continue conservative management of rest, ice, and elevation Long posterior arm splint applied Take naproxen as needed for pain relief (may cause abdominal discomfort, ulcers, and GI bleeds avoid taking with other NSAIDs) Tramadol prescribed.  Take as needed for severe break-through pain Follow up with orthopedist for further evaluation and management Return or go to the ER if you have any new or worsening symptoms (fever, chills, chest pain, redness, swelling, bruising, deformity, bruising, etc...)   Finger laceration: Bandage applied Tetanus updated Keep covered for next and dry for next 24-48 hours.  After than you may gently clean with warm water and mild soap.  Avoid submerging wound in water. Change dressing daily and apply a thin layer of neosporin.  Keflex prescribed for possible infection.   Return in 10-12 days to have sutures removed.   Take OTC ibuprofen or tylenol as needed for pain relief Return sooner or go to  the ED if you have any new or worsening symptoms such as increased pain, redness, swelling, drainage, discharge, decreased range of motion of extremity, etc..     Reviewed expectations re: course of current medical issues. Questions answered. Outlined signs and symptoms indicating need for more acute intervention. Patient verbalized understanding. After Visit Summary given.    Lestine Box, PA-C 09/24/19 1300

## 2019-09-24 NOTE — Discharge Instructions (Signed)
RT arm fracture:  X-rays showed radial head fracture Continue conservative management of rest, ice, and elevation Long posterior arm splint applied Take naproxen as needed for pain relief (may cause abdominal discomfort, ulcers, and GI bleeds avoid taking with other NSAIDs) Tramadol prescribed.  Take as needed for severe break-through pain Follow up with orthopedist for further evaluation and management Return or go to the ER if you have any new or worsening symptoms (fever, chills, chest pain, redness, swelling, bruising, deformity, bruising, etc...)   Finger laceration: Bandage applied Tetanus updated Keep covered for next and dry for next 24-48 hours.  After than you may gently clean with warm water and mild soap.  Avoid submerging wound in water. Change dressing daily and apply a thin layer of neosporin.  Keflex prescribed for possible infection.   Return in 10-12 days to have sutures removed.   Take OTC ibuprofen or tylenol as needed for pain relief Return sooner or go to the ED if you have any new or worsening symptoms such as increased pain, redness, swelling, drainage, discharge, decreased range of motion of extremity, etc..

## 2019-09-24 NOTE — ED Triage Notes (Addendum)
Pain to RT forearm pain, abrasion to inner LT lower leg and laceration to LT index finger after falling off of a swivel chair she was standing in.

## 2019-10-01 ENCOUNTER — Encounter: Payer: Self-pay | Admitting: Nurse Practitioner

## 2019-10-01 ENCOUNTER — Other Ambulatory Visit: Payer: Self-pay

## 2019-10-01 ENCOUNTER — Ambulatory Visit (INDEPENDENT_AMBULATORY_CARE_PROVIDER_SITE_OTHER): Payer: 59 | Admitting: Nurse Practitioner

## 2019-10-01 VITALS — BP 107/73 | HR 74 | Ht 64.5 in | Wt 172.4 lb

## 2019-10-01 DIAGNOSIS — R7303 Prediabetes: Secondary | ICD-10-CM | POA: Diagnosis not present

## 2019-10-01 DIAGNOSIS — E890 Postprocedural hypothyroidism: Secondary | ICD-10-CM

## 2019-10-01 MED ORDER — LEVOTHYROXINE SODIUM 88 MCG PO TABS: ORAL_TABLET | ORAL | 1 refills | Status: DC

## 2019-10-01 NOTE — Patient Instructions (Signed)

## 2019-10-01 NOTE — Progress Notes (Signed)
10/01/2019   Endocrinology follow-up note   Subjective:    Patient ID: [name], female    DOB: 1960/09/21, PCP Lucia Gaskins, MD   Past Medical History:  Diagnosis Date   Arthritis    Past Surgical History:  Procedure Laterality Date   APPENDECTOMY     Bilateral bunionectomy     COLONOSCOPY N/A 01/01/2015   Procedure: COLONOSCOPY;  Surgeon: Danie Binder, MD;  Location: [facility];  Service: Endoscopy;  Laterality: N/A;  11:15 AM - pt knows to arrive at 11:15   THYROIDECTOMY N/A 08/15/2013   Procedure: TOTAL THYROIDECTOMY;  Surgeon: Jamesetta So, MD;  Location: [facility];  Service: General;  Laterality: N/A;   Social History   Socioeconomic History   Marital status: Married    Spouse name: Not on file   Number of children: Not on file   Years of education: Not on file   Highest education level: Not on file  Occupational History   Not on file  Tobacco Use   Smoking status: Former Smoker    Packs/day: 0.25    Years: 10.00    Pack years: 2.50    Types: Cigarettes    Quit date: 08/11/2012    Years since quitting: 7.1   Smokeless tobacco: Never Used  Vaping Use   Vaping Use: Never used  Substance and Sexual Activity   Alcohol use: No   Drug use: No   Sexual activity: Yes    Birth control/protection: None  Other Topics Concern   Not on file  Social History Narrative   Not on file   Social Determinants of Health   Financial Resource Strain:    Difficulty of Paying Living Expenses: Not on file  Food Insecurity:    Worried About Charity fundraiser in the Last Year: Not on file   [employer] of Food in the Last Year: Not on file  Transportation Needs:    Lack of Transportation (Medical): Not on file   Lack of Transportation (Non-Medical): Not on file  Physical Activity:    Days of Exercise per Week: Not on file   Minutes of Exercise per Session: Not on file  Stress:    Feeling of Stress : Not on file  Social Connections:     Frequency of Communication with Friends and Family: Not on file   Frequency of Social Gatherings with Friends and Family: Not on file   Attends Religious Services: Not on file   Active Member of Clubs or Organizations: Not on file   Attends Archivist Meetings: Not on file   Marital Status: Not on file   Outpatient Encounter Medications as of 10/01/2019  Medication Sig   calcium-vitamin D (OSCAL) 250-125 MG-UNIT per tablet Take 1 tablet by mouth 2 (two) times daily.   levothyroxine (SYNTHROID) 88 MCG tablet TAKE ONE TABLET BY MOUTH DAILY BEFORE BREAKFAST.   Multiple Vitamins-Minerals (MULTIVITAMINS THER. W/MINERALS) TABS tablet Take 1 tablet by mouth daily.   naproxen (NAPROSYN) 375 MG tablet Take 1 tablet (375 mg total) by mouth 2 (two) times daily.   rosuvastatin (CRESTOR) 20 MG tablet Take 20 mg by mouth daily.   [DISCONTINUED] levothyroxine (SYNTHROID) 88 MCG tablet TAKE ONE TABLET BY MOUTH DAILY BEFORE BREAKFAST.   [DISCONTINUED] cephALEXin (KEFLEX) 500 MG capsule Take 1 capsule (500 mg total) by mouth 4 (four) times daily.   [DISCONTINUED] traMADol (ULTRAM) 50 MG tablet Take 1 tablet (50 mg total) by mouth every 12 (twelve)  hours as needed.   No facility-administered encounter medications on file as of 10/01/2019.   ALLERGIES: No Known Allergies VACCINATION STATUS: Immunization History  Administered Date(s) Administered   Tdap 09/24/2019    Thyroid Problem Presents for follow-up visit. Patient reports no anxiety, cold intolerance, constipation, depressed mood, diarrhea, dry skin, fatigue, hair loss, heat intolerance, leg swelling, palpitations, tremors, weight gain or weight loss. The symptoms have been stable.    59 year old female  with medical history significant for multinodular goiter status post prior radioactive iodine ablation, and completion thyroidectomy on 08/15/13 with benign outcomes. She is currently on levothyroxine 88 mcg p.o. daily  before breakfast for postsurgical hypothyroidism.  She is compliant to her medications.  She has been stable on this medication for quite some time now.  She has no new complaints today.   -She denies cold/hot intolerance. -She has a steady energy level.  She has family hx of thyroid nodules, but no family hx of thyroid cancer.  She was diagnosed with prediabetes on last visit with A1C of 5.8%.  Objective:    BP 107/73 (BP Location: Left Arm, Patient Position: Sitting)    Pulse 74    Ht 5' 4.5" (1.638 m)    Wt 172 lb 6.4 oz (78.2 kg)    LMP 06/24/2013    BMI 29.14 kg/m   Wt Readings from Last 3 Encounters:  10/01/19 172 lb 6.4 oz (78.2 kg)  09/24/19 155 lb (70.3 kg)  03/31/19 171 lb 6.4 oz (77.7 kg)    BP Readings from Last 3 Encounters:  10/01/19 107/73  09/24/19 139/90  03/31/19 100/66    Physical Exam- Limited  Constitutional:  Body mass index is 29.14 kg/m. , not in acute distress, normal state of mind Eyes:  EOMI, no exophthalmos Neck: Supple Thyroid: + Post thyroidectomy scar ,no gross goiter Respiratory: Adequate breathing efforts Musculoskeletal: no gross deformities, in RUE splint and sling for break after recent fall, in ortho boot to LLE for fracture during recent fall Skin:  no rashes, no hyperemia Neurological: no tremor with outstretched hands,     CMP     Component Value Date/Time   NA 141 08/16/2013 0547   K 4.0 08/16/2013 0547   CL 106 08/16/2013 0547   CO2 25 08/16/2013 0547   GLUCOSE 94 08/16/2013 0547   BUN 14 03/19/2019 0000   CREATININE 0.6 03/19/2019 0000   CREATININE 0.59 08/16/2013 0547   CALCIUM 8.3 (L) 08/16/2013 0547   PROT 5.9 (L) 08/16/2013 0547   ALBUMIN 3.2 (L) 08/16/2013 0547   AST 17 08/16/2013 0547   ALT 22 08/16/2013 0547   ALKPHOS 47 08/16/2013 0547   BILITOT 0.3 08/16/2013 0547   GFRNONAA >90 08/16/2013 0547   GFRAA >90 08/16/2013 0547   No results found for this or any previous visit (from the past 2160  hour(s)).   Assessment & Plan:   1. Postsurgical hypothyroidism -s/p total thyroidectomy Her previsit thyroid function tests are consistent with appropriate replacement.  She is advised to continue levothyroxine 88 mcg p.o. daily before breakfast. Will recheck TFTs prior to next visit in 1 year.    - We discussed about the correct intake of her thyroid hormone, on empty stomach at fasting, with water, separated by at least 30 minutes from breakfast and other medications,  and separated by more than 4 hours from calcium, iron, multivitamins, acid reflux medications (PPIs). -Patient is made aware of the fact that thyroid hormone replacement is needed for life, dose  to be adjusted by periodic monitoring of thyroid function tests.  2. Prediabetes Her A1C has improved to 5.7% with diet and exercise alone.  She is still at risk for type 2 diabetes. - she  admits there is a room for improvement in her diet and drink choices. -  Suggestion is made for her to avoid simple carbohydrates  from her diet including Cakes, Sweet Desserts / Pastries, Ice Cream, Soda (diet and regular), Sweet Tea, Candies, Chips, Cookies, Sweet Pastries,  Store Bought Juices, Alcohol in Excess of  1-2 drinks a day, Artificial Sweeteners, Coffee Creamer, and "Sugar-free" Products. This will help patient to have stable blood glucose profile and potentially avoid unintended weight gain.  - I advised patient to maintain close follow up with Lucia Gaskins, MD for primary care needs.     - Time spent on this patient care encounter:  20 minutes of which 50% was spent in  counseling and the rest reviewing  her current and  previous labs / studies and medications  doses and developing a plan for long term care. Rona Ravens Rieves  participated in the discussions, expressed understanding, and voiced agreement with the above plans.  All questions were answered to her satisfaction. she is encouraged to contact clinic should she have any  questions or concerns prior to her return visit.  Follow up plan: Return in about 1 year (around 09/30/2020) for Thyroid follow up, Previsit labs.  Rayetta Pigg, [facility] [address] [place], [place] [address] Phone: [phone] Fax: [phone]  10/01/2019, 11:04 AM

## 2019-10-02 ENCOUNTER — Ambulatory Visit: Payer: 59 | Admitting: Nurse Practitioner

## 2019-10-03 ENCOUNTER — Ambulatory Visit
Admission: EM | Admit: 2019-10-03 | Discharge: 2019-10-03 | Disposition: A | Discharge status: Home / Self Care | Payer: 59 | Attending: Emergency Medicine | Admitting: Emergency Medicine

## 2019-10-03 ENCOUNTER — Other Ambulatory Visit: Payer: Self-pay

## 2019-10-03 DIAGNOSIS — Z4802 Encounter for removal of sutures: Secondary | ICD-10-CM

## 2019-10-03 NOTE — ED Triage Notes (Signed)
3 sutures removed left finger, skin well approximated

## 2019-10-10 IMAGING — MG DIGITAL SCREENING BILATERAL MAMMOGRAM WITH TOMO AND CAD
8 series · 8 of 24 positions shown · non-contrast
Comparison: Previous exam(s).

CLINICAL DATA: Screening.

EXAM:
DIGITAL SCREENING BILATERAL MAMMOGRAM WITH TOMO AND CAD

[L MLO synth-2D]
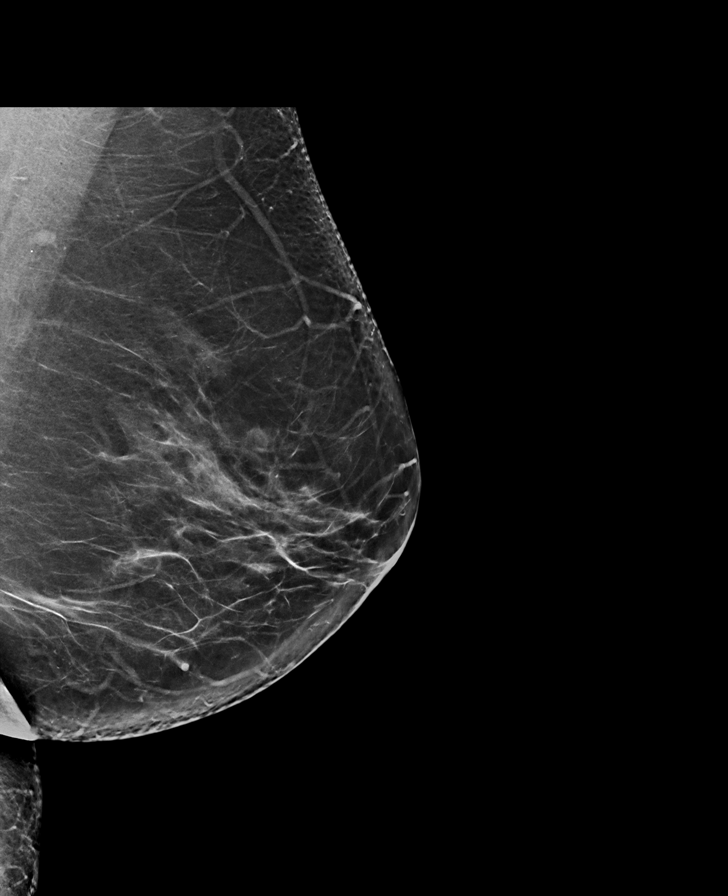

[R CC synth-2D]
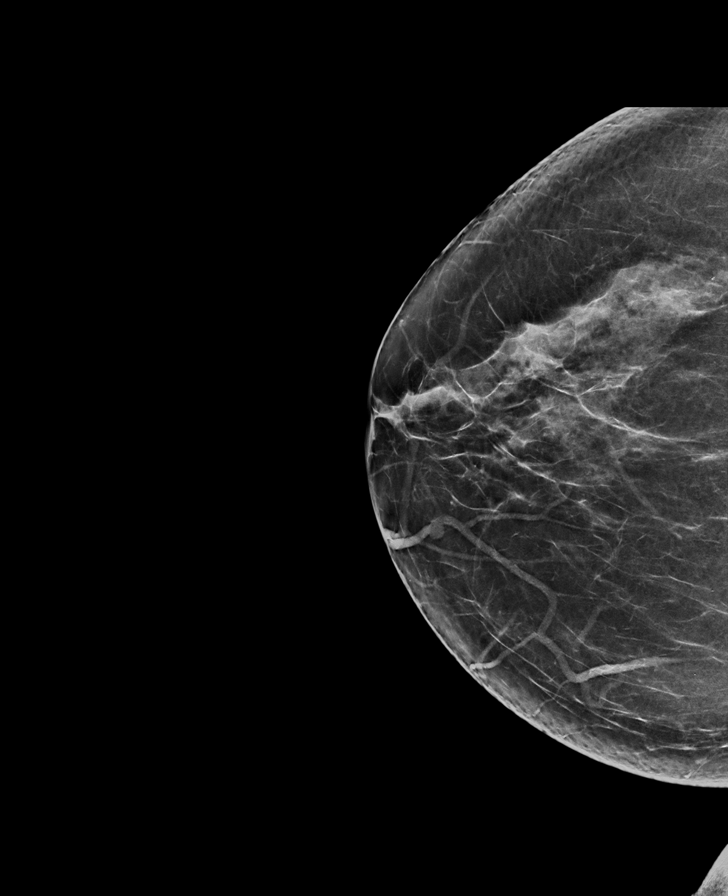

[L CC synth-2D]
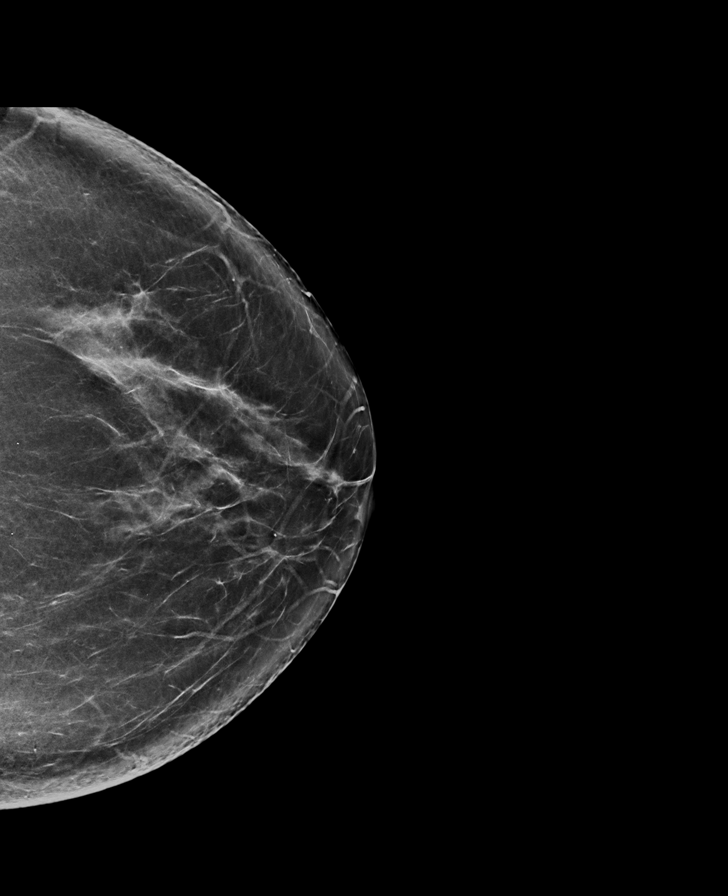

[R MLO synth-2D]
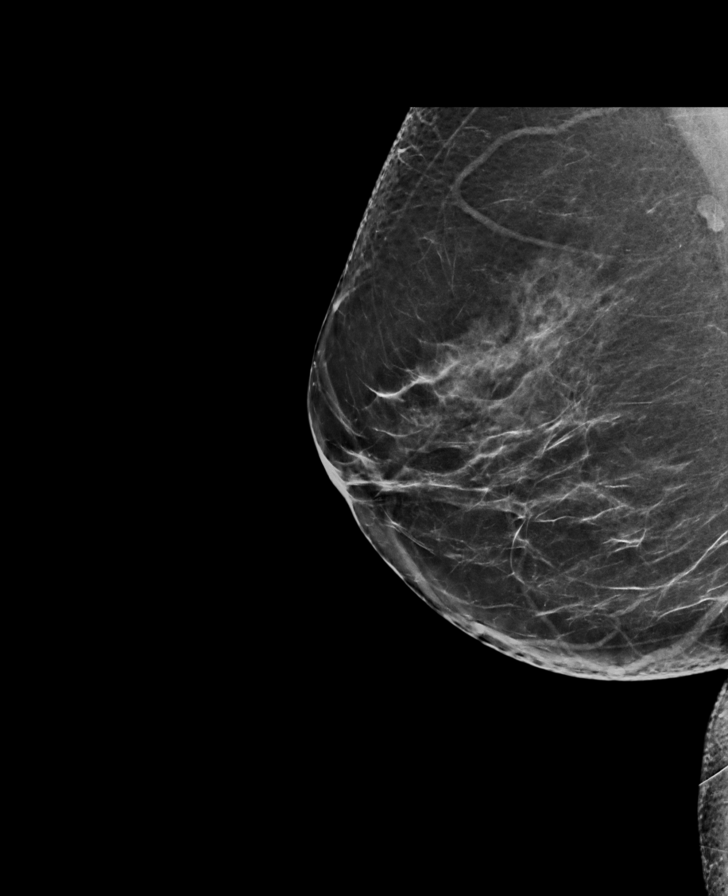

[L MLO tomo · tomo slice 44/87.0]
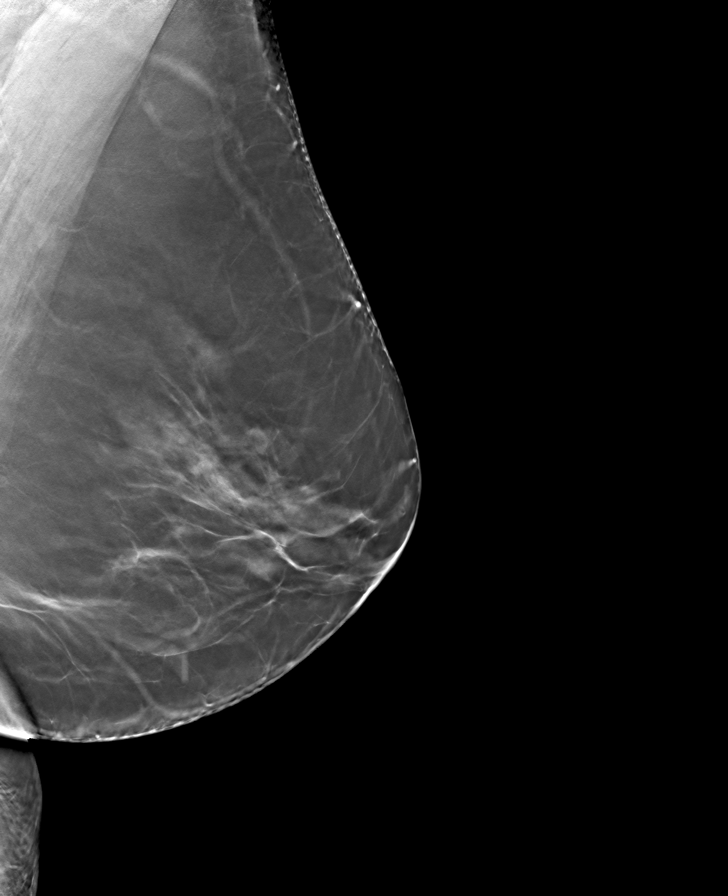

[R CC tomo · tomo slice 38/75.0]
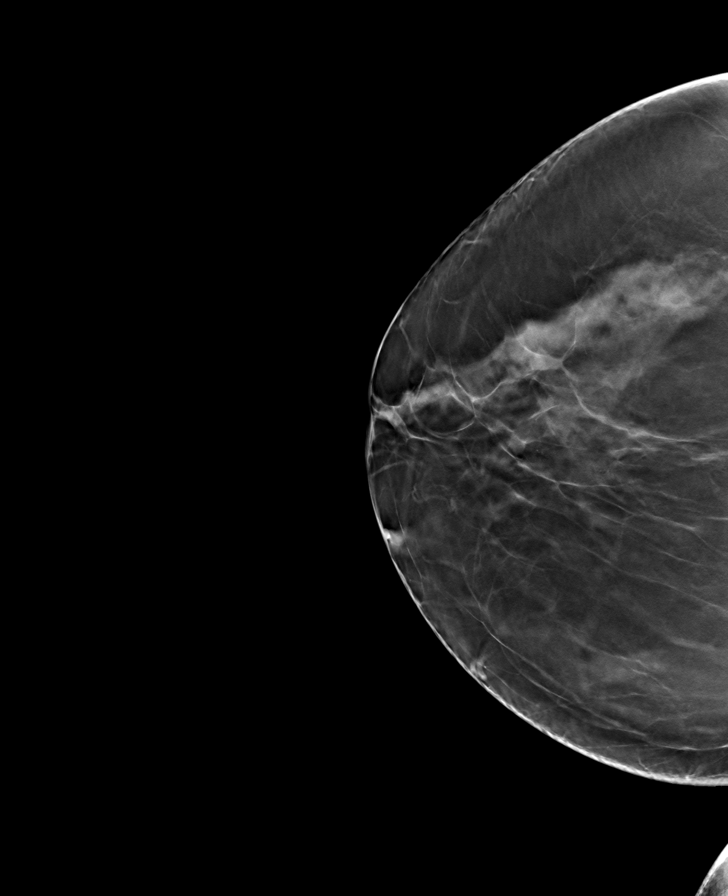

[L CC tomo · tomo slice 40/79.0]
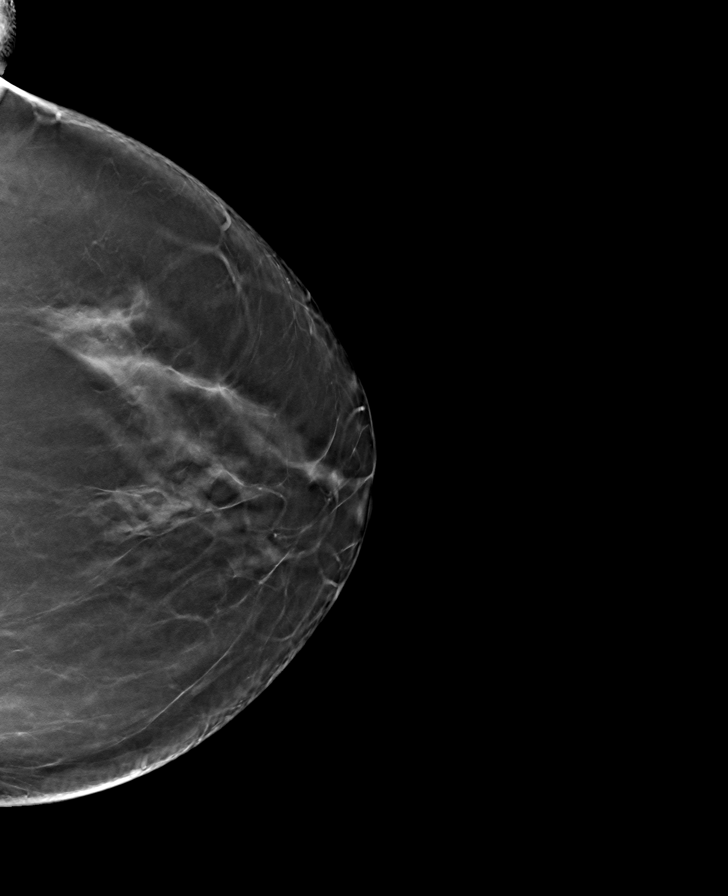

[R MLO tomo · tomo slice 40/79.0]
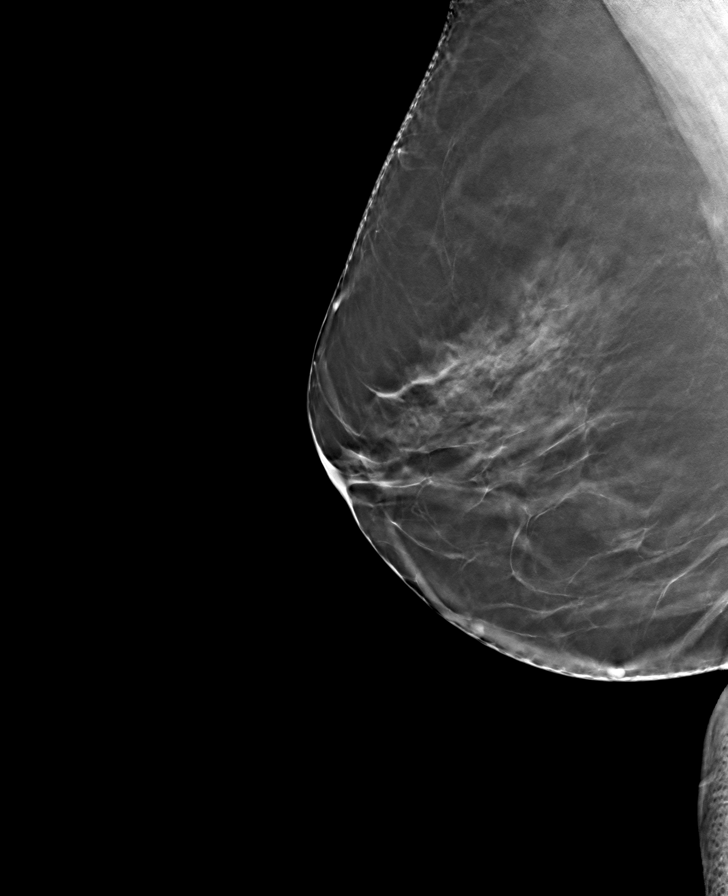

[8 of 24 positions shown; findings below may reference images not displayed]

ACR Breast Density Category b: There are scattered areas of
fibroglandular density.
FINDINGS: There are no findings suspicious for malignancy. Images were
processed with CAD.
IMPRESSION: No mammographic evidence of malignancy. A result letter of this
screening mammogram will be mailed directly to the patient.

RECOMMENDATION:
Screening mammogram in one year. (Code:[id number]-775)

BI-RADS CATEGORY  1: Negative.

## 2019-12-03 ENCOUNTER — Ambulatory Visit (HOSPITAL_COMMUNITY)
Admission: RE | Admit: 2019-12-03 | Discharge: 2019-12-03 | Disposition: A | Discharge status: Home / Self Care | Payer: 59 | Source: Ambulatory Visit | Attending: Family Medicine | Admitting: Family Medicine

## 2019-12-03 ENCOUNTER — Other Ambulatory Visit: Payer: Self-pay

## 2019-12-03 DIAGNOSIS — Z1231 Encounter for screening mammogram for malignant neoplasm of breast: Secondary | ICD-10-CM | POA: Insufficient documentation

## 2019-12-22 ENCOUNTER — Encounter: Payer: Self-pay | Admitting: Internal Medicine

## 2020-03-09 ENCOUNTER — Other Ambulatory Visit: Payer: Self-pay | Admitting: Nurse Practitioner

## 2020-05-06 ENCOUNTER — Encounter: Payer: Self-pay | Admitting: Obstetrics & Gynecology

## 2020-05-06 ENCOUNTER — Ambulatory Visit (INDEPENDENT_AMBULATORY_CARE_PROVIDER_SITE_OTHER): Payer: 59 | Admitting: Obstetrics & Gynecology

## 2020-05-06 ENCOUNTER — Other Ambulatory Visit: Payer: Self-pay

## 2020-05-06 ENCOUNTER — Other Ambulatory Visit (HOSPITAL_COMMUNITY)
Admission: RE | Admit: 2020-05-06 | Discharge: 2020-05-06 | Disposition: A | Discharge status: Home / Self Care | Payer: 59 | Source: Ambulatory Visit | Attending: Obstetrics & Gynecology | Admitting: Obstetrics & Gynecology

## 2020-05-06 VITALS — BP 106/74 | HR 82 | Ht 64.0 in | Wt 167.8 lb

## 2020-05-06 DIAGNOSIS — Z01419 Encounter for gynecological examination (general) (routine) without abnormal findings: Secondary | ICD-10-CM

## 2020-05-06 NOTE — Progress Notes (Signed)
   WELL-WOMAN EXAMINATION Patient name: [name] MRN [id number]  Date of birth: 16-Sep-1960 Chief Complaint:   Gynecologic Exam  History of Present Illness:   [name] is a 60 y.o. postmenopausal female being seen today for a routine well-woman exam.  Today she notes: no acute complaints or concerns   Last pap several years ago.  Last mammogram: yearly- last completed 12/2019 Last colonoscopy: 2016  Depression screen [facility] 2/9 05/06/2020 03/28/2017 03/28/2016 09/23/2015 03/18/2015  Decreased Interest 0 0 0 0 0  Down, Depressed, Hopeless 0 0 0 0 0  PHQ - 2 Score 0 0 0 0 0  Altered sleeping 0 - - - -  Tired, decreased energy 0 - - - -  Change in appetite 0 - - - -  Feeling bad or failure about yourself  0 - - - -  Trouble concentrating 0 - - - -  Moving slowly or fidgety/restless 0 - - - -  Suicidal thoughts 0 - - - -  PHQ-9 Score 0 - - - -      Review of Systems:   Pertinent items are noted in HPI Denies any headaches, blurred vision, fatigue, shortness of breath, chest pain, abdominal pain, bowel movements, urination, or intercourse unless otherwise stated above.  Pertinent History Reviewed:  Reviewed past medical,surgical, social and family history.  Reviewed problem list, medications and allergies. Physical Assessment:   Vitals:   05/06/20 1133  BP: 106/74  Pulse: 82  Weight: 167 lb 12.8 oz (76.1 kg)  Height: 5\' 4"  (1.626 m)  Body mass index is 28.8 kg/m.        Physical Examination:   General appearance - well appearing, and in no distress  Mental status - alert, oriented to person, place, and time  Psych:  She has a normal mood and affect  Skin - warm and dry, normal color, no suspicious lesions noted  Chest - effort normal, all lung fields clear to auscultation bilaterally  Heart - normal rate and regular rhythm  Neck:  midline trachea, thyroid absent  Breasts - breasts appear normal, no suspicious masses, no skin or nipple changes or  axillary  nodes  Abdomen - soft, nontender, nondistended, no masses or organomegaly  Pelvic - VULVA: normal appearing vulva with no masses, tenderness or lesions  VAGINA: normal appearing vagina with normal color and discharge, no lesions  CERVIX: normal appearing cervix without discharge or lesions, no CMT  Thin prep pap is done with HR HPV cotesting  UTERUS: uterus is felt to be normal size, shape, consistency and nontender   ADNEXA: No adnexal masses or tenderness noted.  Extremities:  No swelling or varicosities noted  Chaperone: Celene Squibb     Assessment & Plan:  1) Well-Woman Exam -pap collected, reviewed screening guidelines -encouraged pt to complete mammogram yearly  2) Sun exposure/tanning bed use -reviewed with patient risk of skin cancer -encouraged pt to also see dermatology for yearly check and/or if she should see any abnormal moles/skin changes  No orders of the defined types were placed in this encounter.   Follow-up: Return in about 1 year (around 05/06/2021), or if symptoms worsen or fail to improve, for Annual.   Janyth Pupa, DO Attending [place], [place] for [facility], [place]

## 2020-05-10 LAB — CYTOLOGY - PAP
Comment: NEGATIVE
Diagnosis: NEGATIVE
High risk HPV: NEGATIVE

## 2020-05-11 ENCOUNTER — Telehealth: Payer: Self-pay | Admitting: *Deleted

## 2020-05-11 NOTE — Telephone Encounter (Signed)
-----   Message from Janyth Pupa, DO sent at 05/10/2020  6:34 PM EDT ----- Pap/HPV negative, pt does not have MyChart ----- Message ----- From: Interface, Lab In [place] Sent: 05/10/2020   1:36 PM EDT To: Janyth Pupa, DO

## 2020-05-11 NOTE — Telephone Encounter (Signed)
Pt aware pap/HPV is negative. Pt voiced understanding. [place]

## 2020-09-28 LAB — COMPREHENSIVE METABOLIC PANEL
ALT: 24 IU/L (ref 0–32)
AST: 17 IU/L (ref 0–40)
Albumin/Globulin Ratio: 2.2 (ref 1.2–2.2)
Albumin: 4.6 g/dL (ref 3.8–4.9)
Alkaline Phosphatase: 69 IU/L (ref 44–121)
BUN/Creatinine Ratio: 23 (ref 9–23)
BUN: 15 mg/dL (ref 6–24)
Bilirubin Total: 0.3 mg/dL (ref 0.0–1.2)
CO2: 23 mmol/L (ref 20–29)
Calcium: 9.7 mg/dL (ref 8.7–10.2)
Chloride: 104 mmol/L (ref 96–106)
Creatinine, Ser: 0.64 mg/dL (ref 0.57–1.00)
Globulin, Total: 2.1 g/dL (ref 1.5–4.5)
Glucose: 95 mg/dL (ref 70–99)
Potassium: 4.7 mmol/L (ref 3.5–5.2)
Sodium: 140 mmol/L (ref 134–144)
Total Protein: 6.7 g/dL (ref 6.0–8.5)
eGFR: 102 mL/min/{1.73_m2} (ref >59)

## 2020-09-28 LAB — TSH: TSH: 1.04 u[IU]/mL (ref 0.450–4.500)

## 2020-09-28 LAB — HEMOGLOBIN A1C
Est. average glucose Bld gHb Est-mCnc: 126 mg/dL
Hgb A1c MFr Bld: 6 % — ABNORMAL HIGH (ref 4.8–5.6)

## 2020-09-28 LAB — VITAMIN D 25 HYDROXY (VIT D DEFICIENCY, FRACTURES): Vit D, 25-Hydroxy: 104 ng/mL — ABNORMAL HIGH (ref 30.0–100.0)

## 2020-09-28 LAB — T4, FREE: Free T4: 1.79 ng/dL — ABNORMAL HIGH (ref 0.82–1.77)

## 2020-09-30 ENCOUNTER — Encounter: Payer: Self-pay | Admitting: Nurse Practitioner

## 2020-09-30 ENCOUNTER — Ambulatory Visit: Payer: 59 | Admitting: Nurse Practitioner

## 2020-09-30 ENCOUNTER — Other Ambulatory Visit: Payer: Self-pay

## 2020-09-30 VITALS — BP 106/68 | HR 75 | Ht 64.0 in | Wt 166.0 lb

## 2020-09-30 DIAGNOSIS — E673 Hypervitaminosis D: Secondary | ICD-10-CM

## 2020-09-30 DIAGNOSIS — E890 Postprocedural hypothyroidism: Secondary | ICD-10-CM

## 2020-09-30 DIAGNOSIS — R7303 Prediabetes: Secondary | ICD-10-CM | POA: Diagnosis not present

## 2020-09-30 MED ORDER — LEVOTHYROXINE SODIUM 88 MCG PO TABS: 88.0000 ug | ORAL_TABLET | Freq: Every day | ORAL | 3 refills | Status: DC

## 2020-09-30 NOTE — Progress Notes (Signed)
09/30/2020   Endocrinology follow-up note   Subjective:    Patient ID: [name], female    DOB: [birth year], PCP Lucia Gaskins, MD   Past Medical History:  Diagnosis Date   Arthritis    Past Surgical History:  Procedure Laterality Date   APPENDECTOMY     Bilateral bunionectomy     COLONOSCOPY N/A 01/01/2015   Procedure: COLONOSCOPY;  Surgeon: Danie Binder, MD;  Location: [facility];  Service: Endoscopy;  Laterality: N/A;  11:15 AM - pt knows to arrive at 11:15   THYROIDECTOMY N/A 08/15/2013   Procedure: TOTAL THYROIDECTOMY;  Surgeon: Jamesetta So, MD;  Location: [facility];  Service: General;  Laterality: N/A;   Social History   Socioeconomic History   Marital status: Married    Spouse name: Not on file   Number of children: Not on file   Years of education: Not on file   Highest education level: Not on file  Occupational History   Not on file  Tobacco Use   Smoking status: Former    Packs/day: 0.25    Years: 10.00    Pack years: 2.50    Types: Cigarettes    Quit date: 08/11/2012    Years since quitting: 8.1   Smokeless tobacco: Never  Vaping Use   Vaping Use: Never used  Substance and Sexual Activity   Alcohol use: No   Drug use: No   Sexual activity: Yes    Birth control/protection: None  Other Topics Concern   Not on file  Social History Narrative   Not on file   Social Determinants of Health   Financial Resource Strain: Low Risk    Difficulty of Paying Living Expenses: Not hard at all  Food Insecurity: No Food Insecurity   Worried About Charity fundraiser in the Last Year: Never true   [place] in the Last Year: Never true  Transportation Needs: No Transportation Needs   Lack of Transportation (Medical): No   Lack of Transportation (Non-Medical): No  Physical Activity: Sufficiently Active   Days of Exercise per Week: 5 days   Minutes of Exercise per Session: 30 min  Stress: No Stress Concern Present   Feeling of Stress : Not at  all  Social Connections: Moderately Isolated   Frequency of Communication with Friends and Family: More than three times a week   Frequency of Social Gatherings with Friends and Family: More than three times a week   Attends Religious Services: Never   Marine scientist or Organizations: No   Attends Archivist Meetings: Never   Marital Status: Living with partner   Outpatient Encounter Medications as of 09/30/2020  Medication Sig   Multiple Vitamins-Minerals (MULTIVITAMINS THER. W/MINERALS) TABS tablet Take 1 tablet by mouth daily.   naproxen (NAPROSYN) 500 MG tablet Take 500 mg by mouth 2 (two) times daily.   rosuvastatin (CRESTOR) 20 MG tablet Take 20 mg by mouth daily.   [DISCONTINUED] levothyroxine (SYNTHROID) 88 MCG tablet TAKE ONE TABLET BY MOUTH DAILY BEFORE BREAKFAST.   levothyroxine (SYNTHROID) 88 MCG tablet Take 1 tablet (88 mcg total) by mouth daily before breakfast.   [DISCONTINUED] calcium-vitamin D (OSCAL) 250-125 MG-UNIT per tablet Take 1 tablet by mouth 2 (two) times daily. (Patient not taking: No sig reported)   [DISCONTINUED] naproxen (NAPROSYN) 375 MG tablet Take 1 tablet (375 mg total) by mouth 2 (two) times daily. (Patient not taking: Reported on 09/30/2020)   No facility-administered  encounter medications on file as of 09/30/2020.   ALLERGIES: No Known Allergies VACCINATION STATUS: Immunization History  Administered Date(s) Administered   Tdap 09/24/2019    Thyroid Problem Presents for follow-up visit. Patient reports no anxiety, cold intolerance, constipation, depressed mood, diarrhea, dry skin, fatigue, hair loss, heat intolerance, leg swelling, palpitations, tremors, weight gain or weight loss. The symptoms have been stable.    60 year old female  with medical history significant for multinodular goiter status post prior radioactive iodine ablation, and completion thyroidectomy on 08/15/13 with benign outcomes. She is currently on Levothyroxine  88 mcg p.o. daily before breakfast for postsurgical hypothyroidism.  She is compliant to her medications.  She has been stable on this medication for quite some time now.  She has no new complaints today.   -She denies cold/hot intolerance. -She has a steady energy level.  She has family hx of thyroid nodules, but no family hx of thyroid cancer.  She was diagnosed with prediabetes on last visit with A1C of 5.8%.  Review of systems  Constitutional: + Minimally fluctuating body weight,  current Body mass index is 28.49 kg/m. , no fatigue, no subjective hyperthermia, no subjective hypothermia Eyes: no blurry vision, no xerophthalmia ENT: no sore throat, no nodules palpated in throat, no dysphagia/odynophagia, no hoarseness Cardiovascular: no chest pain, no shortness of breath, no palpitations, no leg swelling Respiratory: no cough, no shortness of breath Gastrointestinal: no nausea/vomiting/diarrhea Musculoskeletal: no muscle/joint aches Skin: no rashes, no hyperemia Neurological: no tremors, no numbness, no tingling, no dizziness Psychiatric: no depression, no anxiety  Objective:    BP 106/68   Pulse 75   Ht _0  (1.626 m)   Wt 166 lb (75.3 kg)   LMP 06/24/2013   BMI 28.49 kg/m   Wt Readings from Last 3 Encounters:  09/30/20 166 lb (75.3 kg)  05/06/20 167 lb 12.8 oz (76.1 kg)  10/01/19 172 lb 6.4 oz (78.2 kg)    BP Readings from Last 3 Encounters:  09/30/20 106/68  05/06/20 106/74  10/01/19 107/73     Physical Exam- Limited  Constitutional:  Body mass index is 28.49 kg/m. , not in acute distress, normal state of mind Eyes:  EOMI, no exophthalmos Neck: Supple Thyroid: post thyroidectomy scar Cardiovascular: RRR, no murmurs, rubs, or gallops, no edema Respiratory: Adequate breathing efforts, no crackles, rales, rhonchi, or wheezing Musculoskeletal: no gross deformities, strength intact in all four extremities, no gross restriction of joint movements Skin:  no  rashes, no hyperemia Neurological: no tremor with outstretched hands    CMP     Component Value Date/Time   NA 140 09/27/2020 1037   K 4.7 09/27/2020 1037   CL 104 09/27/2020 1037   CO2 23 09/27/2020 1037   GLUCOSE 95 09/27/2020 1037   GLUCOSE 94 08/16/2013 0547   BUN 15 09/27/2020 1037   CREATININE 0.64 09/27/2020 1037   CALCIUM 9.7 09/27/2020 1037   PROT 6.7 09/27/2020 1037   ALBUMIN 4.6 09/27/2020 1037   AST 17 09/27/2020 1037   ALT 24 09/27/2020 1037   ALKPHOS 69 09/27/2020 1037   BILITOT 0.3 09/27/2020 1037   GFRNONAA 83 09/23/2019 0000   GFRAA >90 08/16/2013 0547   Recent Results (from the past 2160 hour(s))  Comprehensive metabolic panel     Status: None   Collection Time: 09/27/20 10:37 AM  Result Value Ref Range   Glucose 95 70 - 99 mg/dL    Comment:               **  Please note reference interval change**   BUN 15 6 - 24 mg/dL   Creatinine, Ser 0.64 0.57 - 1.00 mg/dL   eGFR 102 >59 mL/min/1.73   BUN/Creatinine Ratio 23 9 - 23   Sodium 140 134 - 144 mmol/L   Potassium 4.7 3.5 - 5.2 mmol/L   Chloride 104 96 - 106 mmol/L   CO2 23 20 - 29 mmol/L   Calcium 9.7 8.7 - 10.2 mg/dL   Total Protein 6.7 6.0 - 8.5 g/dL   Albumin 4.6 3.8 - 4.9 g/dL   Globulin, Total 2.1 1.5 - 4.5 g/dL   Albumin/Globulin Ratio 2.2 1.2 - 2.2   Bilirubin Total 0.3 0.0 - 1.2 mg/dL   Alkaline Phosphatase 69 44 - 121 IU/L   AST 17 0 - 40 IU/L   ALT 24 0 - 32 IU/L  TSH     Status: None   Collection Time: 09/27/20 10:37 AM  Result Value Ref Range   TSH 1.040 0.450 - 4.500 uIU/mL  T4, free     Status: Abnormal   Collection Time: 09/27/20 10:37 AM  Result Value Ref Range   Free T4 1.79 (H) 0.82 - 1.77 ng/dL  Hemoglobin A1c     Status: Abnormal   Collection Time: 09/27/20 10:37 AM  Result Value Ref Range   Hgb A1c MFr Bld 6.0 (H) 4.8 - 5.6 %    Comment:          Prediabetes: 5.7 - 6.4          Diabetes: >6.4          Glycemic control for adults with diabetes: <7.0    Est. average  glucose Bld gHb Est-mCnc 126 mg/dL  VITAMIN D 25 Hydroxy (Vit-D Deficiency, Fractures)     Status: Abnormal   Collection Time: 09/27/20 10:37 AM  Result Value Ref Range   Vit D, 25-Hydroxy 104.0 (H) 30.0 - 100.0 ng/mL    Comment: Vitamin D deficiency has been defined by the [place] and an Endocrine Society practice guideline as a level of serum 25-OH vitamin D less than 20 ng/mL (1,2). The Endocrine Society went on to further define vitamin D insufficiency as a level between 21 and 29 ng/mL (2). 1. IOM (Institute of Medicine). 2010. Dietary reference    intakes for calcium and D. [place]: The    [employer]. 2. Holick MF, Binkley [place], Bischoff-Ferrari HA, et al.    Evaluation, treatment, and prevention of vitamin D    deficiency: an Endocrine Society clinical practice    guideline. JCEM. 2011 Jul; 96(7):1911-30.      Assessment & Plan:   1. Postsurgical hypothyroidism -s/p total thyroidectomy Her previsit thyroid function tests are consistent with appropriate replacement.  She is advised to continue levothyroxine 88 mcg p.o. daily before breakfast. Will recheck TFTs prior to next visit in 1 year.    - We discussed about the correct intake of her thyroid hormone, on empty stomach at fasting, with water, separated by at least 30 minutes from breakfast and other medications,  and separated by more than 4 hours from calcium, iron, multivitamins, acid reflux medications (PPIs). -Patient is made aware of the fact that thyroid hormone replacement is needed for life, dose to be adjusted by periodic monitoring of thyroid function tests.  2. Prediabetes Her previsit A1C is 6%, slightly up from last visit of 5.7%.  Still in the prediabetes category.  - Nutritional counseling repeated at each appointment due to patients tendency  to fall back in to old habits.  - The patient admits there is a room for improvement in their diet and drink choices. -  Suggestion is  made for the patient to avoid simple carbohydrates from their diet including Cakes, Sweet Desserts / Pastries, Ice Cream, Soda (diet and regular), Sweet Tea, Candies, Chips, Cookies, Sweet Pastries, Store Bought Juices, Alcohol in Excess of 1-2 drinks a day, Artificial Sweeteners, Coffee Creamer, and "Sugar-free" Products. This will help patient to have stable blood glucose profile and potentially avoid unintended weight gain.   - I encouraged the patient to switch to unprocessed or minimally processed complex starch and increased protein intake (animal or plant source), fruits, and vegetables.   - Patient is advised to stick to a routine mealtimes to eat 3 meals a day and avoid unnecessary snacks (to snack only to correct hypoglycemia).  3. Hypervitaminosis D Her recent vitamin D level was high at 104.  She does take a MVI with 5000 units of Vitamin D 3 daily.  She is advised to take a break from this for now.  - I advised patient to maintain close follow up with Lucia Gaskins, MD for primary care needs.    I spent 20 minutes in the care of the patient today including review of labs from Thyroid Function, CMP, and other relevant labs ; imaging/biopsy records (current and previous including abstractions from other facilities); face-to-face time discussing  her lab results and symptoms, medications doses, her options of short and long term treatment based on the latest standards of care / guidelines;   and documenting the encounter.  Rona Ravens Fullbright  participated in the discussions, expressed understanding, and voiced agreement with the above plans.  All questions were answered to her satisfaction. she is encouraged to contact clinic should she have any questions or concerns prior to her return visit.   Follow up plan: Return in about 1 year (around 09/30/2021) for Thyroid follow up, Previsit labs.  Rayetta Pigg, [facility] [address] [place],  [place] [address] Phone: [phone] Fax: [phone]  09/30/2020, 10:38 AM

## 2020-09-30 NOTE — Patient Instructions (Signed)

## 2020-10-28 ENCOUNTER — Other Ambulatory Visit (HOSPITAL_COMMUNITY): Payer: Self-pay | Admitting: Nurse Practitioner

## 2020-10-28 DIAGNOSIS — Z1231 Encounter for screening mammogram for malignant neoplasm of breast: Secondary | ICD-10-CM

## 2020-12-06 ENCOUNTER — Other Ambulatory Visit: Payer: Self-pay

## 2020-12-06 ENCOUNTER — Ambulatory Visit (HOSPITAL_COMMUNITY)
Admission: RE | Admit: 2020-12-06 | Discharge: 2020-12-06 | Disposition: A | Discharge status: Home / Self Care | Payer: 59 | Source: Ambulatory Visit | Attending: Nurse Practitioner | Admitting: Nurse Practitioner

## 2020-12-06 DIAGNOSIS — Z1231 Encounter for screening mammogram for malignant neoplasm of breast: Secondary | ICD-10-CM

## 2020-12-17 ENCOUNTER — Other Ambulatory Visit: Payer: Self-pay

## 2020-12-17 ENCOUNTER — Encounter (HOSPITAL_BASED_OUTPATIENT_CLINIC_OR_DEPARTMENT_OTHER): Payer: Self-pay | Admitting: Ophthalmology

## 2020-12-24 ENCOUNTER — Encounter (HOSPITAL_BASED_OUTPATIENT_CLINIC_OR_DEPARTMENT_OTHER): Payer: Self-pay | Admitting: Ophthalmology

## 2020-12-24 NOTE — H&P (Signed)
[name] is an 60 y.o. female.   Chief Complaint:  Right  eye lid lesion  HPI: [name] c Right medial canthal eyelid lesion for excisional removal c local anesthesia and IV sedation .  Past Medical History:  Diagnosis Date   Arthritis    Hypothyroidism     Past Surgical History:  Procedure Laterality Date   APPENDECTOMY     Bilateral bunionectomy     COLONOSCOPY N/A 01/01/2015   Procedure: COLONOSCOPY;  Surgeon: Danie Binder, MD;  Location: [facility];  Service: Endoscopy;  Laterality: N/A;  11:15 AM - pt knows to arrive at 11:15   THYROIDECTOMY N/A 08/15/2013   Procedure: TOTAL THYROIDECTOMY;  Surgeon: Jamesetta So, MD;  Location: [facility];  Service: General;  Laterality: N/A;    History reviewed. No pertinent family history. Social History:  reports that she quit smoking about 8 years ago. Her smoking use included cigarettes. She has a 2.50 pack-year smoking history. She has never used smokeless tobacco. She reports that she does not drink alcohol and does not use drugs.  Allergies: No Known Allergies  No medications prior to admission.    No results found for this or any previous visit (from the past 48 hour(s)). No results found.  Review of Systems  Constitutional: Negative.   HENT: Negative.    Eyes:        Right   upper  Eyelid  lesion   Respiratory: Negative.    Cardiovascular: Negative.   Endocrine:        Hypothyroidism  Allergic/Immunologic: Negative.   Neurological: Negative.    Height 5\' 4"  (1.626 m), weight 75.3 kg, last menstrual period 06/24/2013. Physical Exam Vitals reviewed.  HENT:     Head: Normocephalic and atraumatic.  Eyes:     Pupils: Pupils are equal, round, and reactive to light.   Cardiovascular:     Rate and Rhythm: Normal rate.  Pulmonary:     Effort: Pulmonary effort is normal.     Breath sounds: Normal breath sounds.  Neurological:     General: No focal deficit present.     Mental Status: She is oriented to  person, place, and time.  Psychiatric:        Mood and Affect: Mood normal.        Behavior: Behavior normal.     Assessment/Plan  Right Eyelid lesion R/O epidermal inclusion cyst  :    Plan:  Excisional removal of  right eyelid lesion c local and IV sedation .  Gevena Cotton, MD 12/24/2020, 12:37 PM

## 2020-12-29 ENCOUNTER — Encounter (HOSPITAL_BASED_OUTPATIENT_CLINIC_OR_DEPARTMENT_OTHER)
Admission: RE | Disposition: A | Discharge status: Home / Self Care | Payer: Self-pay | Source: Home / Self Care | Attending: Ophthalmology

## 2020-12-29 ENCOUNTER — Ambulatory Visit (HOSPITAL_BASED_OUTPATIENT_CLINIC_OR_DEPARTMENT_OTHER)
Admission: RE | Admit: 2020-12-29 | Discharge: 2020-12-29 | Disposition: A | Discharge status: Home / Self Care | Payer: 59 | Attending: Ophthalmology | Admitting: Ophthalmology

## 2020-12-29 ENCOUNTER — Ambulatory Visit (HOSPITAL_BASED_OUTPATIENT_CLINIC_OR_DEPARTMENT_OTHER): Payer: 59 | Admitting: Certified Registered"

## 2020-12-29 ENCOUNTER — Other Ambulatory Visit: Payer: Self-pay

## 2020-12-29 ENCOUNTER — Encounter (HOSPITAL_BASED_OUTPATIENT_CLINIC_OR_DEPARTMENT_OTHER): Payer: Self-pay | Admitting: Ophthalmology

## 2020-12-29 DIAGNOSIS — D1801 Hemangioma of skin and subcutaneous tissue: Secondary | ICD-10-CM | POA: Diagnosis present

## 2020-12-29 DIAGNOSIS — M1990 Unspecified osteoarthritis, unspecified site: Secondary | ICD-10-CM | POA: Diagnosis not present

## 2020-12-29 DIAGNOSIS — E039 Hypothyroidism, unspecified: Secondary | ICD-10-CM | POA: Diagnosis not present

## 2020-12-29 DIAGNOSIS — Z87891 Personal history of nicotine dependence: Secondary | ICD-10-CM | POA: Diagnosis not present

## 2020-12-29 HISTORY — DX: Hypothyroidism, unspecified: E03.9

## 2020-12-29 HISTORY — PX: LID LESION EXCISION: SHX5204

## 2020-12-29 SURGERY — EXCISION, LESION, EYELID
Anesthesia: Monitor Anesthesia Care | Site: Eye | Laterality: Right

## 2020-12-29 MED ORDER — POVIDONE-IODINE 5 % OP SOLN
OPHTHALMIC | Status: AC
  Filled 2020-12-29: qty 90

## 2020-12-29 MED ORDER — FENTANYL CITRATE (PF) 100 MCG/2ML IJ SOLN
INTRAMUSCULAR | Status: DC | PRN
  Administered 2020-12-29: 50 ug via INTRAVENOUS

## 2020-12-29 MED ORDER — PROPOFOL 10 MG/ML IV BOLUS
INTRAVENOUS | Status: AC
  Filled 2020-12-29: qty 20

## 2020-12-29 MED ORDER — LIDOCAINE-EPINEPHRINE 2 %-1:100000 IJ SOLN
INTRAMUSCULAR | Status: DC | PRN
  Administered 2020-12-29: .5 mL via INTRADERMAL

## 2020-12-29 MED ORDER — TOBRADEX 0.3-0.1 % OP OINT: 1.0000 "application " | TOPICAL_OINTMENT | Freq: Two times a day (BID) | OPHTHALMIC | 0 refills | Status: DC

## 2020-12-29 MED ORDER — BSS IO SOLN
INTRAOCULAR | Status: AC
  Filled 2020-12-29: qty 45

## 2020-12-29 MED ORDER — BSS IO SOLN
INTRAOCULAR | Status: DC | PRN
  Administered 2020-12-29: 2 mL

## 2020-12-29 MED ORDER — ACETAMINOPHEN 10 MG/ML IV SOLN: 1000.0000 mg | Freq: Once | INTRAVENOUS | Status: DC | PRN

## 2020-12-29 MED ORDER — LACTATED RINGERS IV SOLN: INTRAVENOUS | Status: DC

## 2020-12-29 MED ORDER — TETRACAINE HCL 0.5 % OP SOLN
OPHTHALMIC | Status: AC
  Filled 2020-12-29: qty 4

## 2020-12-29 MED ORDER — FENTANYL CITRATE (PF) 100 MCG/2ML IJ SOLN
INTRAMUSCULAR | Status: AC
  Filled 2020-12-29: qty 2

## 2020-12-29 MED ORDER — LIDOCAINE-EPINEPHRINE 1 %-1:100000 IJ SOLN
INTRAMUSCULAR | Status: AC
  Filled 2020-12-29: qty 1

## 2020-12-29 MED ORDER — OXYCODONE HCL 5 MG PO TABS: 5.0000 mg | ORAL_TABLET | Freq: Once | ORAL | Status: DC | PRN

## 2020-12-29 MED ORDER — ONDANSETRON HCL 4 MG/2ML IJ SOLN
INTRAMUSCULAR | Status: DC | PRN
  Administered 2020-12-29: 4 mg via INTRAVENOUS

## 2020-12-29 MED ORDER — ACETAMINOPHEN 160 MG/5ML PO SOLN: 1000.0000 mg | Freq: Once | ORAL | Status: DC | PRN

## 2020-12-29 MED ORDER — TOBRAMYCIN-DEXAMETHASONE 0.3-0.1 % OP OINT
TOPICAL_OINTMENT | OPHTHALMIC | Status: DC | PRN
  Administered 2020-12-29: 1 via OPHTHALMIC

## 2020-12-29 MED ORDER — OXYCODONE HCL 5 MG/5ML PO SOLN: 5.0000 mg | Freq: Once | ORAL | Status: DC | PRN

## 2020-12-29 MED ORDER — PROPOFOL 500 MG/50ML IV EMUL
INTRAVENOUS | Status: AC
  Filled 2020-12-29: qty 50

## 2020-12-29 MED ORDER — FENTANYL CITRATE (PF) 100 MCG/2ML IJ SOLN: 25.0000 ug | INTRAMUSCULAR | Status: DC | PRN

## 2020-12-29 MED ORDER — POVIDONE-IODINE 5 % OP SOLN
OPHTHALMIC | Status: DC | PRN
  Administered 2020-12-29: 1 via OPHTHALMIC

## 2020-12-29 MED ORDER — MIDAZOLAM HCL 2 MG/2ML IJ SOLN
INTRAMUSCULAR | Status: DC | PRN
Start: 2020-12-29 — End: 2020-12-29
  Administered 2020-12-29: 2 mg via INTRAVENOUS

## 2020-12-29 MED ORDER — MIDAZOLAM HCL 2 MG/2ML IJ SOLN
INTRAMUSCULAR | Status: AC
  Filled 2020-12-29: qty 2

## 2020-12-29 MED ORDER — PROPOFOL 500 MG/50ML IV EMUL
INTRAVENOUS | Status: DC | PRN
Start: 2020-12-29 — End: 2020-12-29
  Administered 2020-12-29: 50 ug/kg/min via INTRAVENOUS

## 2020-12-29 MED ORDER — ACETAMINOPHEN 500 MG PO TABS: 1000.0000 mg | ORAL_TABLET | Freq: Once | ORAL | Status: DC | PRN

## 2020-12-29 MED ORDER — TOBRAMYCIN-DEXAMETHASONE 0.3-0.1 % OP OINT
TOPICAL_OINTMENT | OPHTHALMIC | Status: AC
  Filled 2020-12-29: qty 10.5

## 2020-12-29 MED ORDER — ATROPINE SULFATE 0.4 MG/ML IV SOLN
INTRAVENOUS | Status: AC
  Filled 2020-12-29: qty 1

## 2020-12-29 SURGICAL SUPPLY — 42 items
APL SRG 3 HI ABS STRL LF PLS (MISCELLANEOUS) ×1
APPLICATOR DR MATTHEWS STRL (MISCELLANEOUS) ×3 IMPLANT
BLADE SURG 11 STRL SS (BLADE) ×2 IMPLANT
BLADE SURG 15 STRL LF DISP TIS (BLADE) ×1 IMPLANT
BLADE SURG 15 STRL SS (BLADE) ×3
BNDG EYE OVAL (GAUZE/BANDAGES/DRESSINGS) IMPLANT
CAUTERY EYE LOW TEMP OLD (MISCELLANEOUS) IMPLANT
CLOSURE WOUND 1/2 X4 (GAUZE/BANDAGES/DRESSINGS) ×1
CORD BIPOLAR FORCEPS 12FT (ELECTRODE) ×2 IMPLANT
COVER BACK TABLE 60X90IN (DRAPES) ×3 IMPLANT
COVER MAYO STAND STRL (DRAPES) ×3 IMPLANT
DRAPE STRABISMUS 40X48 STRL (DRAPES) ×3 IMPLANT
DRAPE SURG 17X23 STRL (DRAPES) ×3 IMPLANT
ELECT REM PT RETURN 9FT ADLT (ELECTROSURGICAL)
ELECT REM PT RETURN 9FT PED (ELECTROSURGICAL)
ELECTRODE REM PT RETRN 9FT PED (ELECTROSURGICAL) IMPLANT
ELECTRODE REM PT RTRN 9FT ADLT (ELECTROSURGICAL) IMPLANT
GAUZE 4X4 16PLY ~~LOC~~+RFID DBL (SPONGE) ×2 IMPLANT
GLOVE SURG POLYISO LF SZ6.5 (GLOVE) ×2 IMPLANT
GLOVE SURG PR MICRO ENCORE 7.5 (GLOVE) ×3 IMPLANT
GLOVE SURG UNDER POLY LF SZ6.5 (GLOVE) ×2 IMPLANT
GOWN STRL REUS W/ TWL LRG LVL3 (GOWN DISPOSABLE) ×2 IMPLANT
GOWN STRL REUS W/TWL LRG LVL3 (GOWN DISPOSABLE) ×6
NDL HYPO 30X.5 LL (NEEDLE) ×1 IMPLANT
NEEDLE HYPO 30X.5 LL (NEEDLE) ×3 IMPLANT
PACK BASIN DAY SURGERY FS (CUSTOM PROCEDURE TRAY) ×3 IMPLANT
PENCIL SMOKE EVACUATOR (MISCELLANEOUS) IMPLANT
SHEET MEDIUM DRAPE 40X70 STRL (DRAPES) ×3 IMPLANT
SHEILD EYE MED CORNL SHD 22X21 (OPHTHALMIC RELATED)
SHIELD EYE MED CORNL SHD 22X21 (OPHTHALMIC RELATED) IMPLANT
STRIP CLOSURE SKIN 1/2X4 (GAUZE/BANDAGES/DRESSINGS) ×2 IMPLANT
SUT ETHILON 6 0 P 1 (SUTURE) IMPLANT
SUT PLAIN 6 0 TG1408 (SUTURE) IMPLANT
SUT SILK 4 0 P 3 (SUTURE) IMPLANT
SUT VIC AB 4-0 P-3 18XBRD (SUTURE) IMPLANT
SUT VIC AB 4-0 P3 18 (SUTURE)
SUT VICRYL 6 0 S 28 (SUTURE) IMPLANT
SUT VICRYL 6 0 S 29 12 (SUTURE) IMPLANT
SUT VICRYL 8 0 TG140 8 (SUTURE) ×4 IMPLANT
SYR 3ML 23GX1 SAFETY (SYRINGE) ×3 IMPLANT
TOWEL GREEN STERILE FF (TOWEL DISPOSABLE) ×6 IMPLANT
TRAY DSU PREP LF (CUSTOM PROCEDURE TRAY) ×3 IMPLANT

## 2020-12-29 NOTE — Interval H&P Note (Signed)
History and Physical Interval Note:  12/29/2020 7:49 AM  [name]  has presented today for surgery, with the diagnosis of RIGHT EYELID LESION.  The various methods of treatment have been discussed with the patient and family. After consideration of risks, benefits and other options for treatment, the patient has consented to  Procedure(s) with comments: LID LESION EXCISION (Right) - LOCAL as a surgical intervention.  The patient's history has been reviewed, patient examined, no change in status, stable for surgery.  I have reviewed the patient's chart and labs.  Questions were answered to the patient's satisfaction.     Gevena Cotton

## 2020-12-29 NOTE — Brief Op Note (Signed)
12/29/2020  8:43 AM  PATIENT:  [name]  60 y.o. female  PRE-OPERATIVE DIAGNOSIS:  RIGHT EYELID LESION  POST-OPERATIVE DIAGNOSIS:  RIGHT EYELID LESION  PROCEDURE:  Procedure(s) with comments: LID LESION EXCISION (Right) - LOCAL  SURGEON:  Surgeon(s) and Role:    Gevena Cotton, MD - Primary  PHYSICIAN ASSISTANT:   ASSISTANTS: none   ANESTHESIA:   local and IV sedation  EBL:  1 mL   BLOOD ADMINISTERED:none  DRAINS: none   LOCAL MEDICATIONS USED:  LIDOCAINE   SPECIMEN:  Excision  DISPOSITION OF SPECIMEN:  PATHOLOGY  COUNTS:  YES  TOURNIQUET:  * No tourniquets in log *  DICTATION: .Other Dictation: Dictation Number [id number]  PLAN OF CARE: Discharge to home after PACU  PATIENT DISPOSITION:  PACU - hemodynamically stable.   Delay start of Pharmacological VTE agent (>24hrs) due to surgical blood loss or risk of bleeding: not applicable

## 2020-12-29 NOTE — Interval H&P Note (Signed)
History and Physical Interval Note:  12/29/2020 7:50 AM  Junelle L Robillard  has presented today for surgery, with the diagnosis of RIGHT EYELID LESION.  The various methods of treatment have been discussed with the patient and family. After consideration of risks, benefits and other options for treatment, the patient has consented to  Procedure(s) with comments: LID LESION EXCISION (Right) - LOCAL as a surgical intervention.  The patient's history has been reviewed, patient examined, no change in status, stable for surgery.  I have reviewed the patient's chart and labs.  Questions were answered to the patient's satisfaction.     Gevena Cotton

## 2020-12-29 NOTE — Op Note (Signed)
NAME: JUNE, RODE MEDICAL RECORD NO: [id number] ACCOUNT NO: [id number] DATE OF BIRTH: 02/24/1960 FACILITY: [facility] LOCATION: [facility] PHYSICIAN: Venia Carbon. Frederico Hamman, MD  Operative Report   DATE OF PROCEDURE: 12/29/2020  PREOPERATIVE DIAGNOSIS:  Right upper lid cystic lesion.  PROCEDURE: Excisional removal of right upper lid cystic lesion.  SURGEON: Venia Carbon. Frederico Hamman, MD  ANESTHESIA:  Local with IV sedation.  POSTOPERATIVE DIAGNOSIS:  Status post excision of right upper eyelid lesion.  INDICATIONS FOR PROCEDURE:  The patient is a 60 year old female with chronic right upper eyelid medial canthal lesion.  This procedure was indicated to remove the offending lesion and restore normal eyelid anatomy and function.  The risks and benefits of  the procedure explained to the patient prior to procedure and informed consent was obtained.  DESCRIPTION OF TECHNIQUE:  The patient was taken to the operating room and placed in the supine position, given an IV sedative and the lesion was identified in the right upper lid, the entire face was then prepped and draped in the usual sterile fashion.   Local injection of lidocaine 2% with epinephrine, approximately 0.5 mL was given at the lesion site demarcating the lesion.  The skin was then incised with sharp dissection.  Hemostasis was achieved with bipolar cautery.  The lesion itself was then  excised sharply undermining its attachments.  It did have a granular appearance to it and it was then excised completely and sent for pathology.  The skin edges were then sharpened, undermined.A 3.0 probe was passed through the canaliculus into the nasolacrimal sac to ensure that the lesion was superior to the canaliculus.  Next, hemostasis was achieved with light bipolar cautery and the lesion defect was then closed in 2 layers with interrupted 8-0 Vicryl sutures.   The orbicularis was closed first with buried sutures.  The skin was then closed with interrupted 8-0  Vicryl suture.  At the conclusion of the procedure, TobraDex ointment was applied over the lesion site.  There were no apparent complications.   PUS D: 12/29/2020 8:49:12 am T: 12/29/2020 9:51:00 am  JOB: [id number]/ [id number]

## 2020-12-29 NOTE — Discharge Instructions (Signed)

## 2020-12-29 NOTE — Anesthesia Preprocedure Evaluation (Signed)
Anesthesia Evaluation  Patient identified by MRN, date of birth, ID band Patient awake    Reviewed: Allergy & Precautions, NPO status , Patient's Chart, lab work & pertinent test results  History of Anesthesia Complications Negative for: history of anesthetic complications  Airway Mallampati: I  TM Distance: >3 FB     Dental  (+) Dental Advisory Given, Teeth Intact   Pulmonary neg shortness of breath, neg sleep apnea, neg COPD, neg recent URI, former smoker,    breath sounds clear to auscultation       Cardiovascular negative cardio ROS   Rhythm:Regular     Neuro/Psych negative neurological ROS  negative psych ROS   GI/Hepatic negative GI ROS, Neg liver ROS,   Endo/Other  Hypothyroidism   Renal/GU      Musculoskeletal  (+) Arthritis ,   Abdominal   Peds  Hematology negative hematology ROS (+)   Anesthesia Other Findings   Reproductive/Obstetrics                             Anesthesia Physical Anesthesia Plan  ASA: 2  Anesthesia Plan: MAC   Post-op Pain Management: Minimal or no pain anticipated   Induction: Intravenous  PONV Risk Score and Plan: 2 and Treatment may vary due to age or medical condition and Propofol infusion  Airway Management Planned: Nasal Cannula  Additional Equipment: None  Intra-op Plan:   Post-operative Plan:   Informed Consent: I have reviewed the patients History and Physical, chart, labs and discussed the procedure including the risks, benefits and alternatives for the proposed anesthesia with the patient or authorized representative who has indicated his/her understanding and acceptance.     Dental advisory given  Plan Discussed with: CRNA and Anesthesiologist  Anesthesia Plan Comments:         Anesthesia Quick Evaluation

## 2020-12-29 NOTE — Transfer of Care (Signed)
Immediate Anesthesia Transfer of Care Note  Patient: [name]  Procedure(s) Performed: LID LESION EXCISION (Right: Eye)  Patient Location: PACU  Anesthesia Type:MAC  Level of Consciousness: awake, alert  and oriented  Airway & Oxygen Therapy: Patient Spontanous Breathing and Patient connected to nasal cannula oxygen  Post-op Assessment: Report given to RN and Post -op Vital signs reviewed and stable  Post vital signs: Reviewed and stable  Last Vitals:  Vitals Value Taken Time  BP 114/91 12/29/20 0840  Temp    Pulse 76 12/29/20 0842  Resp 20 12/29/20 0842  SpO2 97 % 12/29/20 0842  Vitals shown include unvalidated device data.  Last Pain:  Vitals:   12/29/20 0647  TempSrc: Oral  PainSc: 0-No pain         Complications: No notable events documented.

## 2020-12-30 ENCOUNTER — Encounter (HOSPITAL_BASED_OUTPATIENT_CLINIC_OR_DEPARTMENT_OTHER): Payer: Self-pay | Admitting: Ophthalmology

## 2020-12-30 LAB — SURGICAL PATHOLOGY

## 2020-12-30 NOTE — Anesthesia Postprocedure Evaluation (Signed)
Anesthesia Post Note  Patient: [name]  Procedure(s) Performed: LID LESION EXCISION (Right: Eye)     Patient location during evaluation: PACU Anesthesia Type: MAC Level of consciousness: awake and alert Pain management: pain level controlled Vital Signs Assessment: post-procedure vital signs reviewed and stable Respiratory status: spontaneous breathing, nonlabored ventilation, respiratory function stable and patient connected to nasal cannula oxygen Cardiovascular status: stable and blood pressure returned to baseline Postop Assessment: no apparent nausea or vomiting Anesthetic complications: no   No notable events documented.  Last Vitals:  Vitals:   12/29/20 0900 12/29/20 0920  BP: 99/60 107/75  Pulse: 68 72  Resp: 15 16  Temp:  36.9 C  SpO2: 94% 95%    Last Pain:  Vitals:   12/30/20 0936  TempSrc:   PainSc: 2                  [name]

## 2021-09-26 ENCOUNTER — Other Ambulatory Visit: Payer: Self-pay | Admitting: Nurse Practitioner

## 2021-09-26 DIAGNOSIS — R7303 Prediabetes: Secondary | ICD-10-CM

## 2021-09-26 DIAGNOSIS — E890 Postprocedural hypothyroidism: Secondary | ICD-10-CM

## 2021-09-26 DIAGNOSIS — E673 Hypervitaminosis D: Secondary | ICD-10-CM

## 2021-09-27 DIAGNOSIS — E890 Postprocedural hypothyroidism: Secondary | ICD-10-CM | POA: Diagnosis not present

## 2021-09-27 DIAGNOSIS — E673 Hypervitaminosis D: Secondary | ICD-10-CM | POA: Diagnosis not present

## 2021-09-27 DIAGNOSIS — R7303 Prediabetes: Secondary | ICD-10-CM | POA: Diagnosis not present

## 2021-09-28 LAB — TSH: TSH: 1.93 u[IU]/mL (ref 0.450–4.500)

## 2021-09-28 LAB — HEMOGLOBIN A1C
Est. average glucose Bld gHb Est-mCnc: 126 mg/dL
Hgb A1c MFr Bld: 6 % — ABNORMAL HIGH (ref 4.8–5.6)

## 2021-09-28 LAB — T4, FREE: Free T4: 1.58 ng/dL (ref 0.82–1.77)

## 2021-09-28 LAB — VITAMIN D 25 HYDROXY (VIT D DEFICIENCY, FRACTURES): Vit D, 25-Hydroxy: 71.2 ng/mL (ref 30.0–100.0)

## 2021-10-03 ENCOUNTER — Ambulatory Visit: Payer: 59 | Admitting: Nurse Practitioner

## 2021-10-03 ENCOUNTER — Encounter: Payer: Self-pay | Admitting: Nurse Practitioner

## 2021-10-03 VITALS — BP 105/68 | HR 76 | Ht 64.5 in | Wt 164.4 lb

## 2021-10-03 DIAGNOSIS — E673 Hypervitaminosis D: Secondary | ICD-10-CM

## 2021-10-03 DIAGNOSIS — R7303 Prediabetes: Secondary | ICD-10-CM

## 2021-10-03 DIAGNOSIS — E890 Postprocedural hypothyroidism: Secondary | ICD-10-CM

## 2021-10-03 MED ORDER — LEVOTHYROXINE SODIUM 88 MCG PO TABS: 88.0000 ug | ORAL_TABLET | Freq: Every day | ORAL | 3 refills | Status: DC

## 2021-10-03 NOTE — Patient Instructions (Signed)

## 2021-10-03 NOTE — Progress Notes (Signed)
10/03/2021   Endocrinology follow-up note   Subjective:    Patient ID: [name], female    DOB: [birth year], PCP Hyler, Gwen Her, NP (Inactive)   Past Medical History:  Diagnosis Date   Arthritis    Hypothyroidism    Past Surgical History:  Procedure Laterality Date   APPENDECTOMY     Bilateral bunionectomy     COLONOSCOPY N/A 01/01/2015   Procedure: COLONOSCOPY;  Surgeon: Danie Binder, MD;  [facility];  Service: Endoscopy;  Laterality: N/A;  11:15 AM - pt knows to arrive at 11:15   LID LESION EXCISION Right 12/29/2020   Procedure: LID LESION EXCISION;  Surgeon: Gevena Cotton, MD;  Location: [facility];  Service: Ophthalmology;  Laterality: Right;  LOCAL   THYROIDECTOMY N/A 08/15/2013   Procedure: TOTAL THYROIDECTOMY;  Surgeon: Jamesetta So, MD;  Location: [facility];  Service: General;  Laterality: N/A;   Social History   Socioeconomic History   Marital status: Married    Spouse name: Not on file   Number of children: Not on file   Years of education: Not on file   Highest education level: Not on file  Occupational History   Not on file  Tobacco Use   Smoking status: Former    Packs/day: 0.25    Years: 10.00    Total pack years: 2.50    Types: Cigarettes    Quit date: 08/11/2012    Years since quitting: 9.1   Smokeless tobacco: Never  Vaping Use   Vaping Use: Never used  Substance and Sexual Activity   Alcohol use: No   Drug use: No   Sexual activity: Yes    Birth control/protection: None  Other Topics Concern   Not on file  Social History Narrative   Not on file   Social Determinants of Health   Financial Resource Strain: Low Risk  (05/06/2020)   Overall Financial Resource Strain (CARDIA)    Difficulty of Paying Living Expenses: Not hard at all  Food Insecurity: No Food Insecurity (05/06/2020)   Hunger Vital Sign    Worried About Running Out of Food in the Last Year: Never true    [place] in the Last Year: Never  true  Transportation Needs: No Transportation Needs (05/06/2020)   PRAPARE - Hydrologist (Medical): No    Lack of Transportation (Non-Medical): No  Physical Activity: Sufficiently Active (05/06/2020)   Exercise Vital Sign    Days of Exercise per Week: 5 days    Minutes of Exercise per Session: 30 min  Stress: No Stress Concern Present (05/06/2020)   [place]    Feeling of Stress : Not at all  Social Connections: Moderately Isolated (05/06/2020)   Social Connection and Isolation Panel [NHANES]    Frequency of Communication with Friends and Family: More than three times a week    Frequency of Social Gatherings with Friends and Family: More than three times a week    Attends Religious Services: Never    Marine scientist or Organizations: No    Attends Archivist Meetings: Never    Marital Status: Living with partner   Outpatient Encounter Medications as of 10/03/2021  Medication Sig   naproxen (NAPROSYN) 500 MG tablet Take 500 mg by mouth 2 (two) times daily.   rosuvastatin (CRESTOR) 20 MG tablet Take 20 mg by mouth daily.   [DISCONTINUED] levothyroxine (  SYNTHROID) 88 MCG tablet Take 1 tablet (88 mcg total) by mouth daily before breakfast.   levothyroxine (SYNTHROID) 88 MCG tablet Take 1 tablet (88 mcg total) by mouth daily before breakfast.   [DISCONTINUED] tobramycin-dexamethasone (TOBRADEX) ophthalmic ointment Place 1 application into the right eye 2 (two) times daily at 10 am and 4 pm.   No facility-administered encounter medications on file as of 10/03/2021.   ALLERGIES: No Known Allergies VACCINATION STATUS: Immunization History  Administered Date(s) Administered   Tdap 09/24/2019    Thyroid Problem Presents for follow-up visit. Patient reports no anxiety, cold intolerance, constipation, depressed mood, diarrhea, dry skin, fatigue, hair loss, heat intolerance, leg  swelling, palpitations, tremors, weight gain or weight loss. The symptoms have been stable.     61 year old female  with medical history significant for multinodular goiter status post prior radioactive iodine ablation, and completion thyroidectomy on 08/15/13 with benign outcomes. She is currently on Levothyroxine 88 mcg p.o. daily before breakfast for postsurgical hypothyroidism.  She is compliant to her medications.  She has been stable on this medication for quite some time now.  She has no new complaints today.   -She denies cold/hot intolerance. -She has a steady energy level.  She has family hx of thyroid nodules, but no family hx of thyroid cancer.  She was diagnosed with prediabetes on last visit with A1C of 5.8%.  Review of systems  Constitutional: + Minimally fluctuating body weight,  current Body mass index is 27.78 kg/m. , no fatigue, no subjective hyperthermia, no subjective hypothermia Eyes: no blurry vision, no xerophthalmia ENT: no sore throat, no nodules palpated in throat, no dysphagia/odynophagia, no hoarseness Cardiovascular: no chest pain, no shortness of breath, no palpitations, no leg swelling Respiratory: no cough, no shortness of breath Gastrointestinal: no nausea/vomiting/diarrhea Musculoskeletal: no muscle/joint aches Skin: no rashes, no hyperemia Neurological: no tremors, no numbness, no tingling, no dizziness Psychiatric: no depression, no anxiety  Objective:    BP 105/68 (BP Location: Right Arm, Patient Position: Sitting, Cuff Size: Normal)   Pulse 76   Ht 5' 4.5" (1.638 m)   Wt 164 lb 6.4 oz (74.6 kg)   LMP 01/11/2013   BMI 27.78 kg/m   Wt Readings from Last 3 Encounters:  10/03/21 164 lb 6.4 oz (74.6 kg)  12/29/20 164 lb 14.5 oz (74.8 kg)  09/30/20 166 lb (75.3 kg)    BP Readings from Last 3 Encounters:  10/03/21 105/68  12/29/20 107/75  09/30/20 106/68    Physical Exam- Limited  Constitutional:  There is no height or weight on file to  calculate BMI. , not in acute distress, normal state of mind Eyes:  EOMI, no exophthalmos Neck: Supple Thyroid: post thyroidectomy scar Cardiovascular: RRR, no murmurs, rubs, or gallops, no edema Respiratory: Adequate breathing efforts, no crackles, rales, rhonchi, or wheezing Musculoskeletal: no gross deformities, strength intact in all four extremities, no gross restriction of joint movements Skin:  no rashes, no hyperemia Neurological: no tremor with outstretched hands     CMP     Component Value Date/Time   NA 140 09/27/2020 1037   K 4.7 09/27/2020 1037   CL 104 09/27/2020 1037   CO2 23 09/27/2020 1037   GLUCOSE 95 09/27/2020 1037   GLUCOSE 94 08/16/2013 0547   BUN 15 09/27/2020 1037   CREATININE 0.64 09/27/2020 1037   CALCIUM 9.7 09/27/2020 1037   PROT 6.7 09/27/2020 1037   ALBUMIN 4.6 09/27/2020 1037   AST 17 09/27/2020 1037   ALT 24 09/27/2020  5732   KGURKYH 06 09/27/2020 1037   BILITOT 0.3 09/27/2020 1037   GFRNONAA 83 09/23/2019 0000   GFRAA >90 08/16/2013 0547   Recent Results (from the past 2160 hour(s))  TSH     Status: None   Collection Time: 09/27/21 10:06 AM  Result Value Ref Range   TSH 1.930 0.450 - 4.500 uIU/mL  T4, free     Status: None   Collection Time: 09/27/21 10:06 AM  Result Value Ref Range   Free T4 1.58 0.82 - 1.77 ng/dL  VITAMIN D 25 Hydroxy (Vit-D Deficiency, Fractures)     Status: None   Collection Time: 09/27/21 10:06 AM  Result Value Ref Range   Vit D, 25-Hydroxy 71.2 30.0 - 100.0 ng/mL    Comment: Vitamin D deficiency has been defined by the [place] practice guideline as a level of serum 25-OH vitamin D less than 20 ng/mL (1,2). The Endocrine Society went on to further define vitamin D insufficiency as a level between 21 and 29 ng/mL (2). 1. IOM (Institute of Medicine). 2010. Dietary reference    intakes for calcium and D. [place]: The    [employer]. 2. Holick MF, [place]ink[place]  [place], Bischoff-Ferrari HA, et al.    Evaluation, treatment, and prevention of vitamin D    deficiency: an Endocrine Society clinical practice    guideline. JCEM. 2011 Jul; 96(7):1911-30.   Hemoglobin A1c     Status: Abnormal   Collection Time: 09/27/21 10:06 AM  Result Value Ref Range   Hgb A1c MFr Bld 6.0 (H) 4.8 - 5.6 %    Comment:          Prediabetes: 5.7 - 6.4          Diabetes: >6.4          Glycemic control for adults with diabetes: <7.0    Est. average glucose Bld gHb Est-mCnc 126 mg/dL    Latest Reference Range & Units 09/27/20 10:37 09/27/21 10:06  TSH 0.450 - 4.500 uIU/mL 1.040 1.930  T4,Free(Direct) 0.82 - 1.77 ng/dL 1.79 (H) 1.58  (H): Data is abnormally high  Assessment & Plan:   1. Postsurgical hypothyroidism -s/p total thyroidectomy Her previsit thyroid function tests are consistent with appropriate replacement.  She is advised to continue Levothyroxine 88 mcg p.o. daily before breakfast. Will recheck TFTs prior to next visit in 1 year.    - We discussed about the correct intake of her thyroid hormone, on empty stomach at fasting, with water, separated by at least 30 minutes from breakfast and other medications,  and separated by more than 4 hours from calcium, iron, multivitamins, acid reflux medications (PPIs). -Patient is made aware of the fact that thyroid hormone replacement is needed for life, dose to be adjusted by periodic monitoring of thyroid function tests.  2. Prediabetes Her previsit A1C is 6%, same as last year.  Still in the prediabetes category.  She does not need medication at this time.  She is encouraged to continue to work on lifestyle modifications.  - Nutritional counseling repeated at each appointment due to patients tendency to fall back in to old habits.  - The patient admits there is a room for improvement in their diet and drink choices. -  Suggestion is made for the patient to avoid simple carbohydrates from their diet including Cakes, Sweet  Desserts / Pastries, Ice Cream, Soda (diet and regular), Sweet Tea, Candies, Chips, Cookies, Sweet Pastries, Store Bought Juices, Alcohol  in Excess of 1-2 drinks a day, Artificial Sweeteners, Coffee Creamer, and "Sugar-free" Products. This will help patient to have stable blood glucose profile and potentially avoid unintended weight gain.   - I encouraged the patient to switch to unprocessed or minimally processed complex starch and increased protein intake (animal or plant source), fruits, and vegetables.   - Patient is advised to stick to a routine mealtimes to eat 3 meals a day and avoid unnecessary snacks (to snack only to correct hypoglycemia).  3. Hypervitaminosis D Her repeat vitamin D level on 09/27/21 was normal at 71.2. She is taking MVI with Vitamin D in it, advised to continue.   - I advised patient to maintain close follow up with Hyler, Gwen Her, NP (Inactive) for primary care needs.      I spent 25 minutes in the care of the patient today including review of labs from [facility], Lipids, Thyroid Function, Hematology (current and previous including abstractions from other facilities); face-to-face time discussing  her blood glucose readings/logs, discussing hypoglycemia and hyperglycemia episodes and symptoms, medications doses, her options of short and long term treatment based on the latest standards of care / guidelines;  discussion about incorporating lifestyle medicine;  and documenting the encounter. Risk reduction counseling performed per USPSTF guidelines to reduce obesity and cardiovascular risk factors.     Please refer to Patient Instructions for Blood Glucose Monitoring and Insulin/Medications Dosing Guide"  in media tab for additional information. Please  also refer to " Patient Self Inventory" in the Media  tab for reviewed elements of pertinent patient history.  [name] participated in the discussions, expressed understanding, and voiced agreement with the above plans.   All questions were answered to her satisfaction. she is encouraged to contact clinic should she have any questions or concerns prior to her return visit.   Follow up plan: Return in about 1 year (around 10/04/2022) for Thyroid follow up, Previsit labs, pre DM follow up.  Rayetta Pigg, [facility] [address] [place], [place] [address] Phone: [phone] Fax: [phone]  10/03/2021, 10:41 AM

## 2021-10-04 ENCOUNTER — Other Ambulatory Visit (HOSPITAL_COMMUNITY): Payer: Self-pay | Admitting: Internal Medicine

## 2021-10-04 DIAGNOSIS — Z1231 Encounter for screening mammogram for malignant neoplasm of breast: Secondary | ICD-10-CM

## 2021-10-11 ENCOUNTER — Ambulatory Visit (INDEPENDENT_AMBULATORY_CARE_PROVIDER_SITE_OTHER): Payer: 59 | Admitting: Internal Medicine

## 2021-10-11 ENCOUNTER — Encounter: Payer: Self-pay | Admitting: Internal Medicine

## 2021-10-11 VITALS — BP 115/76 | HR 89 | Ht 64.5 in | Wt 166.2 lb

## 2021-10-11 DIAGNOSIS — Z1159 Encounter for screening for other viral diseases: Secondary | ICD-10-CM | POA: Diagnosis not present

## 2021-10-11 DIAGNOSIS — Z114 Encounter for screening for human immunodeficiency virus [HIV]: Secondary | ICD-10-CM | POA: Diagnosis not present

## 2021-10-11 DIAGNOSIS — Z23 Encounter for immunization: Secondary | ICD-10-CM

## 2021-10-11 DIAGNOSIS — E890 Postprocedural hypothyroidism: Secondary | ICD-10-CM

## 2021-10-11 DIAGNOSIS — Z720 Tobacco use: Secondary | ICD-10-CM

## 2021-10-11 DIAGNOSIS — E782 Mixed hyperlipidemia: Secondary | ICD-10-CM | POA: Diagnosis not present

## 2021-10-11 DIAGNOSIS — Z7689 Persons encountering health services in other specified circumstances: Secondary | ICD-10-CM | POA: Insufficient documentation

## 2021-10-11 DIAGNOSIS — R7303 Prediabetes: Secondary | ICD-10-CM | POA: Diagnosis not present

## 2021-10-11 DIAGNOSIS — R69 Illness, unspecified: Secondary | ICD-10-CM | POA: Diagnosis not present

## 2021-10-11 NOTE — Assessment & Plan Note (Signed)
Followed by endocrinology (Dr. Dorris Fetch).  Last seen 10/2.  She is currently prescribed Synthroid 88 mcg daily.

## 2021-10-11 NOTE — Assessment & Plan Note (Signed)
Presenting today to establish care.  Recent medical records and lab work reviewed. -Additional labs ordered today, including one-time HIV/HCV screening -Influenza vaccine administered today -She is scheduled for mammogram in December

## 2021-10-11 NOTE — Assessment & Plan Note (Signed)
Currently smoking cigarettes.  She states that 1 pack will last 3-4 days.  She has been smoking at this rate since age 61.  She is aware of the need to quit and is making strides to do so.  I recommended choosing a quit date and finding alternatives to smoking when she experiences a craving. -The patient was counseled on the dangers of tobacco use, and was advised to quit.  Reviewed strategies to maximize success, including removing cigarettes and smoking materials from environment, stress management, substitution of other forms of reinforcement, support of family/friends and written materials.

## 2021-10-11 NOTE — Assessment & Plan Note (Signed)
HbA1c 6.0 last month.  Followed by endocrinology.  We reviewed appropriate lifestyle modifications today.

## 2021-10-11 NOTE — Assessment & Plan Note (Signed)
She is currently prescribed rosuvastatin 20 mg daily.  Repeat lipid panel ordered today.

## 2021-10-11 NOTE — Progress Notes (Signed)
New Patient Office Visit  Subjective    Patient ID: [name], female    DOB: [birth year]  Age: 61 y.o. MRN: [id number]  CC:  Chief Complaint  Patient presents with   Establish Care   HPI [name] presents to establish care.  She is a 61 year old woman with a past medical history significant for acquired hypothyroidism, hyperlipidemia, tobacco abuse, and prediabetes.  She was previously followed at the [facility] clinic.  She has no acute concerns today aside from wanting to establish care.  [name] is asymptomatic currently.  Chronic medical conditions and outstanding preventative healthcare maintenance items discussed today are individually addressed in A/P below.  Outpatient Encounter Medications as of 10/11/2021  Medication Sig   levothyroxine (SYNTHROID) 88 MCG tablet Take 1 tablet (88 mcg total) by mouth daily before breakfast.   Multiple Vitamin (MULTIVITAMIN) tablet Take 1 tablet by mouth daily.   naproxen (NAPROSYN) 500 MG tablet Take 500 mg by mouth 2 (two) times daily.   rosuvastatin (CRESTOR) 20 MG tablet Take 20 mg by mouth daily.   No facility-administered encounter medications on file as of 10/11/2021.   Past Medical History:  Diagnosis Date   Arthritis    Hypothyroidism     Past Surgical History:  Procedure Laterality Date   APPENDECTOMY     Bilateral bunionectomy     COLONOSCOPY N/A 01/01/2015   Procedure: COLONOSCOPY;  Surgeon: Danie Binder, MD;  [facility];  Service: Endoscopy;  Laterality: N/A;  11:15 AM - pt knows to arrive at 11:15   LID LESION EXCISION Right 12/29/2020   Procedure: LID LESION EXCISION;  Surgeon: Gevena Cotton, MD;  Location: [facility];  Service: Ophthalmology;  Laterality: Right;  LOCAL   THYROIDECTOMY N/A 08/15/2013   Procedure: TOTAL THYROIDECTOMY;  Surgeon: Jamesetta So, MD;  Location: [facility];  Service: General;  Laterality: N/A;   History reviewed. No pertinent family history.  Social  History   Socioeconomic History   Marital status: Married    Spouse name: Not on file   Number of children: Not on file   Years of education: Not on file   Highest education level: Not on file  Occupational History   Not on file  Tobacco Use   Smoking status: Former    Packs/day: 0.25    Years: 10.00    Total pack years: 2.50    Types: Cigarettes    Quit date: 08/11/2012    Years since quitting: 9.1   Smokeless tobacco: Never  Vaping Use   Vaping Use: Never used  Substance and Sexual Activity   Alcohol use: No   Drug use: No   Sexual activity: Yes    Birth control/protection: None  Other Topics Concern   Not on file  Social History Narrative   Not on file   Social Determinants of Health   Financial Resource Strain: Low Risk  (05/06/2020)   Overall Financial Resource Strain (CARDIA)    Difficulty of Paying Living Expenses: Not hard at all  Food Insecurity: No Food Insecurity (05/06/2020)   Hunger Vital Sign    Worried About Running Out of Food in the Last Year: Never true    [place] in the Last Year: Never true  Transportation Needs: No Transportation Needs (05/06/2020)   PRAPARE - Hydrologist (Medical): No    Lack of Transportation (Non-Medical): No  Physical Activity: Sufficiently Active (05/06/2020)   Exercise  Vital Sign    Days of Exercise per Week: 5 days    Minutes of Exercise per Session: 30 min  Stress: No Stress Concern Present (05/06/2020)   [place]    Feeling of Stress : Not at all  Social Connections: Moderately Isolated (05/06/2020)   Social Connection and Isolation Panel [NHANES]    Frequency of Communication with Friends and Family: More than three times a week    Frequency of Social Gatherings with Friends and Family: More than three times a week    Attends Religious Services: Never    Marine scientist or Organizations: No    Attends Theatre manager Meetings: Never    Marital Status: Living with partner  Intimate Partner Violence: Not At Risk (05/06/2020)   Humiliation, Afraid, Rape, and Kick questionnaire    Fear of Current or Ex-Partner: No    Emotionally Abused: No    Physically Abused: No    Sexually Abused: No   Review of Systems  Constitutional:  Negative for chills and fever.  HENT:  Negative for sore throat.   Respiratory:  Negative for cough and shortness of breath.   Cardiovascular:  Negative for chest pain, palpitations and leg swelling.  Gastrointestinal:  Negative for abdominal pain, blood in stool, constipation, diarrhea, nausea and vomiting.  Genitourinary:  Negative for dysuria and hematuria.  Musculoskeletal:  Negative for myalgias.  Skin:  Negative for itching and rash.  Neurological:  Negative for dizziness and headaches.  Psychiatric/Behavioral:  Negative for depression and suicidal ideas.    Objective    BP 115/76   Pulse 89   Ht 5' 4.5" (1.638 m)   Wt 166 lb 3.2 oz (75.4 kg)   LMP 01/11/2013   SpO2 94%   BMI 28.09 kg/m   Physical Exam Constitutional:      General: She is not in acute distress.    Appearance: Normal appearance. She is not toxic-appearing.  HENT:     Head: Normocephalic and atraumatic.     Right Ear: External ear normal.     Left Ear: External ear normal.     Nose: Nose normal. No congestion or rhinorrhea.     Mouth/Throat:     Mouth: Mucous membranes are moist.     Pharynx: Oropharynx is clear. No oropharyngeal exudate or posterior oropharyngeal erythema.  Eyes:     General: No scleral icterus.    Extraocular Movements: Extraocular movements intact.     Conjunctiva/sclera: Conjunctivae normal.     Pupils: Pupils are equal, round, and reactive to light.  Neck:     Comments: Well-healed surgical scar from thyroidectomy on anterior neck Cardiovascular:     Rate and Rhythm: Normal rate and regular rhythm.     Pulses: Normal pulses.     Heart sounds: Normal heart  sounds. No murmur heard.    No friction rub. No gallop.  Pulmonary:     Effort: Pulmonary effort is normal.     Breath sounds: Normal breath sounds. No wheezing, rhonchi or rales.  Abdominal:     General: Abdomen is flat. Bowel sounds are normal. There is no distension.     Palpations: Abdomen is soft.     Tenderness: There is no abdominal tenderness.  Musculoskeletal:        General: Normal range of motion.     Cervical back: Normal range of motion. No tenderness.     Right lower leg: No edema.  Left lower leg: No edema.  Lymphadenopathy:     Cervical: No cervical adenopathy.  Skin:    General: Skin is warm and dry.     Capillary Refill: Capillary refill takes less than 2 seconds.     Coloration: Skin is not jaundiced.  Neurological:     General: No focal deficit present.     Mental Status: She is alert and oriented to person, place, and time.     Motor: No weakness.  Psychiatric:        Mood and Affect: Mood normal.        Behavior: Behavior normal.    Last CBC Lab Results  Component Value Date   WBC 11.3 (H) 08/16/2013   HGB 11.7 (L) 08/16/2013   HCT 35.0 (L) 08/16/2013   MCV 89.3 08/16/2013   MCH 29.8 08/16/2013   RDW 13.4 08/16/2013   PLT 272 41/74/0814   Last metabolic panel Lab Results  Component Value Date   GLUCOSE 95 09/27/2020   NA 140 09/27/2020   K 4.7 09/27/2020   CL 104 09/27/2020   CO2 23 09/27/2020   BUN 15 09/27/2020   CREATININE 0.64 09/27/2020   EGFR 102 09/27/2020   CALCIUM 9.7 09/27/2020   PROT 6.7 09/27/2020   ALBUMIN 4.6 09/27/2020   LABGLOB 2.1 09/27/2020   AGRATIO 2.2 09/27/2020   BILITOT 0.3 09/27/2020   ALKPHOS 69 09/27/2020   AST 17 09/27/2020   ALT 24 09/27/2020   ANIONGAP 10 08/16/2013   Last lipids Lab Results  Component Value Date   CHOL 158 09/22/2019   HDL 44 03/19/2019   LDLCALC 85 09/22/2019   TRIG 179 (A) 09/22/2019   Last hemoglobin A1c Lab Results  Component Value Date   HGBA1C 6.0 (H) 09/27/2021    Last thyroid functions Lab Results  Component Value Date   TSH 1.930 09/27/2021   Last vitamin D Lab Results  Component Value Date   VD25OH 71.2 09/27/2021    Assessment & Plan:   Problem List Items Addressed This Visit       Postsurgical hypothyroidism    Followed by endocrinology (Dr. Dorris Fetch).  Last seen 10/2.  She is currently prescribed Synthroid 88 mcg daily.      Hyperlipidemia    She is currently prescribed rosuvastatin 20 mg daily.  Repeat lipid panel ordered today.      Prediabetes    HbA1c 6.0 last month.  Followed by endocrinology.  We reviewed appropriate lifestyle modifications today.      Tobacco use    Currently smoking cigarettes.  She states that 1 pack will last 3-4 days.  She has been smoking at this rate since age 46.  She is aware of the need to quit and is making strides to do so.  I recommended choosing a quit date and finding alternatives to smoking when she experiences a craving. -The patient was counseled on the dangers of tobacco use, and was advised to quit.  Reviewed strategies to maximize success, including removing cigarettes and smoking materials from environment, stress management, substitution of other forms of reinforcement, support of family/friends and written materials.      Encounter to establish care - Primary    Presenting today to establish care.  Recent medical records and lab work reviewed. -Additional labs ordered today, including one-time HIV/HCV screening -Influenza vaccine administered today -She is scheduled for mammogram in December      Return in about 3 months (around 01/11/2022).   Johnette Abraham,  MD   

## 2021-10-11 NOTE — Patient Instructions (Signed)
It was a pleasure to see you today.  Thank you for giving [place] the opportunity to be involved in your care.  Below is a brief recap of your visit and next steps.  We will plan to see you again in 3 months.  Summary We will check labs today and you will receive your flu shot.  Next steps Follow up in 3 months I will notify you of lab results

## 2021-10-12 LAB — CMP14+EGFR
ALT: 26 IU/L (ref 0–32)
AST: 20 IU/L (ref 0–40)
Albumin/Globulin Ratio: 2.1 (ref 1.2–2.2)
Albumin: 4.8 g/dL (ref 3.8–4.9)
Alkaline Phosphatase: 74 IU/L (ref 44–121)
BUN/Creatinine Ratio: 24 — ABNORMAL HIGH (ref 9–23)
BUN: 16 mg/dL (ref 6–24)
Bilirubin Total: 0.2 mg/dL (ref 0.0–1.2)
CO2: 23 mmol/L (ref 20–29)
Calcium: 10.5 mg/dL — ABNORMAL HIGH (ref 8.7–10.2)
Chloride: 100 mmol/L (ref 96–106)
Creatinine, Ser: 0.68 mg/dL (ref 0.57–1.00)
Globulin, Total: 2.3 g/dL (ref 1.5–4.5)
Glucose: 88 mg/dL (ref 70–99)
Potassium: 4.6 mmol/L (ref 3.5–5.2)
Sodium: 142 mmol/L (ref 134–144)
Total Protein: 7.1 g/dL (ref 6.0–8.5)
eGFR: 100 mL/min/{1.73_m2} (ref >59)

## 2021-10-12 LAB — HCV AB W REFLEX TO QUANT PCR: HCV Ab: NONREACTIVE

## 2021-10-12 LAB — CBC WITH DIFFERENTIAL/PLATELET
Basophils Absolute: 0.1 10*3/uL (ref 0.0–0.2)
Basos: 1 %
EOS (ABSOLUTE): 0.7 10*3/uL — ABNORMAL HIGH (ref 0.0–0.4)
Eos: 8 %
Hematocrit: 45.5 % (ref 34.0–46.6)
Hemoglobin: 15.1 g/dL (ref 11.1–15.9)
Immature Grans (Abs): 0 10*3/uL (ref 0.0–0.1)
Immature Granulocytes: 0 %
Lymphocytes Absolute: 3.3 10*3/uL — ABNORMAL HIGH (ref 0.7–3.1)
Lymphs: 37 %
MCH: 29.7 pg (ref 26.6–33.0)
MCHC: 33.2 g/dL (ref 31.5–35.7)
MCV: 89 fL (ref 79–97)
Monocytes Absolute: 0.9 10*3/uL (ref 0.1–0.9)
Monocytes: 10 %
Neutrophils Absolute: 3.9 10*3/uL (ref 1.4–7.0)
Neutrophils: 44 %
Platelets: 296 10*3/uL (ref 150–450)
RBC: 5.09 x10E6/uL (ref 3.77–5.28)
RDW: 12.7 % (ref 11.7–15.4)
WBC: 8.8 10*3/uL (ref 3.4–10.8)

## 2021-10-12 LAB — LIPID PANEL
Chol/HDL Ratio: 4.1 ratio (ref 0.0–4.4)
Cholesterol, Total: 168 mg/dL (ref 100–199)
HDL: 41 mg/dL (ref >39)
LDL Chol Calc (NIH): 82 mg/dL (ref 0–99)
Triglycerides: 274 mg/dL — ABNORMAL HIGH (ref 0–149)
VLDL Cholesterol Cal: 45 mg/dL — ABNORMAL HIGH (ref 5–40)

## 2021-10-12 LAB — HCV INTERPRETATION

## 2021-10-12 LAB — HIV ANTIBODY (ROUTINE TESTING W REFLEX): HIV Screen 4th Generation wRfx: NONREACTIVE

## 2021-12-08 ENCOUNTER — Ambulatory Visit (HOSPITAL_COMMUNITY)
Admission: RE | Admit: 2021-12-08 | Discharge: 2021-12-08 | Disposition: A | Discharge status: Home / Self Care | Payer: 59 | Source: Ambulatory Visit | Attending: Internal Medicine | Admitting: Internal Medicine

## 2021-12-08 DIAGNOSIS — Z1231 Encounter for screening mammogram for malignant neoplasm of breast: Secondary | ICD-10-CM | POA: Diagnosis not present

## 2022-01-13 ENCOUNTER — Encounter: Payer: Self-pay | Admitting: Internal Medicine

## 2022-01-13 ENCOUNTER — Ambulatory Visit: Payer: Medicaid Other | Admitting: Internal Medicine

## 2022-01-13 VITALS — BP 108/72 | HR 85 | Ht 64.5 in | Wt 162.8 lb

## 2022-01-13 DIAGNOSIS — Z720 Tobacco use: Secondary | ICD-10-CM

## 2022-01-13 DIAGNOSIS — E782 Mixed hyperlipidemia: Secondary | ICD-10-CM | POA: Diagnosis not present

## 2022-01-13 MED ORDER — NICOTINE POLACRILEX 2 MG MT LOZG: 2.0000 mg | LOZENGE | OROMUCOSAL | 0 refills | Status: DC | PRN

## 2022-01-13 NOTE — Patient Instructions (Signed)
It was a pleasure to see you today.  Thank you for giving [place] the opportunity to be involved in your care.  Below is a brief recap of your visit and next steps.  We will plan to see you again in 6 months.  Summary I have prescribed nicotine hard candies today to help with smoking cessation. We will plan for follow up in 6 months

## 2022-01-13 NOTE — Progress Notes (Unsigned)
Established Patient Office Visit  Subjective   Patient ID: [name], female    DOB: Jul 21, 1960  Age: 62 y.o. MRN: [id number]  Chief Complaint  Patient presents with   Hyperlipidemia    Follow up   [name] returns to care today. She was last seen by me on 10/11/21 to establish care.  At that time she was counseled on smoking cessation and we reviewed cessation techniques.  17-monthfollow-up was arranged.  There have been no acute interval events.  Today [name] that she feels well.  She is asymptomatic and has no acute concerns to discuss today.  Past Medical History:  Diagnosis Date   Arthritis    Hypothyroidism    Past Surgical History:  Procedure Laterality Date   APPENDECTOMY     Bilateral bunionectomy     COLONOSCOPY N/A 01/01/2015   Procedure: COLONOSCOPY;  Surgeon: SDanie Binder MD;  [facility];  Service: Endoscopy;  Laterality: N/A;  11:15 AM - pt knows to arrive at 11:15   LID LESION EXCISION Right 12/29/2020   Procedure: LID LESION EXCISION;  Surgeon: SGevena Cotton MD;  Location: [facility]  Service: Ophthalmology;  Laterality: Right;  LOCAL   THYROIDECTOMY N/A 08/15/2013   Procedure: TOTAL THYROIDECTOMY;  Surgeon: MJamesetta So MD;  Location: [facility];  Service: General;  Laterality: N/A;   Social History   Tobacco Use   Smoking status: Every Day    Packs/day: 0.10    Years: 15.00    Total pack years: 1.50    Types: Cigarettes   Smokeless tobacco: Never  Vaping Use   Vaping Use: Never used  Substance Use Topics   Alcohol use: No   Drug use: No   History reviewed. No pertinent family history. No Known Allergies  Review of Systems  Constitutional:  Negative for chills and fever.  HENT:  Negative for sore throat.   Respiratory:  Negative for cough and shortness of breath.   Cardiovascular:  Negative for chest pain, palpitations and leg swelling.  Gastrointestinal:  Negative for abdominal pain, blood in  stool, constipation, diarrhea, nausea and vomiting.  Genitourinary:  Negative for dysuria and hematuria.  Musculoskeletal:  Negative for myalgias.  Skin:  Negative for itching and rash.  Neurological:  Negative for dizziness and headaches.  Psychiatric/Behavioral:  Negative for depression and suicidal ideas.      Objective:     BP 108/72   Pulse 85   Ht 5' 4.5" (1.638 m)   Wt 162 lb 12.8 oz (73.8 kg)   LMP 01/11/2013   SpO2 94%   BMI 27.51 kg/m  BP Readings from Last 3 Encounters:  01/13/22 108/72  10/11/21 115/76  10/03/21 105/68   Physical Exam Constitutional:      General: She is not in acute distress.    Appearance: Normal appearance. She is not toxic-appearing.  HENT:     Head: Normocephalic and atraumatic.     Right Ear: External ear normal.     Left Ear: External ear normal.     Nose: Nose normal. No congestion or rhinorrhea.     Mouth/Throat:     Mouth: Mucous membranes are moist.     Pharynx: Oropharynx is clear. No oropharyngeal exudate or posterior oropharyngeal erythema.  Eyes:     General: No scleral icterus.    Extraocular Movements: Extraocular movements intact.     Conjunctiva/sclera: Conjunctivae normal.     Pupils: Pupils are equal, round,  and reactive to light.  Neck:     Comments: Well-healed surgical scar from thyroidectomy on anterior neck Cardiovascular:     Rate and Rhythm: Normal rate and regular rhythm.     Pulses: Normal pulses.     Heart sounds: Normal heart sounds. No murmur heard.    No friction rub. No gallop.  Pulmonary:     Effort: Pulmonary effort is normal.     Breath sounds: Normal breath sounds. No wheezing, rhonchi or rales.  Abdominal:     General: Abdomen is flat. Bowel sounds are normal. There is no distension.     Palpations: Abdomen is soft.     Tenderness: There is no abdominal tenderness.  Musculoskeletal:        General: Normal range of motion.     Cervical back: Normal range of motion. No tenderness.     Right  lower leg: No edema.     Left lower leg: No edema.  Lymphadenopathy:     Cervical: No cervical adenopathy.  Skin:    General: Skin is warm and dry.     Capillary Refill: Capillary refill takes less than 2 seconds.     Coloration: Skin is not jaundiced.  Neurological:     General: No focal deficit present.     Mental Status: She is alert and oriented to person, place, and time.     Motor: No weakness.  Psychiatric:        Mood and Affect: Mood normal.        Behavior: Behavior normal.   Last CBC Lab Results  Component Value Date   WBC 8.8 10/11/2021   HGB 15.1 10/11/2021   HCT 45.5 10/11/2021   MCV 89 10/11/2021   MCH 29.7 10/11/2021   RDW 12.7 10/11/2021   PLT 296 39/03/90   Last metabolic panel Lab Results  Component Value Date   GLUCOSE 88 10/11/2021   NA 142 10/11/2021   K 4.6 10/11/2021   CL 100 10/11/2021   CO2 23 10/11/2021   BUN 16 10/11/2021   CREATININE 0.68 10/11/2021   EGFR 100 10/11/2021   CALCIUM 10.5 (H) 10/11/2021   PROT 7.1 10/11/2021   ALBUMIN 4.8 10/11/2021   LABGLOB 2.3 10/11/2021   AGRATIO 2.1 10/11/2021   BILITOT 0.2 10/11/2021   ALKPHOS 74 10/11/2021   AST 20 10/11/2021   ALT 26 10/11/2021   ANIONGAP 10 08/16/2013   Last lipids Lab Results  Component Value Date   CHOL 168 10/11/2021   HDL 41 10/11/2021   LDLCALC 82 10/11/2021   TRIG 274 (H) 10/11/2021   CHOLHDL 4.1 10/11/2021   Last hemoglobin A1c Lab Results  Component Value Date   HGBA1C 6.0 (H) 09/27/2021   Last thyroid functions Lab Results  Component Value Date   TSH 1.930 09/27/2021   Last vitamin D Lab Results  Component Value Date   VD25OH 71.2 09/27/2021   The 10-year ASCVD risk score (Arnett DK, et al., 2019) is: 5%    Assessment & Plan:   Problem List Items Addressed This Visit       Hyperlipidemia    Lipid panel updated in October.  Total cholesterol 168 and LDL 82.  Well-controlled on rosuvastatin 20 mg daily.  No changes today.      Tobacco use  - Primary    She continues to smoke cigarettes daily but reports that her frequency has decreased to 1 pack/week.  She expresses again today a desire for complete cessation.  She  notes that she previously quit smoking cigarettes from 2014-2019.  She is interested in options for cessation assistance. -We reviewed NRT products and she is interested in trying nicotine lozenges.        Return in about 6 months (around 07/14/2022).    Johnette Abraham, MD

## 2022-01-15 NOTE — Assessment & Plan Note (Signed)
She continues to smoke cigarettes daily but reports that her frequency has decreased to 1 pack/week.  She expresses again today a desire for complete cessation.  She notes that she previously quit smoking cigarettes from 2014-2019.  She is interested in options for cessation assistance. -We reviewed NRT products and she is interested in trying nicotine lozenges.  These have been prescribed today.

## 2022-01-15 NOTE — Assessment & Plan Note (Signed)
Lipid panel updated in October.  Total cholesterol 168 and LDL 82.  Well-controlled on rosuvastatin 20 mg daily.  No changes today.

## 2022-01-30 ENCOUNTER — Telehealth: Payer: Self-pay | Admitting: Internal Medicine

## 2022-01-30 ENCOUNTER — Other Ambulatory Visit: Payer: Self-pay | Admitting: Internal Medicine

## 2022-01-30 DIAGNOSIS — E782 Mixed hyperlipidemia: Secondary | ICD-10-CM

## 2022-01-30 NOTE — Telephone Encounter (Signed)
Pt wants to know why her medication refill was denied

## 2022-01-30 NOTE — Telephone Encounter (Signed)
Patient needs refill on  90 day supply    rosuvastatin (CRESTOR) 20 MG tablet   naproxen (NAPROSYN) 500 MG tablet   [facility] - [place], [place] - [address] [place], [place] [address] Phone: [phone]  Fax: [phone]

## 2022-01-31 MED ORDER — ROSUVASTATIN CALCIUM 20 MG PO TABS: 20.0000 mg | ORAL_TABLET | Freq: Every day | ORAL | 1 refills | Status: DC

## 2022-01-31 MED ORDER — NAPROXEN 500 MG PO TABS: 500.0000 mg | ORAL_TABLET | Freq: Two times a day (BID) | ORAL | 1 refills | Status: DC | PRN

## 2022-01-31 NOTE — Telephone Encounter (Signed)
I sent refills to [facility].

## 2022-01-31 NOTE — Telephone Encounter (Signed)
Spoke with patient. Let her know that it it was sent to Dr. Doren Custard yesterday, but I am not able to refill it because it was filled by historical last.

## 2022-03-29 ENCOUNTER — Other Ambulatory Visit: Payer: Self-pay | Admitting: Internal Medicine

## 2022-04-26 ENCOUNTER — Telehealth: Payer: Self-pay | Admitting: Internal Medicine

## 2022-05-01 ENCOUNTER — Other Ambulatory Visit: Payer: Self-pay | Admitting: Internal Medicine

## 2022-05-01 ENCOUNTER — Other Ambulatory Visit: Payer: Self-pay

## 2022-05-01 DIAGNOSIS — E782 Mixed hyperlipidemia: Secondary | ICD-10-CM

## 2022-05-01 MED ORDER — NAPROXEN 500 MG PO TABS: ORAL_TABLET | ORAL | 0 refills | Status: DC

## 2022-05-01 MED ORDER — ROSUVASTATIN CALCIUM 20 MG PO TABS: 20.0000 mg | ORAL_TABLET | Freq: Every day | ORAL | 1 refills | Status: DC

## 2022-07-21 ENCOUNTER — Ambulatory Visit (INDEPENDENT_AMBULATORY_CARE_PROVIDER_SITE_OTHER): Payer: Medicaid Other | Admitting: Internal Medicine

## 2022-07-21 ENCOUNTER — Encounter: Payer: Self-pay | Admitting: Internal Medicine

## 2022-07-21 VITALS — BP 108/72 | HR 84 | Ht 64.5 in | Wt 162.6 lb

## 2022-07-21 DIAGNOSIS — E782 Mixed hyperlipidemia: Secondary | ICD-10-CM

## 2022-07-21 DIAGNOSIS — Z720 Tobacco use: Secondary | ICD-10-CM

## 2022-07-21 DIAGNOSIS — R7303 Prediabetes: Secondary | ICD-10-CM

## 2022-07-21 NOTE — Progress Notes (Signed)
Established Patient Office Visit  Subjective   Patient ID: [name], female    DOB: 1960-03-17  Age: 62 y.o. MRN: [id number]  Chief Complaint  Patient presents with   Hyperlipidemia    Six month follow up    [name] returns to care today for routine follow-up.  She was last evaluated by me on 1/12.  No medication changes were made at that time and 47-month follow-up was arranged.  There have been no acute interval events.  [name] reports feeling well today.  She is asymptomatic and has no acute concerns to discuss.  She continues to smoke cigarettes but is gradually working towards complete cessation.  Past Medical History:  Diagnosis Date   Arthritis    Hypothyroidism    Past Surgical History:  Procedure Laterality Date   APPENDECTOMY     Bilateral bunionectomy     COLONOSCOPY N/A 01/01/2015   Procedure: COLONOSCOPY;  Surgeon: West Bali, MD;  [facility]: [facility];  Service: Endoscopy;  Laterality: N/A;  11:15 AM - pt knows to arrive at 11:15   LID LESION EXCISION Right 12/29/2020   Procedure: LID LESION EXCISION;  Surgeon: [name], MD;  Location: [facility];  Service: Ophthalmology;  Laterality: Right;  LOCAL   THYROIDECTOMY N/A 08/15/2013   Procedure: TOTAL THYROIDECTOMY;  Surgeon: [name], MD;  Location: [facility];  Service: General;  Laterality: N/A;   Social History   Tobacco Use   Smoking status: Every Day    Current packs/day: 0.10    Average packs/day: 0.1 packs/day for 15.0 years (1.5 ttl pk-yrs)    Types: Cigarettes   Smokeless tobacco: Never  Vaping Use   Vaping status: Never Used  Substance Use Topics   Alcohol use: No   Drug use: No   History reviewed. No pertinent family history. No Known Allergies  Review of Systems  Constitutional:  Negative for chills and fever.  HENT:  Negative for sore throat.   Respiratory:  Negative for cough and shortness of breath.   Cardiovascular:  Negative for chest pain,  palpitations and leg swelling.  Gastrointestinal:  Negative for abdominal pain, blood in stool, constipation, diarrhea, nausea and vomiting.  Genitourinary:  Negative for dysuria and hematuria.  Musculoskeletal:  Negative for myalgias.  Skin:  Negative for itching and rash.  Neurological:  Negative for dizziness and headaches.  Psychiatric/Behavioral:  Negative for depression and suicidal ideas.      Objective:     BP 108/72   Pulse 84   Ht 5' 4.5" (1.638 m)   Wt 162 lb 9.6 oz (73.8 kg)   LMP 01/11/2013   SpO2 94%   BMI 27.48 kg/m  BP Readings from Last 3 Encounters:  07/21/22 108/72  01/13/22 108/72  10/11/21 115/76   Physical Exam Constitutional:      General: She is not in acute distress.    Appearance: Normal appearance. She is not toxic-appearing.  HENT:     Head: Normocephalic and atraumatic.     Right Ear: External ear normal.     Left Ear: External ear normal.     Nose: Nose normal. No congestion or rhinorrhea.     Mouth/Throat:     Mouth: Mucous membranes are moist.     Pharynx: Oropharynx is clear. No oropharyngeal exudate or posterior oropharyngeal erythema.  Eyes:     General: No scleral icterus.    Extraocular Movements: Extraocular movements intact.     Conjunctiva/sclera: Conjunctivae  normal.     Pupils: Pupils are equal, round, and reactive to light.  Neck:     Comments: Well-healed surgical scar from thyroidectomy on anterior neck Cardiovascular:     Rate and Rhythm: Normal rate and regular rhythm.     Pulses: Normal pulses.     Heart sounds: Normal heart sounds. No murmur heard.    No friction rub. No gallop.  Pulmonary:     Effort: Pulmonary effort is normal.     Breath sounds: Normal breath sounds. No wheezing, rhonchi or rales.  Abdominal:     General: Abdomen is flat. Bowel sounds are normal. There is no distension.     Palpations: Abdomen is soft.     Tenderness: There is no abdominal tenderness.  Musculoskeletal:        General: Normal  range of motion.     Cervical back: Normal range of motion. No tenderness.     Right lower leg: No edema.     Left lower leg: No edema.  Lymphadenopathy:     Cervical: No cervical adenopathy.  Skin:    General: Skin is warm and dry.     Capillary Refill: Capillary refill takes less than 2 seconds.     Coloration: Skin is not jaundiced.  Neurological:     General: No focal deficit present.     Mental Status: She is alert and oriented to person, place, and time.     Motor: No weakness.  Psychiatric:        Mood and Affect: Mood normal.        Behavior: Behavior normal.   Last CBC Lab Results  Component Value Date   WBC 8.8 10/11/2021   HGB 15.1 10/11/2021   HCT 45.5 10/11/2021   MCV 89 10/11/2021   MCH 29.7 10/11/2021   RDW 12.7 10/11/2021   PLT 296 10/11/2021   Last metabolic panel Lab Results  Component Value Date   GLUCOSE 88 10/11/2021   NA 142 10/11/2021   K 4.6 10/11/2021   CL 100 10/11/2021   CO2 23 10/11/2021   BUN 16 10/11/2021   CREATININE 0.68 10/11/2021   EGFR 100 10/11/2021   CALCIUM 10.5 (H) 10/11/2021   PROT 7.1 10/11/2021   ALBUMIN 4.8 10/11/2021   LABGLOB 2.3 10/11/2021   AGRATIO 2.1 10/11/2021   BILITOT 0.2 10/11/2021   ALKPHOS 74 10/11/2021   AST 20 10/11/2021   ALT 26 10/11/2021   ANIONGAP 10 08/16/2013   Last lipids Lab Results  Component Value Date   CHOL 168 10/11/2021   HDL 41 10/11/2021   LDLCALC 82 10/11/2021   TRIG 274 (H) 10/11/2021   CHOLHDL 4.1 10/11/2021   Last hemoglobin A1c Lab Results  Component Value Date   HGBA1C 6.0 (H) 09/27/2021   Last thyroid functions Lab Results  Component Value Date   TSH 1.930 09/27/2021   Last vitamin D Lab Results  Component Value Date   VD25OH 71.2 09/27/2021   The 10-year ASCVD risk score (Arnett DK, et al., 2019) is: 5%    Assessment & Plan:   Problem List Items Addressed This Visit       Hyperlipidemia    Currently prescribed rosuvastatin 20 mg daily.  Lipid panel last  updated in October 2023. -Repeat lipid panel at follow-up in 6 months      Prediabetes    A1c 6.0 on labs from September.  She has been focusing on lifestyle modifications aimed at improving her blood sugar. -Repeat A1c  at follow-up in 6 months      Tobacco use - Primary    Currently smoking less than 1 pack/week of cigarettes and again expresses a desire to achieve complete cessation.  She states that it has been difficult to completely quit, though she was able to successfully quit from 2014-2019. -The patient was counseled on the dangers of tobacco use, and was advised to quit.  Reviewed strategies to maximize success, including removing cigarettes and smoking materials from environment, stress management, substitution of other forms of reinforcement, support of family/friends, and written materials.        Return in about 6 months (around 01/21/2023).    Billie Lade, MD

## 2022-07-21 NOTE — Patient Instructions (Signed)
It was a pleasure to see you today.  Thank you for giving [place] the opportunity to be involved in your care.  Below is a brief recap of your visit and next steps.  We will plan to see you again in 6 months.  Summary No medication changes today We will follow up in 6 months and repeat labs at that time

## 2022-07-27 NOTE — Assessment & Plan Note (Signed)
Currently smoking less than 1 pack/week of cigarettes and again expresses a desire to achieve complete cessation.  She states that it has been difficult to completely quit, though she was able to successfully quit from 2014-2019. -The patient was counseled on the dangers of tobacco use, and was advised to quit.  Reviewed strategies to maximize success, including removing cigarettes and smoking materials from environment, stress management, substitution of other forms of reinforcement, support of family/friends, and written materials.

## 2022-07-27 NOTE — Assessment & Plan Note (Signed)
A1c 6.0 on labs from September.  She has been focusing on lifestyle modifications aimed at improving her blood sugar. -Repeat A1c at follow-up in 6 months

## 2022-07-27 NOTE — Assessment & Plan Note (Signed)
Currently prescribed rosuvastatin 20 mg daily.  Lipid panel last updated in October 2023. -Repeat lipid panel at follow-up in 6 months

## 2022-09-26 ENCOUNTER — Other Ambulatory Visit: Payer: Self-pay

## 2022-09-26 DIAGNOSIS — E890 Postprocedural hypothyroidism: Secondary | ICD-10-CM

## 2022-09-26 DIAGNOSIS — R7303 Prediabetes: Secondary | ICD-10-CM

## 2022-09-26 DIAGNOSIS — E673 Hypervitaminosis D: Secondary | ICD-10-CM

## 2022-10-03 ENCOUNTER — Telehealth: Payer: Self-pay | Admitting: Nurse Practitioner

## 2022-10-03 MED ORDER — LEVOTHYROXINE SODIUM 88 MCG PO TABS: 88.0000 ug | ORAL_TABLET | Freq: Every day | ORAL | 1 refills | Status: DC

## 2022-10-03 NOTE — Telephone Encounter (Signed)
Pt is asking for a refill on Levothyroxine and to be sent to [facility]

## 2022-10-03 NOTE — Telephone Encounter (Signed)
Rx sent 

## 2022-10-04 ENCOUNTER — Ambulatory Visit: Payer: 59 | Admitting: Nurse Practitioner

## 2022-10-06 DIAGNOSIS — E890 Postprocedural hypothyroidism: Secondary | ICD-10-CM | POA: Diagnosis not present

## 2022-10-06 DIAGNOSIS — R7303 Prediabetes: Secondary | ICD-10-CM | POA: Diagnosis not present

## 2022-10-06 DIAGNOSIS — E673 Hypervitaminosis D: Secondary | ICD-10-CM | POA: Diagnosis not present

## 2022-10-07 LAB — TSH: TSH: 3.21 u[IU]/mL (ref 0.450–4.500)

## 2022-10-07 LAB — COMPREHENSIVE METABOLIC PANEL
ALT: 26 [IU]/L (ref 0–32)
AST: 19 [IU]/L (ref 0–40)
Albumin: 4.8 g/dL (ref 3.8–4.9)
Alkaline Phosphatase: 68 [IU]/L (ref 44–121)
BUN/Creatinine Ratio: 17 (ref 12–28)
BUN: 13 mg/dL (ref 8–27)
Bilirubin Total: 0.3 mg/dL (ref 0.0–1.2)
CO2: 23 mmol/L (ref 20–29)
Calcium: 9.7 mg/dL (ref 8.7–10.3)
Chloride: 103 mmol/L (ref 96–106)
Creatinine, Ser: 0.76 mg/dL (ref 0.57–1.00)
Globulin, Total: 2.2 g/dL (ref 1.5–4.5)
Glucose: 94 mg/dL (ref 70–99)
Potassium: 5.2 mmol/L (ref 3.5–5.2)
Sodium: 142 mmol/L (ref 134–144)
Total Protein: 7 g/dL (ref 6.0–8.5)
eGFR: 90 mL/min/{1.73_m2} (ref >59)

## 2022-10-07 LAB — HEMOGLOBIN A1C
Est. average glucose Bld gHb Est-mCnc: 123 mg/dL
Hgb A1c MFr Bld: 5.9 % — ABNORMAL HIGH (ref 4.8–5.6)

## 2022-10-07 LAB — VITAMIN D 25 HYDROXY (VIT D DEFICIENCY, FRACTURES): Vit D, 25-Hydroxy: 94 ng/mL (ref 30.0–100.0)

## 2022-10-07 LAB — T4, FREE: Free T4: 1.45 ng/dL (ref 0.82–1.77)

## 2022-10-26 ENCOUNTER — Ambulatory Visit: Payer: Medicaid Other | Admitting: Nurse Practitioner

## 2022-10-26 ENCOUNTER — Encounter: Payer: Self-pay | Admitting: Nurse Practitioner

## 2022-10-26 VITALS — BP 118/70 | HR 72 | Ht 64.5 in | Wt 159.2 lb

## 2022-10-26 DIAGNOSIS — E673 Hypervitaminosis D: Secondary | ICD-10-CM | POA: Diagnosis not present

## 2022-10-26 DIAGNOSIS — E890 Postprocedural hypothyroidism: Secondary | ICD-10-CM | POA: Diagnosis not present

## 2022-10-26 DIAGNOSIS — R7303 Prediabetes: Secondary | ICD-10-CM | POA: Diagnosis not present

## 2022-10-26 MED ORDER — LEVOTHYROXINE SODIUM 88 MCG PO TABS: 88.0000 ug | ORAL_TABLET | Freq: Every day | ORAL | 3 refills | Status: DC

## 2022-10-26 NOTE — Progress Notes (Signed)
10/26/2022   Endocrinology follow-up note   Subjective:    Patient ID: [name], female    DOB: [birth year], PCP Billie Lade, MD   Past Medical History:  Diagnosis Date   Arthritis    Hypothyroidism    Past Surgical History:  Procedure Laterality Date   APPENDECTOMY     Bilateral bunionectomy     COLONOSCOPY N/A 01/01/2015   Procedure: COLONOSCOPY;  Surgeon: West Bali, MD;  Location: [facility];  Service: Endoscopy;  Laterality: N/A;  11:15 AM - pt knows to arrive at 11:15   LID LESION EXCISION Right 12/29/2020   Procedure: LID LESION EXCISION;  Surgeon: [name], MD;  Location: [facility];  Service: Ophthalmology;  Laterality: Right;  LOCAL   THYROIDECTOMY N/A 08/15/2013   Procedure: TOTAL THYROIDECTOMY;  Surgeon: [name], MD;  Location: [facility];  Service: General;  Laterality: N/A;   Social History   Socioeconomic History   Marital status: Married    Spouse name: Not on file   Number of children: Not on file   Years of education: Not on file   Highest education level: Not on file  Occupational History   Not on file  Tobacco Use   Smoking status: Every Day    Current packs/day: 0.10    Average packs/day: 0.1 packs/day for 15.0 years (1.5 ttl pk-yrs)    Types: Cigarettes   Smokeless tobacco: Never  Vaping Use   Vaping status: Never Used  Substance and Sexual Activity   Alcohol use: No   Drug use: No   Sexual activity: Yes    Birth control/protection: None  Other Topics Concern   Not on file  Social History Narrative   Not on file   Social Determinants of Health   Financial Resource Strain: Low Risk  (05/06/2020)   Overall Financial Resource Strain (CARDIA)    Difficulty of Paying Living Expenses: Not hard at all  Food Insecurity: No Food Insecurity (05/06/2020)   Hunger Vital Sign    Worried About Running Out of Food in the Last Year: Never true    Ran Out of Food in the Last Year: Never true  Transportation  Needs: No Transportation Needs (05/06/2020)   PRAPARE - Administrator, Civil Service (Medical): No    Lack of Transportation (Non-Medical): No  Physical Activity: Sufficiently Active (05/06/2020)   Exercise Vital Sign    Days of Exercise per Week: 5 days    Minutes of Exercise per Session: 30 min  Stress: No Stress Concern Present (05/06/2020)   [employer] of Occupational Health - Occupational Stress Questionnaire    Feeling of Stress : Not at all  Social Connections: Moderately Isolated (05/06/2020)   Social Connection and Isolation Panel [NHANES]    Frequency of Communication with Friends and Family: More than three times a week    Frequency of Social Gatherings with Friends and Family: More than three times a week    Attends Religious Services: Never    Database administrator or Organizations: No    Attends Banker Meetings: Never    Marital Status: Living with partner   Outpatient Encounter Medications as of 10/26/2022  Medication Sig   Multiple Vitamin (MULTIVITAMIN) tablet Take 1 tablet by mouth daily.   nicotine polacrilex (NICOTINE MINI) 2 MG lozenge Take 1 lozenge (2 mg total) by mouth as needed for smoking cessation.   rosuvastatin (CRESTOR) 20 MG tablet Take 1 tablet (  20 mg total) by mouth daily.   [DISCONTINUED] levothyroxine (SYNTHROID) 88 MCG tablet Take 1 tablet (88 mcg total) by mouth daily before breakfast.   levothyroxine (SYNTHROID) 88 MCG tablet Take 1 tablet (88 mcg total) by mouth daily before breakfast.   [DISCONTINUED] Ascorbic Acid (VITAMIN C) 1000 MG tablet Take 1,000 mg by mouth daily. (Patient not taking: Reported on 10/26/2022)   [DISCONTINUED] naproxen (NAPROSYN) 500 MG tablet TAKE (1) TABLET BY MOUTH TWO TIMES DAILY AS NEEDED FOR MODERATE PAIN. (Patient not taking: Reported on 10/26/2022)   No facility-administered encounter medications on file as of 10/26/2022.   ALLERGIES: No Known Allergies VACCINATION STATUS: Immunization  History  Administered Date(s) Administered   Influenza,inj,Quad PF,6+ Mos 10/11/2021   Tdap 09/24/2019    Thyroid Problem Presents for follow-up visit. Patient reports no anxiety, cold intolerance, constipation, depressed mood, diarrhea, dry skin, fatigue, hair loss, heat intolerance, leg swelling, palpitations, tremors, weight gain or weight loss. The symptoms have been stable.     62 year old female  with medical history significant for multinodular goiter status post prior radioactive iodine ablation, and completion thyroidectomy on 08/15/13 with benign outcomes. She is currently on Levothyroxine 88 mcg p.o. daily before breakfast for postsurgical hypothyroidism.  She is compliant to her medications.  She has been stable on this medication for quite some time now.  She has no new complaints today.   -She denies cold/hot intolerance. -She has a steady energy level.  She has family hx of thyroid nodules, but no family hx of thyroid cancer.   Review of systems  Constitutional: + Minimally fluctuating body weight,  current Body mass index is 26.9 kg/m. , no fatigue, no subjective hyperthermia, no subjective hypothermia Eyes: no blurry vision, no xerophthalmia ENT: no sore throat, no nodules palpated in throat, no dysphagia/odynophagia, no hoarseness Cardiovascular: no chest pain, no shortness of breath, no palpitations, no leg swelling Respiratory: no cough, no shortness of breath Gastrointestinal: no nausea/vomiting/diarrhea Musculoskeletal: no muscle/joint aches Skin: no rashes, no hyperemia Neurological: no tremors, no numbness, no tingling, no dizziness Psychiatric: no depression, no anxiety  Objective:    BP 118/70 (BP Location: Right Arm, Patient Position: Sitting, Cuff Size: Large)   Pulse 72   Ht 5' 4.5" (1.638 m)   Wt 159 lb 3.2 oz (72.2 kg)   LMP 01/11/2013   BMI 26.90 kg/m   Wt Readings from Last 3 Encounters:  10/26/22 159 lb 3.2 oz (72.2 kg)  07/21/22 162 lb  9.6 oz (73.8 kg)  01/13/22 162 lb 12.8 oz (73.8 kg)    BP Readings from Last 3 Encounters:  10/26/22 118/70  07/21/22 108/72  01/13/22 108/72    Physical Exam- Limited  Constitutional:  There is no height or weight on file to calculate BMI. , not in acute distress, normal state of mind Eyes:  EOMI, no exophthalmos Musculoskeletal: no gross deformities, strength intact in all four extremities, no gross restriction of joint movements Skin:  no rashes, no hyperemia Neurological: no tremor with outstretched hands     CMP     Component Value Date/Time   NA 142 10/06/2022 1117   K 5.2 10/06/2022 1117   CL 103 10/06/2022 1117   CO2 23 10/06/2022 1117   GLUCOSE 94 10/06/2022 1117   GLUCOSE 94 08/16/2013 0547   BUN 13 10/06/2022 1117   CREATININE 0.76 10/06/2022 1117   CALCIUM 9.7 10/06/2022 1117   PROT 7.0 10/06/2022 1117   ALBUMIN 4.8 10/06/2022 1117   AST 19 10/06/2022  1117   ALT 26 10/06/2022 1117   ALKPHOS 68 10/06/2022 1117   BILITOT 0.3 10/06/2022 1117   GFRNONAA 83 09/23/2019 0000   GFRAA >90 08/16/2013 0547   Recent Results (from the past 2160 hour(s))  Vitamin D, 25-hydroxy     Status: None   Collection Time: 10/06/22 11:17 AM  Result Value Ref Range   Vit D, 25-Hydroxy 94.0 30.0 - 100.0 ng/mL    Comment: Vitamin D deficiency has been defined by the Institute of Medicine and an Endocrine Society practice guideline as a level of serum 25-OH vitamin D less than 20 ng/mL (1,2). The Endocrine Society went on to further define vitamin D insufficiency as a level between 21 and 29 ng/mL (2). 1. IOM (Institute of Medicine). 2010. Dietary reference    intakes for calcium and D. [place]: The    [employer]. 2. Holick MF, Binkley Bentley, Bischoff-Ferrari HA, et al.    Evaluation, treatment, and prevention of vitamin D    deficiency: an Endocrine Society clinical practice    guideline. JCEM. 2011 Jul; 96(7):1911-30.   Hemoglobin A1c     Status: Abnormal    Collection Time: 10/06/22 11:17 AM  Result Value Ref Range   Hgb A1c MFr Bld 5.9 (H) 4.8 - 5.6 %    Comment:          Prediabetes: 5.7 - 6.4          Diabetes: >6.4          Glycemic control for adults with diabetes: <7.0    Est. average glucose Bld gHb Est-mCnc 123 mg/dL  Comprehensive metabolic panel     Status: None   Collection Time: 10/06/22 11:17 AM  Result Value Ref Range   Glucose 94 70 - 99 mg/dL   BUN 13 8 - 27 mg/dL   Creatinine, Ser 4.09 0.57 - 1.00 mg/dL   eGFR 90 >81 XB/JYN/8.29   BUN/Creatinine Ratio 17 12 - 28   Sodium 142 134 - 144 mmol/L   Potassium 5.2 3.5 - 5.2 mmol/L   Chloride 103 96 - 106 mmol/L   CO2 23 20 - 29 mmol/L   Calcium 9.7 8.7 - 10.3 mg/dL   Total Protein 7.0 6.0 - 8.5 g/dL   Albumin 4.8 3.8 - 4.9 g/dL   Globulin, Total 2.2 1.5 - 4.5 g/dL   Bilirubin Total 0.3 0.0 - 1.2 mg/dL   Alkaline Phosphatase 68 44 - 121 IU/L   AST 19 0 - 40 IU/L   ALT 26 0 - 32 IU/L  T4, Free     Status: None   Collection Time: 10/06/22 11:17 AM  Result Value Ref Range   Free T4 1.45 0.82 - 1.77 ng/dL  TSH     Status: None   Collection Time: 10/06/22 11:17 AM  Result Value Ref Range   TSH 3.210 0.450 - 4.500 uIU/mL    Latest Reference Range & Units 03/19/19 00:00 03/19/19 14:34 09/23/19 00:00 09/27/20 10:37 09/27/21 10:06 10/06/22 11:17  TSH 0.450 - 4.500 uIU/mL 1.01 (E)  1.50 (E) 1.040 1.930 3.210  T4,Free(Direct) 0.82 - 1.77 ng/dL  1.4  5.62 (H) 1.30 8.65  (H): Data is abnormally high (E): External lab result  Assessment & Plan:   1. Postsurgical hypothyroidism -s/p total thyroidectomy Her previsit thyroid function tests are consistent with appropriate replacement.  She is advised to continue Levothyroxine 88 mcg p.o. daily before breakfast. Will recheck TFTs prior to next visit in 1 year.    -  We discussed about the correct intake of her thyroid hormone, on empty stomach at fasting, with water, separated by at least 30 minutes from breakfast and other  medications,  and separated by more than 4 hours from calcium, iron, multivitamins, acid reflux medications (PPIs). -Patient is made aware of the fact that thyroid hormone replacement is needed for life, dose to be adjusted by periodic monitoring of thyroid function tests.  2. Prediabetes Her previsit A1C is 5.9%, essentially the same as last year.  Still in the prediabetes category.  She does not need medication at this time.  She is encouraged to continue to work on lifestyle modifications.  - Nutritional counseling repeated at each appointment due to patients tendency to fall back in to old habits.  - The patient admits there is a room for improvement in their diet and drink choices. -  Suggestion is made for the patient to avoid simple carbohydrates from their diet including Cakes, Sweet Desserts / Pastries, Ice Cream, Soda (diet and regular), Sweet Tea, Candies, Chips, Cookies, Sweet Pastries, Store Bought Juices, Alcohol in Excess of 1-2 drinks a day, Artificial Sweeteners, Coffee Creamer, and "Sugar-free" Products. This will help patient to have stable blood glucose profile and potentially avoid unintended weight gain.   - I encouraged the patient to switch to unprocessed or minimally processed complex starch and increased protein intake (animal or plant source), fruits, and vegetables.   - Patient is advised to stick to a routine mealtimes to eat 3 meals a day and avoid unnecessary snacks (to snack only to correct hypoglycemia).  3. Hypervitaminosis D Her repeat vitamin D level on 10/06/22 was normal at 94. She is taking MVI with Vitamin D in it, advised to continue.   - I advised patient to maintain close follow up with Durwin Nora Lucina Mellow, MD for primary care needs.     I spent  25  minutes in the care of the patient today including review of labs from Thyroid Function, CMP, and other relevant labs ; imaging/biopsy records (current and previous including abstractions from other  facilities); face-to-face time discussing  her lab results and symptoms, medications doses, her options of short and long term treatment based on the latest standards of care / guidelines;   and documenting the encounter.  Brand Males Bodiford  participated in the discussions, expressed understanding, and voiced agreement with the above plans.  All questions were answered to her satisfaction. she is encouraged to contact clinic should she have any questions or concerns prior to her return visit.   Follow up plan: Return in about 1 year (around 10/26/2023) for Thyroid follow up, Previsit labs.  Ronny Bacon, [facility] [address] [place], [place] [address] Phone: [phone] Fax: [phone]  10/26/2022, 8:23 AM

## 2022-11-22 ENCOUNTER — Other Ambulatory Visit: Payer: Self-pay | Admitting: Internal Medicine

## 2022-11-22 DIAGNOSIS — E782 Mixed hyperlipidemia: Secondary | ICD-10-CM

## 2022-11-26 ENCOUNTER — Ambulatory Visit
Admission: EM | Admit: 2022-11-26 | Discharge: 2022-11-26 | Disposition: A | Discharge status: Home / Self Care | Payer: Medicaid Other | Attending: Nurse Practitioner | Admitting: Nurse Practitioner

## 2022-11-26 DIAGNOSIS — H9202 Otalgia, left ear: Secondary | ICD-10-CM | POA: Diagnosis not present

## 2022-11-26 MED ORDER — CETIRIZINE HCL 10 MG PO TABS: 10.0000 mg | ORAL_TABLET | Freq: Every day | ORAL | 0 refills | Status: DC

## 2022-11-26 MED ORDER — OFLOXACIN 0.3 % OT SOLN: 5.0000 [drp] | Freq: Two times a day (BID) | OTIC | 0 refills | Status: AC

## 2022-11-26 NOTE — ED Triage Notes (Signed)
Pt reports she is having left ear pain x 1 week

## 2022-11-26 NOTE — ED Provider Notes (Signed)
[facility]    CSN: [id number] Arrival date & time: 11/26/22  1444      History   Chief Complaint Chief Complaint  Patient presents with   Otalgia    HPI [name] is a 62 y.o. female.   The history is provided by the patient.   Patient presents for complaints of left ear pain that been present for the past week.  Patient denies headache, dizziness, ear drainage, cough, abdominal pain, nausea, vomiting, or rash.  Patient states that she feels like there is something running down the left side of her throat.  She states that she has noticed that she does develop some nasal congestion when she goes outside.  She reports she is not taking any medication for her symptoms.  Past Medical History:  Diagnosis Date   Arthritis    Hypothyroidism     Patient Active Problem List   Diagnosis Date Noted   Tobacco use 10/11/2021   Encounter to establish care 10/11/2021   Prediabetes 03/31/2019   Hyperlipidemia 09/23/2015   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   Special screening for malignant neoplasms, colon    S/P thyroidectomy 08/15/2013    Past Surgical History:  Procedure Laterality Date   APPENDECTOMY     Bilateral bunionectomy     COLONOSCOPY N/A 01/01/2015   Procedure: COLONOSCOPY;  Surgeon: West Bali, MD;  [facility];  Service: Endoscopy;  Laterality: N/A;  11:15 AM - pt knows to arrive at 11:15   LID LESION EXCISION Right 12/29/2020   Procedure: LID LESION EXCISION;  Surgeon: [name], MD;  Location: [facility];  Service: Ophthalmology;  Laterality: Right;  LOCAL   THYROIDECTOMY N/A 08/15/2013   Procedure: TOTAL THYROIDECTOMY;  Surgeon: [name], MD;  Location: [facility];  Service: General;  Laterality: N/A;    OB History   No obstetric history on file.      Home Medications    Prior to Admission medications   Medication Sig Start Date End Date Taking? Authorizing Provider  cetirizine (ZYRTEC) 10 MG  tablet Take 1 tablet (10 mg total) by mouth daily. 11/26/22  Yes Leath-Warren, Sadie Haber, NP  ofloxacin (FLOXIN) 0.3 % OTIC solution Place 5 drops into the left ear 2 (two) times daily for 7 days. 11/26/22 12/03/22 Yes Leath-Warren, Sadie Haber, NP  levothyroxine (SYNTHROID) 88 MCG tablet Take 1 tablet (88 mcg total) by mouth daily before breakfast. 10/26/22   Dani Gobble, NP  Multiple Vitamin (MULTIVITAMIN) tablet Take 1 tablet by mouth daily.    [provider]  nicotine polacrilex (NICOTINE MINI) 2 MG lozenge Take 1 lozenge (2 mg total) by mouth as needed for smoking cessation. 01/13/22   Billie Lade, MD  rosuvastatin (CRESTOR) 20 MG tablet TAKE ONE TABLET BY MOUTH ONCE DAILY. 11/22/22   Billie Lade, MD    Family History History reviewed. No pertinent family history.  Social History Social History   Tobacco Use   Smoking status: Every Day    Current packs/day: 0.10    Average packs/day: 0.1 packs/day for 15.0 years (1.5 ttl pk-yrs)    Types: Cigarettes   Smokeless tobacco: Never  Vaping Use   Vaping status: Never Used  Substance Use Topics   Alcohol use: No   Drug use: No     Allergies   Patient has no known allergies.   Review of Systems Review of Systems Per HPI  Physical Exam Triage Vital Signs ED Triage  Vitals  Encounter Vitals Group     BP 11/26/22 1527 114/74     Systolic BP Percentile --      Diastolic BP Percentile --      Pulse Rate 11/26/22 1527 80     Resp 11/26/22 1527 16     Temp 11/26/22 1527 98.2 F (36.8 C)     Temp Source 11/26/22 1527 Oral     SpO2 11/26/22 1527 94 %     Weight --      Height --      Head Circumference --      Peak Flow --      Pain Score 11/26/22 1529 7     Pain Loc --      Pain Education --      Exclude from Growth Chart --    No data found.  Updated Vital Signs BP 114/74 (BP Location: Right Arm)   Pulse 80   Temp 98.2 F (36.8 C) (Oral)   Resp 16   LMP 01/11/2013   SpO2 94%   Visual  Acuity Right Eye Distance:   Left Eye Distance:   Bilateral Distance:    Right Eye Near:   Left Eye Near:    Bilateral Near:     Physical Exam Vitals and nursing note reviewed.  Constitutional:      General: She is not in acute distress.    Appearance: Normal appearance.  HENT:     Head: Normocephalic.     Right Ear: Ear canal and external ear normal.     Left Ear: Ear canal and external ear normal. A middle ear effusion is present.     Ears:     Comments: Dry skin noted in the right ear canal.  Left tympanic membrane appears to be "covered" with what appears to be dried skin.      Nose: Nose normal.     Mouth/Throat:     Mouth: Mucous membranes are moist.  Eyes:     Extraocular Movements: Extraocular movements intact.     Conjunctiva/sclera: Conjunctivae normal.     Pupils: Pupils are equal, round, and reactive to light.  Cardiovascular:     Rate and Rhythm: Normal rate and regular rhythm.     Pulses: Normal pulses.     Heart sounds: Normal heart sounds.  Pulmonary:     Effort: Pulmonary effort is normal. No respiratory distress.     Breath sounds: Normal breath sounds. No stridor. No wheezing, rhonchi or rales.  Abdominal:     General: Bowel sounds are normal.     Palpations: Abdomen is soft.     Tenderness: There is no abdominal tenderness.  Musculoskeletal:     Cervical back: Normal range of motion.  Lymphadenopathy:     Cervical: No cervical adenopathy.  Skin:    General: Skin is warm.  Neurological:     General: No focal deficit present.     Mental Status: She is alert and oriented to person, place, and time.  Psychiatric:        Mood and Affect: Mood normal.        Behavior: Behavior normal.      UC Treatments / Results  Labs (all labs ordered are listed, but only abnormal results are displayed) Labs Reviewed - No data to display  EKG   Radiology No results found.  Procedures Procedures (including critical care time)  Medications Ordered in  UC Medications - No data to display  Initial Impression /  Assessment and Plan / UC Course  I have reviewed the triage vital signs and the nursing notes.  Pertinent labs & imaging results that were available during my care of the patient were reviewed by me and considered in my medical decision making (see chart for details).  Difficult to determine the cause of the patient's symptoms.  She does have some fluid in the left ear consistent with a middle ear effusion.  She also has what appears to be "dried skin" covering the left tympanic membrane.  Will provide treatment with Floxin 0.3% eardrops, and cetirizine 10 mg for possible left eustachian tube dysfunction.  Supportive care recommendations were provided and discussed with the patient to include fluids, rest, and over-the-counter analgesics.  Patient was advised to follow-up with her PCP if symptoms do not improve with this treatment.  Patient was in agreement with this plan of care and verbalizes understanding.  All questions were answered.  Patient stable for discharge.  Final Clinical Impressions(s) / UC Diagnoses   Final diagnoses:  Left ear pain     Discharge Instructions      Take medication as prescribed. May take over-the-counter Tylenol as needed for pain, fever, or general discomfort. Take medication as prescribed. Warm compresses to the affected ear help with comfort. Do not stick anything inside the ear while symptoms persist. Avoid getting water inside of the ear while symptoms persist. As discussed, if your symptoms do not improve with this treatment, please follow-up with your primary care physician for further evaluation. Follow-up as needed.    ED Prescriptions     Medication Sig Dispense Auth. Provider   ofloxacin (FLOXIN) 0.3 % OTIC solution Place 5 drops into the left ear 2 (two) times daily for 7 days. 5 mL Leath-Warren, Sadie Haber, NP   cetirizine (ZYRTEC) 10 MG tablet Take 1 tablet (10 mg total) by mouth  daily. 30 tablet Leath-Warren, Sadie Haber, NP      PDMP not reviewed this encounter.   Abran Cantor, NP 11/26/22 1636

## 2022-11-26 NOTE — Discharge Instructions (Signed)
Take medication as prescribed. May take over-the-counter Tylenol as needed for pain, fever, or general discomfort. Take medication as prescribed. Warm compresses to the affected ear help with comfort. Do not stick anything inside the ear while symptoms persist. Avoid getting water inside of the ear while symptoms persist. As discussed, if your symptoms do not improve with this treatment, please follow-up with your primary care physician for further evaluation. Follow-up as needed.

## 2022-11-27 ENCOUNTER — Other Ambulatory Visit (HOSPITAL_COMMUNITY): Payer: Self-pay | Admitting: Internal Medicine

## 2022-11-27 DIAGNOSIS — Z1231 Encounter for screening mammogram for malignant neoplasm of breast: Secondary | ICD-10-CM

## 2022-12-11 ENCOUNTER — Ambulatory Visit (HOSPITAL_COMMUNITY)
Admission: RE | Admit: 2022-12-11 | Discharge: 2022-12-11 | Disposition: A | Discharge status: Home / Self Care | Payer: Medicaid Other | Source: Ambulatory Visit | Attending: Internal Medicine | Admitting: Internal Medicine

## 2022-12-11 DIAGNOSIS — Z1231 Encounter for screening mammogram for malignant neoplasm of breast: Secondary | ICD-10-CM | POA: Insufficient documentation

## 2023-01-26 ENCOUNTER — Ambulatory Visit: Payer: Medicaid Other | Admitting: Internal Medicine

## 2023-01-26 ENCOUNTER — Encounter: Payer: Self-pay | Admitting: Internal Medicine

## 2023-01-26 VITALS — BP 117/77 | HR 82 | Ht 64.5 in | Wt 157.6 lb

## 2023-01-26 DIAGNOSIS — E890 Postprocedural hypothyroidism: Secondary | ICD-10-CM

## 2023-01-26 DIAGNOSIS — R7303 Prediabetes: Secondary | ICD-10-CM

## 2023-01-26 DIAGNOSIS — Z720 Tobacco use: Secondary | ICD-10-CM | POA: Diagnosis not present

## 2023-01-26 DIAGNOSIS — E782 Mixed hyperlipidemia: Secondary | ICD-10-CM

## 2023-01-26 NOTE — Assessment & Plan Note (Signed)
She continues to work on smoking cessation.  She reports that a pack of cigarettes currently last 1 month.  She is gradually working towards complete cessation using nicotine lozenges.  She was congratulated on her progress and encouraged to achieve complete cessation.

## 2023-01-26 NOTE — Assessment & Plan Note (Signed)
Closely followed by endocrinology.  Recently seen for follow-up.  She remains on levothyroxine 88 mcg daily.  Thyroid studies within normal limits.

## 2023-01-26 NOTE — Assessment & Plan Note (Signed)
A1c has improved to 5.9 from 6.0 previously.  She continues to focus on lifestyle modifications aimed at weight loss and improving her blood sugar.

## 2023-01-26 NOTE — Patient Instructions (Signed)
It was a pleasure to see you today.  Thank you for giving [place] the opportunity to be involved in your care.  Below is a brief recap of your visit and next steps.  We will plan to see you again in 6 months.  Summary No medication changes today Repeat labs ordered Follow up in 6 months

## 2023-01-26 NOTE — Progress Notes (Signed)
Established Patient Office Visit  Subjective   Patient ID: [name], female    DOB: [birth year]  Age: 63 y.o. MRN: [id number]  Chief Complaint  Patient presents with   Care Management    Six month follow up    [name] returns to care today for routine follow-up.  She was last evaluated by me in July 2024.  No medication changes were made at that time and 21-month follow-up was arranged.  She has been seen by endocrinology in the interim. Urgent care presentation in late November in the setting of otalgia.  There have otherwise been no acute interval events. [name] reports feeling well today.  She is asymptomatic and has no acute concerns to discuss.  She has cut back on smoking and reports that a pack of cigarettes lasts for close to a month.  Past Medical History:  Diagnosis Date   Arthritis    Hypothyroidism    Past Surgical History:  Procedure Laterality Date   APPENDECTOMY     Bilateral bunionectomy     COLONOSCOPY N/A 01/01/2015   Procedure: COLONOSCOPY;  Surgeon: West Bali, MD;  Location: [facility];  Service: Endoscopy;  Laterality: N/A;  11:15 AM - pt knows to arrive at 11:15   LID LESION EXCISION Right 12/29/2020   Procedure: LID LESION EXCISION;  Surgeon: [name], MD;  Location: [facility];  Service: Ophthalmology;  Laterality: Right;  LOCAL   THYROIDECTOMY N/A 08/15/2013   Procedure: TOTAL THYROIDECTOMY;  Surgeon: [name], MD;  Location: [facility];  Service: General;  Laterality: N/A;   Social History   Tobacco Use   Smoking status: Every Day    Current packs/day: 0.10    Average packs/day: 0.1 packs/day for 15.0 years (1.5 ttl pk-yrs)    Types: Cigarettes   Smokeless tobacco: Never  Vaping Use   Vaping status: Never Used  Substance Use Topics   Alcohol use: No   Drug use: No   History reviewed. No pertinent family history. No Known Allergies  Review of Systems  Constitutional:  Negative for chills and fever.   HENT:  Negative for sore throat.   Respiratory:  Negative for cough and shortness of breath.   Cardiovascular:  Negative for chest pain, palpitations and leg swelling.  Gastrointestinal:  Negative for abdominal pain, blood in stool, constipation, diarrhea, nausea and vomiting.  Genitourinary:  Negative for dysuria and hematuria.  Musculoskeletal:  Negative for myalgias.  Skin:  Negative for itching and rash.  Neurological:  Negative for dizziness and headaches.  Psychiatric/Behavioral:  Negative for depression and suicidal ideas.      Objective:     BP 117/77 (BP Location: Right Arm, Patient Position: Sitting, Cuff Size: Normal)   Pulse 82   Ht 5' 4.5" (1.638 m)   Wt 157 lb 9.6 oz (71.5 kg)   LMP 01/11/2013   SpO2 96%   BMI 26.63 kg/m  BP Readings from Last 3 Encounters:  01/26/23 117/77  11/26/22 114/74  10/26/22 118/70   Physical Exam Constitutional:      General: She is not in acute distress.    Appearance: Normal appearance. She is not toxic-appearing.  HENT:     Head: Normocephalic and atraumatic.     Right Ear: External ear normal.     Left Ear: External ear normal.     Nose: Nose normal. No congestion or rhinorrhea.     Mouth/Throat:     Mouth: Mucous membranes are  moist.     Pharynx: Oropharynx is clear. No oropharyngeal exudate or posterior oropharyngeal erythema.  Eyes:     General: No scleral icterus.    Extraocular Movements: Extraocular movements intact.     Conjunctiva/sclera: Conjunctivae normal.     Pupils: Pupils are equal, round, and reactive to light.  Neck:     Comments: Well-healed surgical scar from thyroidectomy on anterior neck Cardiovascular:     Rate and Rhythm: Normal rate and regular rhythm.     Pulses: Normal pulses.     Heart sounds: Normal heart sounds. No murmur heard.    No friction rub. No gallop.  Pulmonary:     Effort: Pulmonary effort is normal.     Breath sounds: Normal breath sounds. No wheezing, rhonchi or rales.   Abdominal:     General: Abdomen is flat. Bowel sounds are normal. There is no distension.     Palpations: Abdomen is soft.     Tenderness: There is no abdominal tenderness.  Musculoskeletal:        General: Normal range of motion.     Cervical back: Normal range of motion. No tenderness.     Right lower leg: No edema.     Left lower leg: No edema.  Lymphadenopathy:     Cervical: No cervical adenopathy.  Skin:    General: Skin is warm and dry.     Capillary Refill: Capillary refill takes less than 2 seconds.     Coloration: Skin is not jaundiced.  Neurological:     General: No focal deficit present.     Mental Status: She is alert and oriented to person, place, and time.     Motor: No weakness.  Psychiatric:        Mood and Affect: Mood normal.        Behavior: Behavior normal.   Last CBC Lab Results  Component Value Date   WBC 8.8 10/11/2021   HGB 15.1 10/11/2021   HCT 45.5 10/11/2021   MCV 89 10/11/2021   MCH 29.7 10/11/2021   RDW 12.7 10/11/2021   PLT 296 10/11/2021   Last metabolic panel Lab Results  Component Value Date   GLUCOSE 94 10/06/2022   NA 142 10/06/2022   K 5.2 10/06/2022   CL 103 10/06/2022   CO2 23 10/06/2022   BUN 13 10/06/2022   CREATININE 0.76 10/06/2022   EGFR 90 10/06/2022   CALCIUM 9.7 10/06/2022   PROT 7.0 10/06/2022   ALBUMIN 4.8 10/06/2022   LABGLOB 2.2 10/06/2022   AGRATIO 2.1 10/11/2021   BILITOT 0.3 10/06/2022   ALKPHOS 68 10/06/2022   AST 19 10/06/2022   ALT 26 10/06/2022   ANIONGAP 10 08/16/2013   Last lipids Lab Results  Component Value Date   CHOL 168 10/11/2021   HDL 41 10/11/2021   LDLCALC 82 10/11/2021   TRIG 274 (H) 10/11/2021   CHOLHDL 4.1 10/11/2021   Last hemoglobin A1c Lab Results  Component Value Date   HGBA1C 5.9 (H) 10/06/2022   Last thyroid functions Lab Results  Component Value Date   TSH 3.210 10/06/2022   Last vitamin D Lab Results  Component Value Date   VD25OH 94.0 10/06/2022   The  10-year ASCVD risk score (Arnett DK, et al., 2019) is: 6.2%    Assessment & Plan:   Problem List Items Addressed This Visit       Postsurgical hypothyroidism   Closely followed by endocrinology.  Recently seen for follow-up.  She remains on levothyroxine  88 mcg daily.  Thyroid studies within normal limits.      Hyperlipidemia - Primary   Currently prescribed rosuvastatin 20 mg daily.  Repeat lipid panel ordered today.      Prediabetes   A1c has improved to 5.9 from 6.0 previously.  She continues to focus on lifestyle modifications aimed at weight loss and improving her blood sugar.      Tobacco use   She continues to work on smoking cessation.  She reports that a pack of cigarettes currently last 1 month.  She is gradually working towards complete cessation using nicotine lozenges.  She was congratulated on her progress and encouraged to achieve complete cessation.      Return in about 6 months (around 07/26/2023).   Billie Lade, MD

## 2023-01-26 NOTE — Assessment & Plan Note (Signed)
Currently prescribed rosuvastatin 20 mg daily. -Repeat lipid panel ordered today

## 2023-01-27 LAB — CBC WITH DIFFERENTIAL/PLATELET
Basophils Absolute: 0.1 10*3/uL (ref 0.0–0.2)
Basos: 1 %
EOS (ABSOLUTE): 0.5 10*3/uL — ABNORMAL HIGH (ref 0.0–0.4)
Eos: 4 %
Hematocrit: 46.5 % (ref 34.0–46.6)
Hemoglobin: 15.1 g/dL (ref 11.1–15.9)
Immature Grans (Abs): 0 10*3/uL (ref 0.0–0.1)
Immature Granulocytes: 0 %
Lymphocytes Absolute: 3.9 10*3/uL — ABNORMAL HIGH (ref 0.7–3.1)
Lymphs: 37 %
MCH: 29.2 pg (ref 26.6–33.0)
MCHC: 32.5 g/dL (ref 31.5–35.7)
MCV: 90 fL (ref 79–97)
Monocytes Absolute: 1.1 10*3/uL — ABNORMAL HIGH (ref 0.1–0.9)
Monocytes: 10 %
Neutrophils Absolute: 5.1 10*3/uL (ref 1.4–7.0)
Neutrophils: 48 %
Platelets: 334 10*3/uL (ref 150–450)
RBC: 5.17 x10E6/uL (ref 3.77–5.28)
RDW: 12.1 % (ref 11.7–15.4)
WBC: 10.6 10*3/uL (ref 3.4–10.8)

## 2023-01-27 LAB — LIPID PANEL
Chol/HDL Ratio: 3.6 {ratio} (ref 0.0–4.4)
Cholesterol, Total: 158 mg/dL (ref 100–199)
HDL: 44 mg/dL (ref >39)
LDL Chol Calc (NIH): 73 mg/dL (ref 0–99)
Triglycerides: 255 mg/dL — ABNORMAL HIGH (ref 0–149)
VLDL Cholesterol Cal: 41 mg/dL — ABNORMAL HIGH (ref 5–40)

## 2023-01-28 ENCOUNTER — Emergency Department (HOSPITAL_COMMUNITY): Payer: Medicaid Other

## 2023-01-28 ENCOUNTER — Other Ambulatory Visit: Payer: Self-pay

## 2023-01-28 ENCOUNTER — Encounter (HOSPITAL_COMMUNITY): Payer: Self-pay

## 2023-01-28 ENCOUNTER — Emergency Department (HOSPITAL_COMMUNITY)
Admission: EM | Admit: 2023-01-28 | Discharge: 2023-01-28 | Disposition: A | Discharge status: Home / Self Care | Payer: Medicaid Other

## 2023-01-28 DIAGNOSIS — D72829 Elevated white blood cell count, unspecified: Secondary | ICD-10-CM | POA: Diagnosis not present

## 2023-01-28 DIAGNOSIS — M542 Cervicalgia: Secondary | ICD-10-CM | POA: Diagnosis not present

## 2023-01-28 DIAGNOSIS — R5381 Other malaise: Secondary | ICD-10-CM | POA: Diagnosis not present

## 2023-01-28 DIAGNOSIS — R9431 Abnormal electrocardiogram [ECG] [EKG]: Secondary | ICD-10-CM | POA: Diagnosis not present

## 2023-01-28 LAB — BASIC METABOLIC PANEL
Anion gap: 8 (ref 5–15)
BUN: 13 mg/dL (ref 6–20)
CO2: 26 mmol/L (ref 22–32)
Calcium: 9.5 mg/dL (ref 8.9–10.3)
Chloride: 105 mmol/L (ref 98–111)
Creatinine, Ser: 0.59 mg/dL (ref 0.44–1.00)
GFR, Estimated: >60 mL/min (ref >60)
Glucose, Bld: 120 mg/dL — ABNORMAL HIGH (ref 70–99)
Potassium: 4.7 mmol/L (ref 3.5–5.1)
Sodium: 139 mmol/L (ref 135–145)

## 2023-01-28 LAB — CBC
HCT: 44.3 % (ref 36.0–46.0)
Hemoglobin: 15 g/dL (ref 12.0–15.0)
MCH: 30.2 pg (ref 26.0–34.0)
MCHC: 33.9 g/dL (ref 30.0–36.0)
MCV: 89.3 fL (ref 80.0–100.0)
Platelets: 307 10*3/uL (ref 150–400)
RBC: 4.96 MIL/uL (ref 3.87–5.11)
RDW: 12.7 % (ref 11.5–15.5)
WBC: 13.3 10*3/uL — ABNORMAL HIGH (ref 4.0–10.5)
nRBC: 0 % (ref 0.0–0.2)

## 2023-01-28 LAB — TROPONIN I (HIGH SENSITIVITY): Troponin I (High Sensitivity): 2 ng/L (ref <18)

## 2023-01-28 NOTE — ED Triage Notes (Signed)
Pt is c/o left sided neck pain with minimal swelling. Pt states this started last night.

## 2023-01-28 NOTE — Discharge Instructions (Addendum)
Pleasure taking care of you today.  You are seen for pain and some swelling in the left jaw area and intake for neck.  Your exam was reassuring, you had a slight increase in your white blood cell count but otherwise your labs were also reassuring, you may have a blockage of the salivary gland such as a stone, drink plenty of water, you can suck on sour candies such as lemon drops which can increase your saliva production and relieve the dry mouth and some of the swelling, you can massage the area and apply warm compresses.  Follow-up with ENT, if you have worsening symptoms such as fever, swelling, double swallowing or breathing or any other worrisome changes come back to the ER right away.  Can take over-the-counter Tylenol and ibuprofen as needed for discomfort as directed on packaging

## 2023-01-28 NOTE — ED Provider Notes (Signed)
[place] EMERGENCY DEPARTMENT AT [facility] Provider Note   CSN: [id number] Arrival date & time: 01/28/23  4098     History  Chief Complaint  Patient presents with   Neck Pain    [name] is a 63 y.o. female.  He has PMH of thyroidectomy, maintained on Synthroid.  Presents the ER for evaluation of left-sided neck pain and feeling of puffiness on neck.  This started today morning, she states she thought she had a "crick" in her neck from sleeping wrong.  Pain to get somewhat better throughout the day but she states she had some episodes of feeling "woozy", do not feel he should pass out, denies vertigo, no headache or blurry vision, no chest pain or shortness of breath, no noticed leg weakness.  States she just felt funny and like she needed to sit down.  She noticed last night that the pain came back and she seemed like she had some swelling in the area behind her left jaw.  She also notes she feels like her mouth is dry, and had a burning, "sour" sensation behind her left jaw when eating last night.  States she is in soreness again this morning that resolved with over-the-counter Tylenol.   Neck Pain Associated symptoms: no fever, no numbness and no weakness        Home Medications Prior to Admission medications   Medication Sig Start Date End Date Taking? Authorizing Provider  cetirizine (ZYRTEC) 10 MG tablet Take 1 tablet (10 mg total) by mouth daily. 11/26/22   Leath-Warren, Sadie Haber, NP  levothyroxine (SYNTHROID) 88 MCG tablet Take 1 tablet (88 mcg total) by mouth daily before breakfast. 10/26/22   Dani Gobble, NP  Multiple Vitamin (MULTIVITAMIN) tablet Take 1 tablet by mouth daily.    [provider]  nicotine polacrilex (NICOTINE MINI) 2 MG lozenge Take 1 lozenge (2 mg total) by mouth as needed for smoking cessation. 01/13/22   Billie Lade, MD  rosuvastatin (CRESTOR) 20 MG tablet TAKE ONE TABLET BY MOUTH ONCE DAILY. 11/22/22   Billie Lade, MD      Allergies    Patient has no known allergies.    Review of Systems   Review of Systems  Constitutional:  Negative for chills and fever.  HENT:  Negative for congestion, hearing loss, mouth sores, sore throat, trouble swallowing and voice change.   Gastrointestinal:  Negative for nausea and vomiting.  Musculoskeletal:  Positive for neck pain. Negative for neck stiffness.  Neurological:  Negative for syncope, facial asymmetry, speech difficulty, weakness and numbness.    Physical Exam Updated Vital Signs BP 116/74   Pulse 66   Temp 98 F (36.7 C)   Resp 14   Ht 5\' 4"  (1.626 m)   Wt 71.2 kg   LMP 01/11/2013   SpO2 97%   BMI 26.94 kg/m  Physical Exam Vitals and nursing note reviewed.  Constitutional:      General: She is not in acute distress.    Appearance: She is well-developed.  HENT:     Head: Normocephalic and atraumatic.     Right Ear: Tympanic membrane normal.     Left Ear: Tympanic membrane normal.     Mouth/Throat:     Mouth: Mucous membranes are moist.     Comments: Posterior pharynx is normal, no intraoral swelling, no trismus, patient speaks in full and clear sentences, she handles oral secretions well.  There is no dental tenderness on exam,  no abscess noted. Eyes:     General: Lids are normal. No visual field deficit.    Extraocular Movements: Extraocular movements intact.     Conjunctiva/sclera: Conjunctivae normal.     Pupils: Pupils are equal, round, and reactive to light.     Visual Fields: Right eye visual fields normal and left eye visual fields normal.  Cardiovascular:     Rate and Rhythm: Normal rate and regular rhythm.     Heart sounds: No murmur heard. Pulmonary:     Effort: Pulmonary effort is normal. No respiratory distress.     Breath sounds: Normal breath sounds.  Abdominal:     Palpations: Abdomen is soft.     Tenderness: There is no abdominal tenderness.  Musculoskeletal:        General: No swelling.     Cervical  back: Normal range of motion and neck supple. No rigidity or tenderness.  Skin:    General: Skin is warm and dry.     Capillary Refill: Capillary refill takes less than 2 seconds.  Neurological:     General: No focal deficit present.     Mental Status: She is alert and oriented to person, place, and time.     GCS: GCS eye subscore is 4. GCS verbal subscore is 5. GCS motor subscore is 6.     Cranial Nerves: Cranial nerves 2-12 are intact. No dysarthria.     Motor: No weakness or abnormal muscle tone.     Coordination: Coordination is intact.  Psychiatric:        Mood and Affect: Mood normal.     ED Results / Procedures / Treatments   Labs (all labs ordered are listed, but only abnormal results are displayed) Labs Reviewed  BASIC METABOLIC PANEL - Abnormal; Notable for the following components:      Result Value   Glucose, Bld 120 (*)    All other components within normal limits  CBC - Abnormal; Notable for the following components:   WBC 13.3 (*)    All other components within normal limits  TROPONIN I (HIGH SENSITIVITY)    EKG EKG Interpretation Date/Time:  Sunday January 28 2023 10:49:36 EST Ventricular Rate:  72 PR Interval:  166 QRS Duration:  80 QT Interval:  379 QTC Calculation: 415 R Axis:   19  Text Interpretation: Sinus rhythm Low voltage, precordial leads Confirmed by Estanislado Pandy [phone]) on 01/28/2023 11:34:52 AM  Radiology DG Chest Port 1 View Result Date: 01/28/2023 CLINICAL DATA:  malaise. EXAM: PORTABLE CHEST 1 VIEW COMPARISON:  05/07/2007. FINDINGS: Bilateral lung fields are clear. Bilateral costophrenic angles are clear. Normal cardio-mediastinal silhouette. No acute osseous abnormalities. The soft tissues are within normal limits. IMPRESSION: No active disease. Electronically Signed   By: Jules Schick M.D.   On: 01/28/2023 11:45    Procedures Procedures    Medications Ordered in ED Medications - No data to display  ED Course/ Medical Decision  Making/ A&P                                 Medical Decision Making This patient presents to the ED for concern of left neck pain and pain behind the left jaw, this involves an extensive number of treatment options, and is a complaint that carries with it a high risk of complications and morbidity.  The differential diagnosis includes strain, contusion, sialolithiasis, left eye abnormality, near syncope, dehydration,   Co  morbidities that complicate the patient evaluation hypothyroidism   Additional history obtained:  Additional history obtained from EMR External records from outside source obtained and reviewed including prior notes, labs   Lab Tests:  I Ordered, and personally interpreted labs.  The pertinent results include: Troponin is normal, only 1 ordered as symptoms have been going on for over 12 hours, BMP normal, CBC shows mild leukocytosis otherwise normal   Imaging Studies ordered:  I ordered imaging studies including x-ray I independently visualized and interpreted imaging which showed pulmonary edema or infiltrate I agree with the radiologist interpretation     Problem List / ED Course / Critical interventions / Medication management  Pain behind the left mandible, dry mouth, also complaining of some "wooziness". Patient has a very minimal swelling and tenderness in the affected area, no redness or warmth.  No fevers or chills.  Given the dry mouth and increased symptoms with eating I feel she may have sialolithiasis.  She has a mild leukocytosis but labs are otherwise normal and she has no systemic consultants of illness.  Discussed risk and benefits imaging with her at this time, however do not feel she would benefit from imaging at this time, will proceed with the conservative care including oral hydration and sialagogues.  Advised on ENT follow-up.  She was given strict return precautions, severely if she has swelling, fever, trouble swallowing or breathing or  other new or worsening symptoms.  She is encouraged take over-the-counter medication such as Tylenol Motrin as needed for discomfort.  Guarding her complaint of "wooziness" she has no neurologic symptoms, no headache, no numbness tingling or weakness, normal neuroexam, baseline labs are normal, EKG and troponin are negative, chest x-ray is normal, she is not orthostatic and not having symptoms at this time, advised on PCP follow-up and return precautions for this as well.  She did not have syncope or near syncope.  Not having vertigo.  Do not feel she needs any neuroimaging.  Discussed with Dr. Maple Hudson while patient was in the ED  I have reviewed the patients home medicines and have made adjustments as needed        Amount and/or Complexity of Data Reviewed Labs: ordered. Radiology: ordered.           Final Clinical Impression(s) / ED Diagnoses Final diagnoses:  Neck pain    Rx / DC Orders ED Discharge Orders     None         Ma Rings, PA-C 01/28/23 1306    Coral Spikes, DO 01/28/23 1553

## 2023-01-31 ENCOUNTER — Ambulatory Visit: Payer: Medicaid Other | Admitting: Internal Medicine

## 2023-01-31 ENCOUNTER — Encounter: Payer: Self-pay | Admitting: Internal Medicine

## 2023-01-31 ENCOUNTER — Ambulatory Visit: Payer: Self-pay | Admitting: Internal Medicine

## 2023-01-31 VITALS — BP 128/84 | HR 80 | Ht 64.5 in | Wt 156.2 lb

## 2023-01-31 DIAGNOSIS — R202 Paresthesia of skin: Secondary | ICD-10-CM | POA: Insufficient documentation

## 2023-01-31 DIAGNOSIS — M542 Cervicalgia: Secondary | ICD-10-CM | POA: Insufficient documentation

## 2023-01-31 DIAGNOSIS — Z09 Encounter for follow-up examination after completed treatment for conditions other than malignant neoplasm: Secondary | ICD-10-CM | POA: Insufficient documentation

## 2023-01-31 MED ORDER — PREDNISONE 10 MG (21) PO TBPK: ORAL_TABLET | ORAL | 0 refills | Status: DC

## 2023-01-31 NOTE — Progress Notes (Signed)
Established Patient Office Visit  Subjective:  Patient ID: [name], female    DOB: [birth year]  Age: 63 y.o. MRN: [id number]  CC:  Chief Complaint  Patient presents with   Tingling    Pt reports generalized tingling. Pt reports seeing ED on 01/28/23, reports sx still coming and going.     HPI [name] is a 63 y.o. female with past medical history of hypothyroidism and HLD who presents for f/u of recent ER visit on 01/28/23.  She complained of acute onset left-sided neck pain since waking up on 01/27/23.  She has been having recurrent tingling of the bilateral arms.  She also reports electric current like sensation passing through her body starting from neck down to the lower extremities.  During ER visit, she also reported left-sided jaw discomfort, and was told of salivary gland stone, has been referred to ENT specialist from ER.  Today, she reports persistent left-sided neck discomfort, which is constant, dull, and worse with lateral flexion of the neck.  She still has tingling sensation of the upper extremities, which is intermittent.  Denies any jaw discomfort currently.  Denies pain while chewing.  Denies dysphagia or odynophagia.  Denies any visual disturbance.  Past Medical History:  Diagnosis Date   Arthritis    Hypothyroidism     Past Surgical History:  Procedure Laterality Date   APPENDECTOMY     Bilateral bunionectomy     COLONOSCOPY N/A 01/01/2015   Procedure: COLONOSCOPY;  Surgeon: West Bali, MD;  Location: [facility];  Service: Endoscopy;  Laterality: N/A;  11:15 AM - pt knows to arrive at 11:15   LID LESION EXCISION Right 12/29/2020   Procedure: LID LESION EXCISION;  Surgeon: [name], MD;  Location: [facility];  Service: Ophthalmology;  Laterality: Right;  LOCAL   THYROIDECTOMY N/A 08/15/2013   Procedure: TOTAL THYROIDECTOMY;  Surgeon: [name], MD;  Location: [facility];  Service: General;  Laterality: N/A;    History  reviewed. No pertinent family history.  Social History   Socioeconomic History   Marital status: Married    Spouse name: Not on file   Number of children: Not on file   Years of education: Not on file   Highest education level: Not on file  Occupational History   Not on file  Tobacco Use   Smoking status: Every Day    Current packs/day: 0.10    Average packs/day: 0.1 packs/day for 15.0 years (1.5 ttl pk-yrs)    Types: Cigarettes   Smokeless tobacco: Never  Vaping Use   Vaping status: Never Used  Substance and Sexual Activity   Alcohol use: No   Drug use: No   Sexual activity: Yes    Birth control/protection: None  Other Topics Concern   Not on file  Social History Narrative   Not on file   Social Drivers of Health   Financial Resource Strain: Low Risk  (05/06/2020)   Overall Financial Resource Strain (CARDIA)    Difficulty of Paying Living Expenses: Not hard at all  Food Insecurity: No Food Insecurity (05/06/2020)   Hunger Vital Sign    Worried About Running Out of Food in the Last Year: Never true    Ran Out of Food in the Last Year: Never true  Transportation Needs: No Transportation Needs (05/06/2020)   PRAPARE - Administrator, Civil Service (Medical): No    Lack of Transportation (Non-Medical): No  Physical Activity: Sufficiently  Active (05/06/2020)   Exercise Vital Sign    Days of Exercise per Week: 5 days    Minutes of Exercise per Session: 30 min  Stress: No Stress Concern Present (05/06/2020)   [employer] of Occupational Health - Occupational Stress Questionnaire    Feeling of Stress : Not at all  Social Connections: Moderately Isolated (05/06/2020)   Social Connection and Isolation Panel [NHANES]    Frequency of Communication with Friends and Family: More than three times a week    Frequency of Social Gatherings with Friends and Family: More than three times a week    Attends Religious Services: Never    Database administrator or Organizations:  No    Attends Banker Meetings: Never    Marital Status: Living with partner  Intimate Partner Violence: Not At Risk (05/06/2020)   Humiliation, Afraid, Rape, and Kick questionnaire    Fear of Current or Ex-Partner: No    Emotionally Abused: No    Physically Abused: No    Sexually Abused: No    Outpatient Medications Prior to Visit  Medication Sig Dispense Refill   cetirizine (ZYRTEC) 10 MG tablet Take 1 tablet (10 mg total) by mouth daily. 30 tablet 0   levothyroxine (SYNTHROID) 88 MCG tablet Take 1 tablet (88 mcg total) by mouth daily before breakfast. 90 tablet 3   Multiple Vitamin (MULTIVITAMIN) tablet Take 1 tablet by mouth daily.     nicotine polacrilex (NICOTINE MINI) 2 MG lozenge Take 1 lozenge (2 mg total) by mouth as needed for smoking cessation. 100 tablet 0   rosuvastatin (CRESTOR) 20 MG tablet TAKE ONE TABLET BY MOUTH ONCE DAILY. 90 tablet 1   No facility-administered medications prior to visit.    No Known Allergies  ROS Review of Systems  Constitutional:  Negative for chills and fever.  HENT:  Negative for congestion, sinus pressure, sinus pain and sore throat.   Eyes:  Negative for pain and discharge.  Respiratory:  Negative for cough and shortness of breath.   Cardiovascular:  Negative for chest pain and palpitations.  Gastrointestinal:  Negative for abdominal pain, diarrhea, nausea and vomiting.  Endocrine: Negative for polydipsia and polyuria.  Genitourinary:  Negative for dysuria and hematuria.  Musculoskeletal:  Positive for neck pain. Negative for neck stiffness.  Skin:  Negative for rash.  Neurological:  Negative for dizziness and weakness.       Tingling of bilateral UE  Psychiatric/Behavioral:  Negative for agitation and behavioral problems.       Objective:    Physical Exam Vitals reviewed.  Constitutional:      General: She is not in acute distress.    Appearance: She is not diaphoretic.  HENT:     Head: Normocephalic and  atraumatic.     Nose: Nose normal. No congestion.     Mouth/Throat:     Mouth: Mucous membranes are moist.     Pharynx: No posterior oropharyngeal erythema.  Eyes:     General: No scleral icterus.    Extraocular Movements: Extraocular movements intact.  Cardiovascular:     Rate and Rhythm: Normal rate and regular rhythm.     Heart sounds: Normal heart sounds. No murmur heard. Pulmonary:     Breath sounds: Normal breath sounds. No wheezing or rales.  Musculoskeletal:     Cervical back: Neck supple. No tenderness.     Right lower leg: No edema.     Left lower leg: No edema.  Skin:  General: Skin is warm.     Findings: No rash.  Neurological:     General: No focal deficit present.     Mental Status: She is alert and oriented to person, place, and time.     Cranial Nerves: No cranial nerve deficit.     Sensory: No sensory deficit.     Motor: No weakness.  Psychiatric:        Mood and Affect: Mood normal.        Behavior: Behavior normal.     BP 128/84   Pulse 80   Ht 5' 4.5" (1.638 m)   Wt 156 lb 3.2 oz (70.9 kg)   LMP 01/11/2013   SpO2 97%   BMI 26.40 kg/m  Wt Readings from Last 3 Encounters:  01/31/23 156 lb 3.2 oz (70.9 kg)  01/28/23 156 lb 15.5 oz (71.2 kg)  01/26/23 157 lb 9.6 oz (71.5 kg)    Lab Results  Component Value Date   TSH 3.210 10/06/2022   Lab Results  Component Value Date   WBC 13.3 (H) 01/28/2023   HGB 15.0 01/28/2023   HCT 44.3 01/28/2023   MCV 89.3 01/28/2023   PLT 307 01/28/2023   Lab Results  Component Value Date   NA 139 01/28/2023   K 4.7 01/28/2023   CO2 26 01/28/2023   GLUCOSE 120 (H) 01/28/2023   BUN 13 01/28/2023   CREATININE 0.59 01/28/2023   BILITOT 0.3 10/06/2022   ALKPHOS 68 10/06/2022   AST 19 10/06/2022   ALT 26 10/06/2022   PROT 7.0 10/06/2022   ALBUMIN 4.8 10/06/2022   CALCIUM 9.5 01/28/2023   ANIONGAP 8 01/28/2023   EGFR 90 10/06/2022   Lab Results  Component Value Date   CHOL 158 01/26/2023   Lab  Results  Component Value Date   HDL 44 01/26/2023   Lab Results  Component Value Date   LDLCALC 73 01/26/2023   Lab Results  Component Value Date   TRIG 255 (H) 01/26/2023   Lab Results  Component Value Date   CHOLHDL 3.6 01/26/2023   Lab Results  Component Value Date   HGBA1C 5.9 (H) 10/06/2022      Assessment & Plan:   Problem List Items Addressed This Visit       Other   Cervical pain (neck)   Pain likely due to muscular strain Started Sterapred taper Advised to apply warm compresses If persistent, advised to get x-ray of cervical spine      Relevant Medications   predniSONE (STERAPRED UNI-PAK 21 TAB) 10 MG (21) TBPK tablet   Other Relevant Orders   DG Cervical Spine Complete   Tingling in extremities   Considering her presentation and normal neurologic exam, unlikely to be CVA Tingling in upper extremities could be due to cervical radiculopathy If persistent, will need imaging of cervical spine      Encounter for examination following treatment at hospital - Primary   ER chart reviewed, including imaging and blood tests Neck pain and tingling of UE likely due to muscular strain and/or cervical radiculopathy Sour sensation in the jaw could be related to sialadenitis -planned to see ENT specialist       Meds ordered this encounter  Medications   predniSONE (STERAPRED UNI-PAK 21 TAB) 10 MG (21) TBPK tablet    Sig: Take as package instructions.    Dispense:  1 each    Refill:  0    Follow-up: Return if symptoms worsen or fail to improve.  Anabel Halon, MD

## 2023-01-31 NOTE — Assessment & Plan Note (Signed)
Considering her presentation and normal neurologic exam, unlikely to be CVA Tingling in upper extremities could be due to cervical radiculopathy If persistent, will need imaging of cervical spine

## 2023-01-31 NOTE — Telephone Encounter (Deleted)
Reason for Disposition . [1] Numbness (i.e., loss of sensation) of the face, arm / hand, or leg / foot on one side of the body AND [2] gradual onset (e.g., days to weeks) AND [3] present now  Protocols used: Neurologic Deficit-A-AH

## 2023-01-31 NOTE — Patient Instructions (Signed)
Please start taking Prednisone as prescribed.  Please apply warm compresses and okay to perform massages around left side of neck towards shoulder area as discussed.

## 2023-01-31 NOTE — Telephone Encounter (Addendum)
  Chief Complaint: generalized tingling Symptoms: slight swelling of L neck/face, poor appetite Frequency: Sunday Pertinent Negatives: Patient denies  unilateral weakness, facial drooping, Chest pain, difficulty breathing Disposition: [] ED /[] Urgent Care (no appt availability in office) / [x] Appointment(In office/virtual)/ []  [facility]/ [] Home Care/ [] Refused Recommended Disposition /[] [facility]/ []  Follow-up with PCP Additional Notes: Patient c/o generalized tingling that started Sunday. Reports went to ED to be evaluated and ED MD reports she had salivary gland stones causing L neck/face swelling. Patient denies chest pain, SOB, unilateral weakness, facial drooping, H/A. Endorses poor appetite and "feeling funny". Scheduled patient per protocol on 01/31/2023. Patient verbalized understanding and to call back with worsening symptoms.    Copied from CRM [id number]. Topic: Clinical - Red Word Triage >> Jan 31, 2023 10:19 AM Gaetano Hawthorne wrote: [facility] that prompted transfer to Nurse Triage: Patient describe a strange feeling/sensation that comes all over her body from head to toe. She says she is not feeling like herself and it's hard for her to describe.  Hx of salivary gland stones Reason for Disposition  [1] Weakness of the face, arm / hand, or leg / foot on one side of the body AND [2] gradual onset (e.g., days to weeks) AND [3] present now  Answer Assessment - Initial Assessment Questions 1. SYMPTOM: "What is the main symptom you are concerned about?" (e.g., weakness, numbness)     Tingling all over body L side neck/face swelling - resolving 2. ONSET: "When did this start?" (minutes, hours, days; while sleeping)     Sunday, before I went ER - reports MD the swelling in L neck was r/t salivary gland stones 3. LAST NORMAL: "When was the last time you (the patient) were normal (no symptoms)?"     Saturday 4. PATTERN "Does this come and go, or has it been constant since  it started?"  "Is it present now?"     Comes and goes - lasts a few seconds 5. CARDIAC SYMPTOMS: "Have you had any of the following symptoms: chest pain, difficulty breathing, palpitations?"     no 6. NEUROLOGIC SYMPTOMS: "Have you had any of the following symptoms: headache, dizziness, vision loss, double vision, changes in speech, unsteady on your feet?"     Denies trouble eating, but poor appetite Able to walk Denies unilateral weakness, drooping Denies Chest pain, difficulty breathing Denies facial droop 7. OTHER SYMPTOMS: "Do you have any other symptoms?"     no  Protocols used: Neurologic Deficit-A-AH

## 2023-01-31 NOTE — Assessment & Plan Note (Signed)
Pain likely due to muscular strain Started Sterapred taper Advised to apply warm compresses If persistent, advised to get x-ray of cervical spine

## 2023-01-31 NOTE — Assessment & Plan Note (Signed)
ER chart reviewed, including imaging and blood tests Neck pain and tingling of UE likely due to muscular strain and/or cervical radiculopathy Sour sensation in the jaw could be related to sialadenitis -planned to see ENT specialist

## 2023-02-02 ENCOUNTER — Ambulatory Visit (HOSPITAL_COMMUNITY)
Admission: RE | Admit: 2023-02-02 | Discharge: 2023-02-02 | Disposition: A | Discharge status: Home / Self Care | Payer: Medicaid Other | Source: Ambulatory Visit | Attending: Internal Medicine | Admitting: Internal Medicine

## 2023-02-02 ENCOUNTER — Telehealth: Payer: Self-pay

## 2023-02-02 DIAGNOSIS — M50223 Other cervical disc displacement at C6-C7 level: Secondary | ICD-10-CM | POA: Diagnosis not present

## 2023-02-02 DIAGNOSIS — M542 Cervicalgia: Secondary | ICD-10-CM | POA: Insufficient documentation

## 2023-02-02 DIAGNOSIS — M26622 Arthralgia of left temporomandibular joint: Secondary | ICD-10-CM | POA: Diagnosis not present

## 2023-02-02 DIAGNOSIS — M8580 Other specified disorders of bone density and structure, unspecified site: Secondary | ICD-10-CM | POA: Diagnosis not present

## 2023-02-02 DIAGNOSIS — M47812 Spondylosis without myelopathy or radiculopathy, cervical region: Secondary | ICD-10-CM | POA: Diagnosis not present

## 2023-02-02 DIAGNOSIS — M26629 Arthralgia of temporomandibular joint, unspecified side: Secondary | ICD-10-CM | POA: Insufficient documentation

## 2023-02-02 NOTE — Telephone Encounter (Signed)
Copied from CRM [id number]. Topic: Clinical - Medical Advice >> Feb 02, 2023  8:23 AM Nada Libman H wrote: Reason for CRM: Patient was seen on 1/29 about neck and she is stating that sensations is coming and going/ Also her stomach has been feeling funny since taking the medication//Please call to advise//

## 2023-02-02 NOTE — Telephone Encounter (Signed)
 [email or web address]

## 2023-02-02 NOTE — Telephone Encounter (Signed)
 Patient advised.

## 2023-02-03 ENCOUNTER — Emergency Department (HOSPITAL_COMMUNITY)
Admission: EM | Admit: 2023-02-03 | Discharge: 2023-02-03 | Disposition: A | Discharge status: Home / Self Care | Payer: Medicaid Other | Attending: Emergency Medicine | Admitting: Emergency Medicine

## 2023-02-03 DIAGNOSIS — R739 Hyperglycemia, unspecified: Secondary | ICD-10-CM | POA: Insufficient documentation

## 2023-02-03 DIAGNOSIS — R202 Paresthesia of skin: Secondary | ICD-10-CM | POA: Diagnosis not present

## 2023-02-03 DIAGNOSIS — Z79899 Other long term (current) drug therapy: Secondary | ICD-10-CM | POA: Insufficient documentation

## 2023-02-03 DIAGNOSIS — R110 Nausea: Secondary | ICD-10-CM | POA: Diagnosis present

## 2023-02-03 LAB — URINALYSIS, ROUTINE W REFLEX MICROSCOPIC
Bilirubin Urine: NEGATIVE
Glucose, UA: NEGATIVE mg/dL
Hgb urine dipstick: NEGATIVE
Ketones, ur: NEGATIVE mg/dL
Nitrite: NEGATIVE
Protein, ur: NEGATIVE mg/dL
Specific Gravity, Urine: 1.011 (ref 1.005–1.030)
pH: 7 (ref 5.0–8.0)

## 2023-02-03 LAB — CBC WITH DIFFERENTIAL/PLATELET
Abs Immature Granulocytes: 0.07 10*3/uL (ref 0.00–0.07)
Basophils Absolute: 0.1 10*3/uL (ref 0.0–0.1)
Basophils Relative: 1 %
Eosinophils Absolute: 0.4 10*3/uL (ref 0.0–0.5)
Eosinophils Relative: 3 %
HCT: 47.8 % — ABNORMAL HIGH (ref 36.0–46.0)
Hemoglobin: 15.9 g/dL — ABNORMAL HIGH (ref 12.0–15.0)
Immature Granulocytes: 1 %
Lymphocytes Relative: 25 %
Lymphs Abs: 3.5 10*3/uL (ref 0.7–4.0)
MCH: 29.8 pg (ref 26.0–34.0)
MCHC: 33.3 g/dL (ref 30.0–36.0)
MCV: 89.7 fL (ref 80.0–100.0)
Monocytes Absolute: 1.2 10*3/uL — ABNORMAL HIGH (ref 0.1–1.0)
Monocytes Relative: 8 %
Neutro Abs: 9 10*3/uL — ABNORMAL HIGH (ref 1.7–7.7)
Neutrophils Relative %: 62 %
Platelets: 350 10*3/uL (ref 150–400)
RBC: 5.33 MIL/uL — ABNORMAL HIGH (ref 3.87–5.11)
RDW: 12.7 % (ref 11.5–15.5)
WBC: 14.2 10*3/uL — ABNORMAL HIGH (ref 4.0–10.5)
nRBC: 0 % (ref 0.0–0.2)

## 2023-02-03 LAB — COMPREHENSIVE METABOLIC PANEL
ALT: 24 U/L (ref 0–44)
AST: 17 U/L (ref 15–41)
Albumin: 4.5 g/dL (ref 3.5–5.0)
Alkaline Phosphatase: 50 U/L (ref 38–126)
Anion gap: 8 (ref 5–15)
BUN: 18 mg/dL (ref 6–20)
CO2: 26 mmol/L (ref 22–32)
Calcium: 9.3 mg/dL (ref 8.9–10.3)
Chloride: 105 mmol/L (ref 98–111)
Creatinine, Ser: 0.65 mg/dL (ref 0.44–1.00)
GFR, Estimated: >60 mL/min (ref >60)
Glucose, Bld: 108 mg/dL — ABNORMAL HIGH (ref 70–99)
Potassium: 4.7 mmol/L (ref 3.5–5.1)
Sodium: 139 mmol/L (ref 135–145)
Total Bilirubin: 0.6 mg/dL (ref 0.0–1.2)
Total Protein: 7.6 g/dL (ref 6.5–8.1)

## 2023-02-03 LAB — T4, FREE: Free T4: 1.17 ng/dL — ABNORMAL HIGH (ref 0.61–1.12)

## 2023-02-03 LAB — TSH: TSH: 5.348 u[IU]/mL — ABNORMAL HIGH (ref 0.350–4.500)

## 2023-02-03 MED ORDER — LORAZEPAM 2 MG/ML IJ SOLN
0.5000 mg | Freq: Once | INTRAMUSCULAR | Status: AC
  Administered 2023-02-03: 0.5 mg via INTRAVENOUS
  Filled 2023-02-03: qty 1

## 2023-02-03 MED ORDER — LORAZEPAM 1 MG PO TABS: 0.5000 mg | ORAL_TABLET | Freq: Three times a day (TID) | ORAL | 0 refills | Status: DC | PRN

## 2023-02-03 MED ORDER — LACTATED RINGERS IV BOLUS
500.0000 mL | Freq: Once | INTRAVENOUS | Status: AC
  Administered 2023-02-03: 500 mL via INTRAVENOUS

## 2023-02-03 NOTE — ED Triage Notes (Signed)
Pt c/o nausea that started upon waking this morning. Also recently evaluated for L sided neck/facial pain and was diagnosed with salivary stone. Pt states she was seen outpatient twice in the last week for the continued pressure sensation in her neck with unclear diagnosis.

## 2023-02-03 NOTE — ED Provider Notes (Incomplete)
[place] EMERGENCY DEPARTMENT AT [facility] Provider Note   CSN: [id number] Arrival date & time: 02/03/23  0825     History {Add pertinent medical, surgical, social history, OB history to HPI:1} Chief Complaint  Patient presents with   Nausea   Neck Pain    [name] is a 63 y.o. female.  HPI 63 yo female presents complaining of sensation of tingling all over that began last Saturday.  Patient states they thought she had salivary gland infection due ot swelling of left mandible.  She reports that got better on Wednesday.  Seen by ENT yesterday and told no stone.  She was told to take lemon drops.  Dr. Allena Katz, pmd, saw Wednesday and htought it might be coming from her neck and started prednisone.  Took only Wednesday and Thursday as they caused her nausea and cramping.  Those symptoms resolved with stopping prednisone.  Mild posterior neck pressure. Last Saturday morning neck paind and tightness in left cheek.  No fever, no chills, no n/v/d, no cough or sore throat. Patient has been on synthroid since thyroidectomy 10 years ago ish.       Home Medications Prior to Admission medications   Medication Sig Start Date End Date Taking? Authorizing Provider  cetirizine (ZYRTEC) 10 MG tablet Take 1 tablet (10 mg total) by mouth daily. 11/26/22   Leath-Warren, Sadie Haber, NP  levothyroxine (SYNTHROID) 88 MCG tablet Take 1 tablet (88 mcg total) by mouth daily before breakfast. 10/26/22   Dani Gobble, NP  Multiple Vitamin (MULTIVITAMIN) tablet Take 1 tablet by mouth daily.    [provider]  nicotine polacrilex (NICOTINE MINI) 2 MG lozenge Take 1 lozenge (2 mg total) by mouth as needed for smoking cessation. 01/13/22   Billie Lade, MD  predniSONE (STERAPRED UNI-PAK 21 TAB) 10 MG (21) TBPK tablet Take as package instructions. 01/31/23   Anabel Halon, MD  rosuvastatin (CRESTOR) 20 MG tablet TAKE ONE TABLET BY MOUTH ONCE DAILY. 11/22/22   Billie Lade, MD       Allergies    Patient has no known allergies.    Review of Systems   Review of Systems  Physical Exam Updated Vital Signs BP 102/78   Pulse 66   Temp 97.8 F (36.6 C)   Resp 18   LMP 01/11/2013   SpO2 93%  Physical Exam Vitals reviewed.  HENT:     Head: Normocephalic and atraumatic.     Right Ear: External ear normal.     Left Ear: External ear normal.     Nose: Nose normal.     Mouth/Throat:     Pharynx: Oropharynx is clear.  Eyes:     Extraocular Movements: Extraocular movements intact.     Pupils: Pupils are equal, round, and reactive to light.  Cardiovascular:     Rate and Rhythm: Normal rate and regular rhythm.     Pulses: Normal pulses.     Comments: Left dp and pt not palpable, good femoral and popliteal Pulmonary:     Effort: Pulmonary effort is normal.  Abdominal:     General: Abdomen is flat.     Palpations: Abdomen is soft.  Musculoskeletal:        General: Normal range of motion.     Cervical back: Normal range of motion.  Skin:    General: Skin is warm and dry.     Capillary Refill: Capillary refill takes less than 2 seconds.  Neurological:  General: No focal deficit present.     Mental Status: She is alert.  Psychiatric:        Mood and Affect: Mood normal.     ED Results / Procedures / Treatments   Labs (all labs ordered are listed, but only abnormal results are displayed) Labs Reviewed  CBC WITH DIFFERENTIAL/PLATELET - Abnormal; Notable for the following components:      Result Value   WBC 14.2 (*)    RBC 5.33 (*)    Hemoglobin 15.9 (*)    HCT 47.8 (*)    Neutro Abs 9.0 (*)    Monocytes Absolute 1.2 (*)    All other components within normal limits  COMPREHENSIVE METABOLIC PANEL - Abnormal; Notable for the following components:   Glucose, Bld 108 (*)    All other components within normal limits  TSH - Abnormal; Notable for the following components:   TSH 5.348 (*)    All other components within normal limits  URINALYSIS,  ROUTINE W REFLEX MICROSCOPIC - Abnormal; Notable for the following components:   Leukocytes,Ua MODERATE (*)    Bacteria, UA RARE (*)    All other components within normal limits  T4, FREE    EKG None  Radiology No results found.  Procedures Procedures  {Document cardiac monitor, telemetry assessment procedure when appropriate:1}  Medications Ordered in ED Medications  LORazepam (ATIVAN) injection 0.5 mg (has no administration in time range)  lactated ringers bolus 500 mL (0 mLs Intravenous Stopped 02/03/23 1107)  LORazepam (ATIVAN) injection 0.5 mg (0.5 mg Intravenous Given 02/03/23 1610)    ED Course/ Medical Decision Making/ A&P Clinical Course as of 02/03/23 1300  Sat Feb 03, 2023  1300 TSH elevated at 5.348 Free T4 dry but will not result while in the ED Complete metabolic panel reviewed interpreted and normal CBC reviewed interpreted significant for leukocytosis 14,200 which is slightly increased from 1 several days ago that was 13,000 and hemoglobin 15, platelets normal at 350 [DR]    Clinical Course User Index [DR] Margarita Grizzle, MD   {   Click here for ABCD2, HEART and other calculatorsREFRESH Note before signing :1}                              Medical Decision Making Amount and/or Complexity of Data Reviewed Labs: ordered.  Risk Prescription drug management.  63 year old female presents today complaining of diffuse body paresthesias.  She is not having associated pain.  Differential diagnosis includes but is not limited to metabolic abnormalities especially hypo-kalemia, patient with known thyroid abnormalities could represent hypo or hyperthyroidism, recent swelling of left submandibular area which has resolved making a very stone or infection less likely, hyperglycemia, anxiety. Patient evaluated here with labs and no acute metabolic abnormalities of abnormal electrolytes or hyperglycemia noted.  She does have an elevated TSH at 5.34.  And this may represent some  hypothyroidism.  She is taking her medication as prescribed. She feels better here after Ativan making anxiety more likely.  I do think she may need to have her Synthroid adjusted.  She is advised to follow-up with her primary care doctor on Monday or Tuesday when the T4 should be available.  In the interim I will give her a short course of Ativan.  We have discussed return precautions and need for close follow-up and she voices understanding.   {Document critical care time when appropriate:1} {Document review of labs and clinical decision tools  ie heart score, Chads2Vasc2 etc:1}  {Document your independent review of radiology images, and any outside records:1} {Document your discussion with family members, caretakers, and with consultants:1} {Document social determinants of health affecting pt's care:1} {Document your decision making why or why not admission, treatments were needed:1} Final Clinical Impression(s) / ED Diagnoses Final diagnoses:  None    Rx / DC Orders ED Discharge Orders     None

## 2023-02-03 NOTE — Discharge Instructions (Addendum)
You were evaluated here in the emergency department today for tingling which is also called paresthesias.  You had a complete metabolic panel, CBC, and TSH done.  Your TSH was slightly elevated and this could represent some low thyroid.  Please have this followed up with your doctor as soon as possible. A prescription for Ativan was sent to the pharmacy.  You may not drive or do anything that could be dangerous to you or others while you are taking this or for 24 hours afterwards. Please return if you are having new or worsening symptoms at any time.  Please follow-up with your doctor soon as possible

## 2023-02-05 ENCOUNTER — Telehealth: Payer: Self-pay | Admitting: Nurse Practitioner

## 2023-02-05 NOTE — Telephone Encounter (Signed)
Dr Fransico Him can you look at this, Patient called and said her labs are out of range - she was in the ER. Please advise because patient is not scheduled until October. Please Advise. (She is [name]'s patient)

## 2023-02-05 NOTE — Telephone Encounter (Signed)
Copied from CRM [id number]. Topic: Clinical - Lab/Test Results >> Feb 05, 2023 10:31 AM Carlatta H wrote: Reason for CRM: Patient would like to know xray results//Please call

## 2023-02-05 NOTE — Telephone Encounter (Signed)
Talked with the patient and shared with her what Dr. Fransico Him recommended. Patient is anxious She has been to the ED twice, about a week apart.  She feels that there is something going through her body, just a strange sensation. She feels anxious and feels hyper.One ED physician put in her after visit summary that she may be experiencing paresthesia. Then she describes her feet, they are burning and have a prickling feeling. Neck discomfort, face and neck swelling. She was referred to a doctor for swelling.  A possible salivary gland stone. The swelling had gone down and she was evaluated for the salivary gland stone and was told that she did not have one. She was also on Prednisone for a short time and because of how it made her feel, she was advised to stop the medication. She had a neck x-ray on this past Friday, January 31,2025.  Lorazepam was prescribed. Patient has been taking this and also some Tylenol, especially at night so that she can rest. She voiced that she does not like it and does not want to take it.  Patient does not want to wait until 1-2 weeks to repeat lab work, because she was told that her thyroid was low in the ED. Patient was advised that I would send this to [facility] for review, once it was addressed we would call her back.

## 2023-02-05 NOTE — Telephone Encounter (Signed)
Xrays not reviewed by radiology

## 2023-02-06 ENCOUNTER — Other Ambulatory Visit: Payer: Self-pay

## 2023-02-06 ENCOUNTER — Emergency Department (HOSPITAL_COMMUNITY)
Admission: EM | Admit: 2023-02-06 | Discharge: 2023-02-06 | Discharge status: Against Medical Advice | Payer: Medicaid Other | Attending: Emergency Medicine | Admitting: Emergency Medicine

## 2023-02-06 ENCOUNTER — Encounter (HOSPITAL_COMMUNITY): Payer: Self-pay

## 2023-02-06 DIAGNOSIS — Z5321 Procedure and treatment not carried out due to patient leaving prior to being seen by health care provider: Secondary | ICD-10-CM | POA: Insufficient documentation

## 2023-02-06 DIAGNOSIS — R202 Paresthesia of skin: Secondary | ICD-10-CM | POA: Insufficient documentation

## 2023-02-06 NOTE — Progress Notes (Signed)
 Established Patient Office Visit   Subjective  Patient ID: [name], female    DOB: 1960/04/25  Age: 63 y.o. MRN: [id number]  Chief Complaint  Patient presents with   Acute Visit    Burning/ tingling sensation on both sides of body from neck to feet  x 11 days w/ pressure on back of neck w/ nausea     She  has a past medical history of Arthritis and Hypothyroidism.  Neck Pain Evaluation  The patient reports the onset of neck pain approximately two weeks ago, with no history of trauma or injury preceding the symptoms. The pain is described as persistent and pressure-like in nature. It radiates to the arms and fingers, where she experiences associated numbness and tingling sensations.  She further characterizes the pain as a combination of burning, pinching, and sharp sensations, which occur intermittently throughout the day with moderate intensity. Despite its intermittent nature, the symptoms persist throughout the entire day without significant variation or relief.  Aggravating and Relieving Factors: The patient has not identified any specific factors that exacerbate or alleviate the pain.  Treatment Attempts: She has tried oral steroids and Tylenol , but both have provided minimal relief.  Impact on Daily Activities: The persistent nature of the symptoms, particularly the radiating pain with numbness and tingling, has raised concerns about potential nerve involvement and its impact on daily activities. Further assessment and diagnostic testing may be necessary to identify the underlying cause and develop an appropriate management plan.    Review of Systems  Constitutional:  Negative for chills and fever.  Respiratory:  Negative for shortness of breath.   Cardiovascular:  Negative for chest pain.  Musculoskeletal:  Positive for myalgias and neck pain.  Neurological:  Negative for dizziness and headaches.      Objective:     BP 113/77   Pulse 84   Ht 5' 4.5 (1.638  m)   Wt 154 lb (69.9 kg)   LMP 01/11/2013   SpO2 93%   BMI 26.03 kg/m  BP Readings from Last 3 Encounters:  02/07/23 113/77  02/06/23 132/82  02/03/23 109/89      Physical Exam Vitals reviewed.  Constitutional:      General: She is not in acute distress.    Appearance: Normal appearance. She is not ill-appearing, toxic-appearing or diaphoretic.  HENT:     Head: Normocephalic.  Eyes:     General:        Right eye: No discharge.        Left eye: No discharge.     Conjunctiva/sclera: Conjunctivae normal.  Cardiovascular:     Rate and Rhythm: Normal rate.     Pulses: Normal pulses.     Heart sounds: Normal heart sounds.  Pulmonary:     Effort: Pulmonary effort is normal. No respiratory distress.     Breath sounds: Normal breath sounds.  Abdominal:     General: Bowel sounds are normal.     Palpations: Abdomen is soft.     Tenderness: There is no abdominal tenderness. There is no right CVA tenderness, left CVA tenderness or guarding.  Musculoskeletal:     Cervical back: Tenderness present. No edema or erythema. Pain with movement present. Decreased range of motion.  Skin:    General: Skin is warm and dry.     Capillary Refill: Capillary refill takes less than 2 seconds.  Neurological:     General: No focal deficit present.     Mental Status: She is alert  and oriented to person, place, and time.     Coordination: Coordination normal.     Gait: Gait normal.  Psychiatric:        Mood and Affect: Mood normal.        Behavior: Behavior normal.        Thought Content: Thought content normal.        Judgment: Judgment normal.      No results found for any visits on 02/07/23.  The 10-year ASCVD risk score (Arnett DK, et al., 2019) is: 5.3%    Assessment & Plan:  Iron deficiency anemia, unspecified iron deficiency anemia type -     Vitamin B12 -     Iron, TIBC and Ferritin Panel -     Folate  Other polyneuropathy -     Gabapentin ; Take 1 capsule (300 mg total) by  mouth 2 (two) times daily as needed.  Dispense: 60 capsule; Refill: 3  Neck pain Assessment & Plan: Medication Trial: Gabapentin  300 mg, prescribed twice daily as needed. Diagnostic Imaging: X-ray ordered by Dr. Tobie last week; results are still pending. Specialist Referral: Orthopedic referral has been initiated. I explained to the patient that non-pharmacological interventions include the application of ice or heat, rest, and recommended range of motion exercises along with gentle stretching. For pain management, Tylenol  was advised. The patient was instructed to follow up if symptoms worsen or persist. The patient verbalized understanding of the care plan, and all questions were answered.   Orders: -     Ambulatory referral to Orthopedics    Return if symptoms worsen or fail to improve.   Hilario Kidd Wilhelmena Falter, FNP

## 2023-02-06 NOTE — Telephone Encounter (Signed)
 I reviewed everything from the ENT notes, ER notes and labs and I agree with Dr. Lenis, I recommend repeating labs in another 2 weeks.  The prednisone  use (as temporary as it was) can cause abnormalities with the thyroid  labs and thus we need to give her time for it to completely work its way out of her system.  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  Paraesthesias is a medical term that means numbness/tingling of extremities, it is non specific and can be coming from any number of problems like vitamin deficiencies, pinched nerves, etc.  Her TSH in the ED was elevated (suggestive that her thyroid  dosage is not enough) and Free T4 was a bit elevated (suggestive that her thyroid  dosage is too high).  These don't match up, thus we need to wait 2 weeks and repeat TSH FT3 and FT4 at that time.

## 2023-02-06 NOTE — Patient Instructions (Signed)

## 2023-02-06 NOTE — Telephone Encounter (Signed)
Patient was called and given Whitney Reardon's recommendation. She is currently waiting to be seen in the ED.

## 2023-02-06 NOTE — ED Triage Notes (Signed)
 Pt presents to ED with complaints of tingling all over body. Pt states has been going on for over 1 week, started with neck pain. Pt has been seeing NP at Dr Lenis office. Nurse on phone when patient presented to triage. NP note in chart about recommendations.

## 2023-02-07 ENCOUNTER — Encounter: Payer: Self-pay | Admitting: Family Medicine

## 2023-02-07 ENCOUNTER — Ambulatory Visit: Payer: Medicaid Other | Admitting: Family Medicine

## 2023-02-07 VITALS — BP 113/77 | HR 84 | Ht 64.5 in | Wt 154.0 lb

## 2023-02-07 DIAGNOSIS — E785 Hyperlipidemia, unspecified: Secondary | ICD-10-CM | POA: Insufficient documentation

## 2023-02-07 DIAGNOSIS — E042 Nontoxic multinodular goiter: Secondary | ICD-10-CM | POA: Insufficient documentation

## 2023-02-07 DIAGNOSIS — G6289 Other specified polyneuropathies: Secondary | ICD-10-CM | POA: Diagnosis not present

## 2023-02-07 DIAGNOSIS — M542 Cervicalgia: Secondary | ICD-10-CM

## 2023-02-07 DIAGNOSIS — D509 Iron deficiency anemia, unspecified: Secondary | ICD-10-CM

## 2023-02-07 DIAGNOSIS — I10 Essential (primary) hypertension: Secondary | ICD-10-CM | POA: Insufficient documentation

## 2023-02-07 DIAGNOSIS — E039 Hypothyroidism, unspecified: Secondary | ICD-10-CM | POA: Insufficient documentation

## 2023-02-07 MED ORDER — GABAPENTIN 300 MG PO CAPS: 300.0000 mg | ORAL_CAPSULE | Freq: Two times a day (BID) | ORAL | 3 refills | Status: DC | PRN

## 2023-02-07 NOTE — Assessment & Plan Note (Signed)
 Medication Trial: Gabapentin  300 mg, prescribed twice daily as needed. Diagnostic Imaging: X-ray ordered by Dr. Tobie last week; results are still pending. Specialist Referral: Orthopedic referral has been initiated. I explained to the patient that non-pharmacological interventions include the application of ice or heat, rest, and recommended range of motion exercises along with gentle stretching. For pain management, Tylenol  was advised. The patient was instructed to follow up if symptoms worsen or persist. The patient verbalized understanding of the care plan, and all questions were answered.

## 2023-02-08 ENCOUNTER — Telehealth: Payer: Self-pay

## 2023-02-08 ENCOUNTER — Telehealth: Payer: Self-pay | Admitting: Internal Medicine

## 2023-02-08 LAB — IRON,TIBC AND FERRITIN PANEL
Ferritin: 160 ng/mL — ABNORMAL HIGH (ref 15–150)
Iron Saturation: 38 % (ref 15–55)
Iron: 143 ug/dL (ref 27–159)
Total Iron Binding Capacity: 373 ug/dL (ref 250–450)
UIBC: 230 ug/dL (ref 131–425)

## 2023-02-08 LAB — FOLATE: Folate: >20 ng/mL (ref >3.0)

## 2023-02-08 NOTE — Telephone Encounter (Signed)
 Copied from CRM [id number]. Topic: Referral - Status >> Feb 08, 2023  2:34 PM Tisa Forester wrote: Reason for CRM: checking on status onget a referral to ortho having neck pain  thinking its nerve pain going out thru body going on about 12 days now

## 2023-02-08 NOTE — Telephone Encounter (Signed)
 Referral placed yesterday , xray results pending

## 2023-02-08 NOTE — Telephone Encounter (Signed)
 Copied from CRM [id number]. Topic: Clinical - Lab/Test Results >> Feb 08, 2023  2:31 PM Tisa Forester wrote: Reason for CRM: request lab results

## 2023-02-09 ENCOUNTER — Ambulatory Visit
Admission: EM | Admit: 2023-02-09 | Discharge: 2023-02-09 | Disposition: A | Discharge status: Home / Self Care | Payer: Medicaid Other | Attending: Family Medicine | Admitting: Family Medicine

## 2023-02-09 DIAGNOSIS — H938X2 Other specified disorders of left ear: Secondary | ICD-10-CM

## 2023-02-09 DIAGNOSIS — H6122 Impacted cerumen, left ear: Secondary | ICD-10-CM

## 2023-02-09 MED ORDER — DEXAMETHASONE SODIUM PHOSPHATE 10 MG/ML IJ SOLN
10.0000 mg | Freq: Once | INTRAMUSCULAR | Status: AC
  Administered 2023-02-09: 10 mg via INTRAMUSCULAR

## 2023-02-09 MED ORDER — CARBAMIDE PEROXIDE 6.5 % OT SOLN: 5.0000 [drp] | Freq: Two times a day (BID) | OTIC | 0 refills | Status: DC

## 2023-02-09 MED ORDER — FLUTICASONE PROPIONATE 50 MCG/ACT NA SUSP: 1.0000 | Freq: Two times a day (BID) | NASAL | 2 refills | Status: DC

## 2023-02-09 NOTE — ED Triage Notes (Signed)
 Pt reports she has some pressure in her left ear x 1 week

## 2023-02-09 NOTE — Discharge Instructions (Signed)
 I suspect some eustachian tube dysfunction causing the pressure in your left ear.  We have given you a steroid shot to help reduce inflammation in this area in addition to trying Flonase  nasal spray.  You did also have some significant earwax buildup in this ear which we removed most of but I have prescribed some wax softening drops to help remove the rest over time.  You may use warm water  and a bulb syringe or the shower head to help remove this over time.

## 2023-02-10 NOTE — ED Provider Notes (Addendum)
 [facility]    CSN: [id number] Arrival date & time: 02/09/23  1127      History   Chief Complaint Chief Complaint  Patient presents with   ear pressure    HPI [name] is a 63 y.o. female.   Presenting today with 1 week history of left ear pressure, muffled hearing, popping.  Denies fever, chills, drainage, headache, loss of hearing.  So for not trying anything over-the-counter for symptoms.    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   Neck pain 02/02/2023   Cervical pain (neck) 01/31/2023   Tingling in extremities 01/31/2023   Encounter for examination following treatment at hospital 01/31/2023   Tobacco use 10/11/2021   Encounter to establish care 10/11/2021   Prediabetes 03/31/2019   Hyperlipidemia 09/23/2015   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   Special screening for malignant neoplasms, colon    S/P thyroidectomy 08/15/2013    Past Surgical History:  Procedure Laterality Date   APPENDECTOMY     Bilateral bunionectomy     COLONOSCOPY N/A 01/01/2015   Procedure: COLONOSCOPY;  Surgeon: Margo LITTIE Haddock, MD;  Location: [facility];  Service: Endoscopy;  Laterality: N/A;  11:15 AM - pt knows to arrive at 11:15   LID LESION EXCISION Right 12/29/2020   Procedure: LID LESION EXCISION;  Surgeon: Jacques Sharper, MD;  Location: [facility];  Service: Ophthalmology;  Laterality: Right;  LOCAL   THYROIDECTOMY N/A 08/15/2013   Procedure: TOTAL THYROIDECTOMY;  Surgeon: Oneil DELENA Budge, MD;  Location: [facility];  Service: General;  Laterality: N/A;    OB History   No obstetric history on file.      Home Medications    Prior to Admission medications    Medication Sig Start Date End Date Taking? Authorizing Provider  carbamide peroxide (DEBROX) 6.5 % OTIC solution Place 5 drops into the left ear 2 (two) times daily. 02/09/23  Yes Stuart Vernell Norris, PA-C  fluticasone  (FLONASE ) 50 MCG/ACT nasal spray Place 1 spray into both nostrils 2 (two) times daily. 02/09/23  Yes Stuart Vernell Norris, PA-C  cetirizine  (ZYRTEC ) 10 MG tablet Take 1 tablet (10 mg total) by mouth daily. 11/26/22   Leath-Warren, Etta PARAS, NP  gabapentin  (NEURONTIN ) 300 MG capsule Take 1 capsule (300 mg total) by mouth 2 (two) times daily as needed. 02/07/23   Del Orbe Polanco, Iliana, FNP  levothyroxine  (SYNTHROID ) 88 MCG tablet Take 1 tablet (88 mcg total) by mouth daily before breakfast. 10/26/22   Therisa Benton PARAS, NP  LORazepam  (ATIVAN ) 1 MG tablet Take 0.5 tablets (0.5 mg total) by mouth 3 (three) times daily as needed for anxiety. 02/03/23   Levander Houston, MD  Multiple Vitamin (MULTIVITAMIN) tablet Take 1 tablet by mouth daily.    [provider]  nicotine  polacrilex (NICOTINE  MINI) 2 MG lozenge Take 1 lozenge (2 mg total) by mouth as needed for smoking cessation. 01/13/22   Melvenia Manus BRAVO, MD  predniSONE  (STERAPRED UNI-PAK 21 TAB) 10 MG (21) TBPK tablet Take as package instructions. 01/31/23   Tobie Suzzane POUR, MD  rosuvastatin  (CRESTOR ) 20 MG tablet TAKE ONE TABLET BY MOUTH ONCE DAILY. 11/22/22   Melvenia Manus BRAVO, MD    Family  History History reviewed. No pertinent family history.  Social History Social History   Tobacco Use   Smoking status: Every Day    Current packs/day: 0.10    Average packs/day: 0.1 packs/day for 15.0 years (1.5 ttl pk-yrs)    Types: Cigarettes   Smokeless tobacco: Never  Vaping Use   Vaping status: Never Used  Substance Use Topics   Alcohol use: No   Drug use: No     Allergies   Patient has no known allergies.   Review of Systems Review of Systems Per HPI  Physical Exam Triage Vital Signs ED Triage Vitals   Encounter Vitals Group     BP 02/09/23 1258 114/82     Systolic BP Percentile --      Diastolic BP Percentile --      Pulse Rate 02/09/23 1258 79     Resp 02/09/23 1258 18     Temp 02/09/23 1258 98.5 F (36.9 C)     Temp Source 02/09/23 1258 Oral     SpO2 02/09/23 1258 97 %     Weight --      Height --      Head Circumference --      Peak Flow --      Pain Score 02/09/23 1256 5     Pain Loc --      Pain Education --      Exclude from Growth Chart --    No data found.  Updated Vital Signs BP 114/82 (BP Location: Right Arm)   Pulse 79   Temp 98.5 F (36.9 C) (Oral)   Resp 18   LMP 01/11/2013   SpO2 97%   Visual Acuity Right Eye Distance:   Left Eye Distance:   Bilateral Distance:    Right Eye Near:   Left Eye Near:    Bilateral Near:     Physical Exam Vitals and nursing note reviewed.  Constitutional:      Appearance: Normal appearance. She is not ill-appearing.  HENT:     Head: Atraumatic.     Right Ear: Tympanic membrane normal.     Left Ear: There is impacted cerumen.     Ears:     Comments: Left middle ear effusion present once cerumen debris removed via lavage.  TMs intact bilaterally post procedure    Mouth/Throat:     Mouth: Mucous membranes are moist.     Pharynx: Oropharynx is clear.  Eyes:     Extraocular Movements: Extraocular movements intact.     Conjunctiva/sclera: Conjunctivae normal.  Cardiovascular:     Rate and Rhythm: Normal rate and regular rhythm.     Heart sounds: Normal heart sounds.  Pulmonary:     Effort: Pulmonary effort is normal.     Breath sounds: Normal breath sounds.  Musculoskeletal:        General: Normal range of motion.     Cervical back: Normal range of motion and neck supple.  Skin:    General: Skin is warm and dry.  Neurological:     Mental Status: She is alert and oriented to person, place, and time.  Psychiatric:        Mood and Affect: Mood normal.        Thought Content: Thought content normal.         Judgment: Judgment normal.      UC Treatments / Results  Labs (all labs ordered are listed, but only abnormal results are displayed) Labs Reviewed - No data to  display  EKG   Radiology No results found.  Procedures Procedures (including critical care time)  Medications Ordered in UC Medications  dexamethasone  (DECADRON ) injection 10 mg (10 mg Intramuscular Given 02/09/23 1405)    Initial Impression / Assessment and Plan / UC Course  I have reviewed the triage vital signs and the nursing notes.  Pertinent labs & imaging results that were available during my care of the patient were reviewed by me and considered in my medical decision making (see chart for details).     Lavage performed left ear with warm water  and peroxide with almost complete clearance of wax impaction.  Debrox drops given for remainder of debris removal.  Do suspect this was partially responsible for her symptoms but does also appear to have a middle ear effusion on the left.  IM Decadron , Flonase , antihistamines.  Return for worsening symptoms.  Final Clinical Impressions(s) / UC Diagnoses   Final diagnoses:  Ear pressure, left  Impacted cerumen of left ear     Discharge Instructions      I suspect some eustachian tube dysfunction causing the pressure in your left ear.  We have given you a steroid shot to help reduce inflammation in this area in addition to trying Flonase  nasal spray.  You did also have some significant earwax buildup in this ear which we removed most of but I have prescribed some wax softening drops to help remove the rest over time.  You may use warm water  and a bulb syringe or the shower head to help remove this over time.    ED Prescriptions     Medication Sig Dispense Auth. Provider   carbamide peroxide (DEBROX) 6.5 % OTIC solution Place 5 drops into the left ear 2 (two) times daily. 15 mL Stuart Vernell Norris, PA-C   fluticasone  (FLONASE ) 50 MCG/ACT nasal spray Place 1  spray into both nostrils 2 (two) times daily. 16 g Stuart Vernell Norris, [place]      PDMP not reviewed this encounter.   Stuart Vernell Norris, [place] 02/10/23 1421    Stuart Vernell Herrick, [place] 02/10/23 1422

## 2023-02-13 ENCOUNTER — Ambulatory Visit: Payer: Medicaid Other | Admitting: Internal Medicine

## 2023-02-13 ENCOUNTER — Encounter: Payer: Self-pay | Admitting: Internal Medicine

## 2023-02-13 VITALS — BP 110/74 | HR 90 | Ht 64.5 in | Wt 154.0 lb

## 2023-02-13 DIAGNOSIS — S161XXA Strain of muscle, fascia and tendon at neck level, initial encounter: Secondary | ICD-10-CM | POA: Diagnosis not present

## 2023-02-13 DIAGNOSIS — F419 Anxiety disorder, unspecified: Secondary | ICD-10-CM | POA: Diagnosis not present

## 2023-02-13 MED ORDER — HYDROXYZINE PAMOATE 25 MG PO CAPS: 25.0000 mg | ORAL_CAPSULE | Freq: Three times a day (TID) | ORAL | 1 refills | Status: DC | PRN

## 2023-02-13 NOTE — Patient Instructions (Signed)
It was a pleasure to see you today.  Thank you for giving [place] the opportunity to be involved in your care.  Below is a brief recap of your visit and next steps.  We will plan to see you again in July.  Summary As we discussed, I recommend using hydroxyzine as needed for anxiety / stress relief Physical therapy referral placed Discontinue gabapentin and lorazepam Return to care if symptoms worsen or fail to improve

## 2023-02-13 NOTE — Progress Notes (Unsigned)
Established Patient Office Visit  Subjective   Patient ID: [name], female    DOB: 08-Oct-1960  Age: 63 y.o. MRN: [id number]  Chief Complaint  Patient presents with   Follow-up    ER follow up    [name] returns to care today for ER follow-up.  Last evaluated by me on 1/24 for routine follow-up.  No medication changes were made and 41-month follow-up was arranged.  [name] presented to the emergency department on 1/26 endorsing left-sided neck pain and a feeling of puffiness on the neck.  [name] also described pain behind the left mandible, dry mouth, and a sensation of "wooziness".  There was concern for sialolithiasis based on [name] symptoms.  ENT follow-up recommended.  [name] then presented to [facility] for ER follow-up on 1/29 endorsing persistent left-sided neck discomfort with a tingling sensation in the upper extremities that was intermittent.  X-rays of the cervical spine were ordered and a prednisone pack was prescribed for symptom relief.  [name] was evaluated by ENT and reassured that [name] symptoms were not attributable to sialolithiasis.  [name] returned to the emergency department on 2/1 endorsing intermittent, diffuse paresthesias.  Labs revealed a mildly elevated TSH but normal free T4.  [name] endorsed symptomatic improvement after receiving lorazepam, making anxiety a more likely explanation for [name] symptoms.  [name] then presented to [facility] on 2/5 for an acute visit endorsing a diffuse, intermittent burning/tingling sensation.  Gabapentin was prescribed for suspected neuropathy.  Most recently, [name] presented to urgent care on 2/11 endorsing left ear discomfort.  Treated supportively for impacted cerumen of the left ear.  Today [name] reports feeling fairly well.  [name] endorses dull discomfort in the left side of [name] neck but feels that pain is overall improving.  Episodes of burning/tingling in [name] upper and lower extremities have decreased in frequency.  [name] has not appreciated any significant changes  since taking gabapentin.  [name] is not sleeping well at night and is not sure if this is related to missing a recent dose of gabapentin.  [name] states that recent events have made [name] feel more anxious and stressed.  Past Medical History:  Diagnosis Date   Arthritis    Hypothyroidism    Past Surgical History:  Procedure Laterality Date   APPENDECTOMY     Bilateral bunionectomy     COLONOSCOPY N/A 01/01/2015   Procedure: COLONOSCOPY;  Surgeon: West Bali, MD;  [facility]: [facility];  Service: Endoscopy;  Laterality: N/A;  11:15 AM - pt knows to arrive at 11:15   LID LESION EXCISION Right 12/29/2020   Procedure: LID LESION EXCISION;  Surgeon: [name], MD;  Location: [facility];  Service: Ophthalmology;  Laterality: Right;  LOCAL   THYROIDECTOMY N/A 08/15/2013   Procedure: TOTAL THYROIDECTOMY;  Surgeon: [name], MD;  Location: [facility];  Service: General;  Laterality: N/A;   Social History   Tobacco Use   Smoking status: Every Day    Current packs/day: 0.10    Average packs/day: 0.1 packs/day for 15.0 years (1.5 ttl pk-yrs)    Types: Cigarettes   Smokeless tobacco: Never  Vaping Use   Vaping status: Never Used  Substance Use Topics   Alcohol use: No   Drug use: No   History reviewed. No pertinent family history. No Known Allergies  Review of Systems  Musculoskeletal:  Positive for neck pain (Left-sided, improving).  Psychiatric/Behavioral:  The patient is nervous/anxious.      Objective:  BP 110/74 (BP Location: Left Arm, Patient Position: Sitting, Cuff Size: Normal)   Pulse 90   Ht 5' 4.5" (1.638 m)   Wt 154 lb (69.9 kg)   LMP 01/11/2013   SpO2 96%   BMI 26.03 kg/m  BP Readings from Last 3 Encounters:  02/13/23 110/74  02/09/23 114/82  02/07/23 113/77   Physical Exam Constitutional:      General: [name] is not in acute distress.    Appearance: Normal appearance. [name] is not toxic-appearing.  HENT:     Head: Normocephalic and  atraumatic.     Right Ear: External ear normal.     Left Ear: External ear normal.     Nose: Nose normal. No congestion or rhinorrhea.     Mouth/Throat:     Mouth: Mucous membranes are moist.     Pharynx: Oropharynx is clear. No oropharyngeal exudate or posterior oropharyngeal erythema.  Eyes:     General: No scleral icterus.    Extraocular Movements: Extraocular movements intact.     Conjunctiva/sclera: Conjunctivae normal.     Pupils: Pupils are equal, round, and reactive to light.  Cardiovascular:     Rate and Rhythm: Normal rate and regular rhythm.     Pulses: Normal pulses.     Heart sounds: Normal heart sounds. No murmur heard.    No friction rub. No gallop.  Pulmonary:     Effort: Pulmonary effort is normal.     Breath sounds: Normal breath sounds. No wheezing, rhonchi or rales.  Abdominal:     General: Abdomen is flat. Bowel sounds are normal. There is no distension.     Palpations: Abdomen is soft.     Tenderness: There is no abdominal tenderness.  Musculoskeletal:        General: No swelling. Normal range of motion.     Cervical back: Normal range of motion.     Right lower leg: No edema.     Left lower leg: No edema.  Lymphadenopathy:     Cervical: No cervical adenopathy.  Skin:    General: Skin is warm and dry.     Capillary Refill: Capillary refill takes less than 2 seconds.     Coloration: Skin is not jaundiced.  Neurological:     General: No focal deficit present.     Mental Status: [name] is alert and oriented to person, place, and time.  Psychiatric:        Mood and Affect: Mood normal.        Behavior: Behavior normal.   Last CBC Lab Results  Component Value Date   WBC 14.2 (H) 02/03/2023   HGB 15.9 (H) 02/03/2023   HCT 47.8 (H) 02/03/2023   MCV 89.7 02/03/2023   MCH 29.8 02/03/2023   RDW 12.7 02/03/2023   PLT 350 02/03/2023   Last metabolic panel Lab Results  Component Value Date   GLUCOSE 108 (H) 02/03/2023   NA 139 02/03/2023   K 4.7  02/03/2023   CL 105 02/03/2023   CO2 26 02/03/2023   BUN 18 02/03/2023   CREATININE 0.65 02/03/2023   GFRNONAA >60 02/03/2023   CALCIUM 9.3 02/03/2023   PROT 7.6 02/03/2023   ALBUMIN 4.5 02/03/2023   LABGLOB 2.2 10/06/2022   AGRATIO 2.1 10/11/2021   BILITOT 0.6 02/03/2023   ALKPHOS 50 02/03/2023   AST 17 02/03/2023   ALT 24 02/03/2023   ANIONGAP 8 02/03/2023   Last lipids Lab Results  Component Value Date   CHOL 158 01/26/2023  HDL 44 01/26/2023   LDLCALC 73 01/26/2023   TRIG 255 (H) 01/26/2023   CHOLHDL 3.6 01/26/2023   Last hemoglobin A1c Lab Results  Component Value Date   HGBA1C 5.9 (H) 10/06/2022   Last thyroid functions Lab Results  Component Value Date   TSH 5.348 (H) 02/03/2023   Last vitamin D Lab Results  Component Value Date   VD25OH 94.0 10/06/2022   Last vitamin B12 and Folate Lab Results  Component Value Date   FOLATE >20.0 02/07/2023   The 10-year ASCVD risk score (Arnett DK, et al., 2019) is: 5%    Assessment & Plan:   Problem List Items Addressed This Visit       Strain of cervical portion of left trapezius muscle   Returning to care today for ER follow-up in the setting of multiple recent ER/urgent care presentations.  [name] states that the left-sided neck pain is improving.  There is mild tenderness palpation over the cervical portion of the left trapezius.  ROM of the neck is generally intact, though [name] does have some discomfort with lateral movements to the left.  [name] may have experienced radicular symptoms in [name] left hand but denies currently.  Previously treated with prednisone pack.  X-rays of the cervical spine have been completed but the read is still pending. -Treatment options were reviewed.  [name] prefers formal physical therapy for further rehabilitation.  Referral placed today.      Anxiety - Primary   [name] reports that recent events have increased [name] stress and anxiety level.  [name] has endorsed diffuse, intermittent  paresthesias on multiple occasions.  Symptoms were alleviated in the emergency department after receiving lorazepam.  No obvious metabolic abnormalities noted on recent blood work.  I believe that [name] symptoms may be attributable to anxiety.  [name] expresses similar sentiment.  Hydroxyzine 25 mg 3 times daily as needed for anxiety relief added today.  This can also help with insomnia.  [name] will return to care if symptoms worsen or fail to improve.  Gabapentin discontinued.       Return if symptoms worsen or fail to improve.    Billie Lade, MD

## 2023-02-14 ENCOUNTER — Ambulatory Visit: Payer: Medicaid Other | Admitting: Orthopedic Surgery

## 2023-02-14 DIAGNOSIS — S161XXA Strain of muscle, fascia and tendon at neck level, initial encounter: Secondary | ICD-10-CM | POA: Insufficient documentation

## 2023-02-14 DIAGNOSIS — F419 Anxiety disorder, unspecified: Secondary | ICD-10-CM | POA: Insufficient documentation

## 2023-02-14 NOTE — Therapy (Unsigned)
Marland Kitchen

## 2023-02-14 NOTE — Assessment & Plan Note (Signed)
Returning to care today for ER follow-up in the setting of multiple recent ER/urgent care presentations.  She states that the left-sided neck pain is improving.  There is mild tenderness palpation over the cervical portion of the left trapezius.  ROM of the neck is generally intact, though she does have some discomfort with lateral movements to the left.  She may have experienced radicular symptoms in her left hand but denies currently.  Previously treated with prednisone pack.  X-rays of the cervical spine have been completed but the read is still pending. -Treatment options were reviewed.  She prefers formal physical therapy for further rehabilitation.  Referral placed today.

## 2023-02-14 NOTE — Assessment & Plan Note (Addendum)
[name] reports that recent events have increased her stress and anxiety level.  She has endorsed diffuse, intermittent paresthesias on multiple occasions.  Symptoms were alleviated in the emergency department after receiving lorazepam.  No obvious metabolic abnormalities noted on recent blood work.  I believe that her symptoms may be attributable to anxiety.  She expresses similar sentiment.  Hydroxyzine 25 mg 3 times daily as needed for anxiety relief added today.  This can also help with insomnia.  She will return to care if symptoms worsen or fail to improve.  Gabapentin discontinued.

## 2023-02-15 ENCOUNTER — Ambulatory Visit (HOSPITAL_COMMUNITY): Payer: Medicaid Other | Attending: Internal Medicine

## 2023-02-15 ENCOUNTER — Other Ambulatory Visit: Payer: Self-pay

## 2023-02-15 DIAGNOSIS — M542 Cervicalgia: Secondary | ICD-10-CM | POA: Insufficient documentation

## 2023-02-15 DIAGNOSIS — S161XXA Strain of muscle, fascia and tendon at neck level, initial encounter: Secondary | ICD-10-CM | POA: Insufficient documentation

## 2023-02-15 DIAGNOSIS — M436 Torticollis: Secondary | ICD-10-CM | POA: Insufficient documentation

## 2023-02-15 NOTE — Therapy (Signed)
OUTPATIENT PHYSICAL THERAPY CERVICAL EVALUATION   Patient Name: [name] MRN: [id number] DOB:1960-06-20, 63 y.o., female Today's Date: 02/15/2023  END OF SESSION:  PT End of Session - 02/15/23 1302     Visit Number 1    Number of Visits 6    Date for PT Re-Evaluation 03/09/23    Authorization Type  Medicaid Prepaid    PT Start Time 1300    PT Stop Time 1346    PT Time Calculation (min) 46 min    Activity Tolerance Patient tolerated treatment well             Past Medical History:  Diagnosis Date   Arthritis    Hypothyroidism    Past Surgical History:  Procedure Laterality Date   APPENDECTOMY     Bilateral bunionectomy     COLONOSCOPY N/A 01/01/2015   Procedure: COLONOSCOPY;  Surgeon: West Bali, MD;  [facility];  Service: Endoscopy;  Laterality: N/A;  11:15 AM - pt knows to arrive at 11:15   LID LESION EXCISION Right 12/29/2020   Procedure: LID LESION EXCISION;  Surgeon: [name], MD;  Location: [facility];  Service: Ophthalmology;  Laterality: Right;  LOCAL   THYROIDECTOMY N/A 08/15/2013   Procedure: TOTAL THYROIDECTOMY;  Surgeon: [name], MD;  Location: [facility];  Service: General;  Laterality: N/A;   Patient Active Problem List   Diagnosis Date Noted   Strain of cervical portion of left trapezius muscle 02/14/2023   Anxiety 02/14/2023   Adult hypothyroidism 02/07/2023   Benign essential HTN 02/07/2023   HLD (hyperlipidemia) 02/07/2023   Goiter, nontoxic, 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   Neck pain 02/02/2023   Cervical pain (neck) 01/31/2023   Tingling in extremities 01/31/2023   Encounter for examination following treatment at hospital 01/31/2023   Tobacco use 10/11/2021   Encounter to establish care 10/11/2021   Prediabetes 03/31/2019   Hyperlipidemia 09/23/2015   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  Later her face started getting numb so next day went to ER; diagnosed with salivary gland stone; but that was gone; so later went to Urgent Care and they flushed out x 6 left ear; wax and gave nasal spray and using warm compresses so that is now gone.  Still has a little tightness in her neck.   Hand dominance: Left  PERTINENT HISTORY:  TMJ anxiety  PAIN:  Are you having pain? Yes: NPRS scale: 1/10 Pain location: left side of neck Pain description: tight Aggravating factors: moving, turning her head Relieving factors: intermittent, heat, tylenol and ibuprofen  PRECAUTIONS: None   WEIGHT BEARING RESTRICTIONS: No  FALLS:  Has patient fallen in last 6 months? No  OCCUPATION: not working  PLOF: Independent  PATIENT GOALS: not feel stress on my neck  NEXT MD VISIT: PRN  OBJECTIVE:  Note: Objective measures were completed at Evaluation unless otherwise noted.  DIAGNOSTIC FINDINGS:    PATIENT SURVEYS:  NDI 14/50 28%  COGNITION: Overall cognitive status: Within functional limits for tasks assessed  SENSATION: denies  POSTURE: rounded shoulders and forward head  PALPATION: Left upper trap and levator tighter than right   CERVICAL ROM:   Active ROM AROM (deg) eval  Flexion 20  Extension 25  Right  lateral flexion 28  Left lateral flexion 30  Right rotation 40  Left rotation 43*   (Blank rows = not tested)  UPPER EXTREMITY ROM:  Active ROM Right eval Left eval  Shoulder flexion wfl wfl  Shoulder extension    Shoulder abduction    Shoulder adduction    Shoulder extension    Shoulder internal rotation    Shoulder external rotation    Elbow flexion    Elbow extension    Wrist flexion    Wrist extension    Wrist ulnar deviation    Wrist radial deviation    Wrist pronation    Wrist supination     (Blank rows = not tested)  UPPER EXTREMITY MMT:  MMT Right eval Left eval  Shoulder flexion 5 5  Shoulder extension    Shoulder abduction 5 5  Shoulder adduction    Shoulder extension    Shoulder internal rotation    Shoulder external rotation    Middle trapezius    Lower trapezius    Elbow flexion    Elbow extension    Wrist flexion    Wrist extension    Wrist ulnar deviation    Wrist radial deviation    Wrist pronation    Wrist supination    Grip strength     (Blank rows = not tested)   TREATMENT DATE: 02/15/23 physical therapy evaluation and HEP instruction  ;  Moist heat to left upper trap x 5' STM to left upper trap and cervical paraspinals to decrease tissue tightness and improve cervical mobility  PATIENT EDUCATION:  Education details: Patient educated on exam findings, POC, scope of PT, HEP, and what to expect next visit. Person educated: Patient Education method: Explanation, Demonstration, and Handouts Education comprehension: verbalized understanding, returned demonstration, verbal cues required, and tactile cues required  HOME EXERCISE PROGRAM: Access Code: [email or web address] URL: [email or web address] Date: 02/15/2023 Prepared by: [facility]  Exercises - Seated Upper Trapezius Stretch  - 1 x daily - 7 x weekly - 1  sets - 3 reps - Seated Levator Scapulae Stretch  - 1 x daily - 7 x weekly - 1 sets - 3 reps  ASSESSMENT:  CLINICAL IMPRESSION: Patient is a 63 y.o. female who was seen today for physical therapy evaluation and treatment for S16.1XXA (ICD-10-CM) - Strain of cervical portion of left trapezius muscle.  Patient demonstrates decreased strength, ROM restriction, reduced flexibility, increased tenderness to palpation and postural abnormalities which are likely contributing to symptoms of pain and are negatively impacting patient ability to perform ADLs. Patient will benefit from skilled physical therapy services to address these deficits to reduce pain and improve level of function with ADLs   OBJECTIVE IMPAIRMENTS: decreased activity tolerance, decreased mobility, decreased ROM, increased fascial restrictions, impaired perceived functional ability, and pain.   ACTIVITY LIMITATIONS: lifting, bending, sleeping, and dressing  PARTICIPATION LIMITATIONS: meal prep, cleaning, and laundry  REHAB POTENTIAL: Good  CLINICAL DECISION MAKING: Evolving/moderate complexity  EVALUATION COMPLEXITY: Moderate   GOALS: Goals reviewed with patient? No  SHORT TERM GOALS: Target date: 03/02/23  patient will be independent with initial HEP  Baseline:  Goal status: INITIAL  2.  Patient will increase cervical rotation by 20 degrees total to improve ability to scan for safety with driving Baseline: see above Goal status: INITIAL   LONG TERM GOALS: Target date: 03/09/2023  Patient will be independent in self management strategies to improve quality of life and functional outcomes.  Baseline:  Goal status: INITIAL  2.  Patient will report 70% improvement overall  Baseline:  Goal status: INITIAL  3.  Patient will improve NDI score by 4 points (10/50 or less) to demonstrate improved perceived function  Baseline: 14/50 Goal status: INITIAL  4.  Patient will improve total cervical mobility by 40  degrees to improve ability to scan environment for safety Baseline: see above Goal status: INITIAL  5.  Patient will be able to read or look at her phone for 10' without pain > 3/10 in the neck and upper trap Baseline:  Goal status: INITIAL   PLAN:  PT FREQUENCY: 2x/week  PT DURATION: 3 weeks  PLANNED INTERVENTIONS: 97164- PT Re-evaluation, 97110-Therapeutic exercises, 97530- Therapeutic activity, 97112- Neuromuscular re-education, 97535- Self Care, 16109- Manual therapy, [phone]- Gait training, [phone]- Orthotic Fit/training, [phone]- Canalith repositioning, [id number]- Aquatic Therapy, [phone]- Splinting, Patient/Family education, Balance training, Stair training, Taping, Dry Needling, Joint mobilization, Joint manipulation, Spinal manipulation, Spinal mobilization, Scar mobilization, and DME instructions.   PLAN FOR NEXT SESSION: Review HEP and goals; STM and moist heat as needed to left upper trap, cervical mobility and postural strengthening' cervical retractions   1:53 PM, 02/15/23 Shalawn Wynder Small Kimani Bedoya MPT [facility] [place] [phone] Ph:[phone]   Managed Medicaid Authorization Request  Visit Dx Codes: Mar 19, 2023, M43.6  Functional Tool Score: NDI 14/50; 28%  For all possible CPT codes, reference the Planned Interventions line above.     Check all conditions that are expected to impact treatment: {Conditions expected to impact treatment:None of these apply   If treatment provided at initial evaluation, no treatment  charged due to lack of authorization.

## 2023-02-16 ENCOUNTER — Telehealth: Payer: Self-pay

## 2023-02-16 DIAGNOSIS — F419 Anxiety disorder, unspecified: Secondary | ICD-10-CM

## 2023-02-16 MED ORDER — HYDROXYZINE HCL 10 MG PO TABS: 10.0000 mg | ORAL_TABLET | Freq: Three times a day (TID) | ORAL | 0 refills | Status: DC | PRN

## 2023-02-16 NOTE — Telephone Encounter (Signed)
Please advice

## 2023-02-16 NOTE — Addendum Note (Signed)
Addended by: Christel Mormon E on: 02/16/2023 11:39 AM   Modules accepted: Orders

## 2023-02-19 ENCOUNTER — Ambulatory Visit (HOSPITAL_COMMUNITY): Payer: Medicaid Other

## 2023-02-19 DIAGNOSIS — M436 Torticollis: Secondary | ICD-10-CM | POA: Diagnosis not present

## 2023-02-19 DIAGNOSIS — M542 Cervicalgia: Secondary | ICD-10-CM | POA: Diagnosis not present

## 2023-02-19 DIAGNOSIS — S161XXA Strain of muscle, fascia and tendon at neck level, initial encounter: Secondary | ICD-10-CM | POA: Diagnosis not present

## 2023-02-19 NOTE — Telephone Encounter (Signed)
 Pt informed

## 2023-02-19 NOTE — Therapy (Signed)
OUTPATIENT PHYSICAL THERAPY CERVICAL TREATMENT    Patient Name: [name] MRN: [id number] DOB:Feb 27, 1960, 63 y.o., female Today's Date: 02/19/2023  END OF SESSION:  PT End of Session - 02/19/23 1155     Visit Number 2    Number of Visits 6    Date for PT Re-Evaluation 03/09/23    Authorization Type [place] Medicaid Prepaid    PT Start Time 1155   late check in   PT Stop Time 1230    PT Time Calculation (min) 35 min    Activity Tolerance Patient tolerated treatment well    Behavior During Therapy WFL for tasks assessed/performed             Past Medical History:  Diagnosis Date   Arthritis    Hypothyroidism    Past Surgical History:  Procedure Laterality Date   APPENDECTOMY     Bilateral bunionectomy     COLONOSCOPY N/A 01/01/2015   Procedure: COLONOSCOPY;  Surgeon: West Bali, MD;  [facility]: [facility];  Service: Endoscopy;  Laterality: N/A;  11:15 AM - pt knows to arrive at 11:15   LID LESION EXCISION Right 12/29/2020   Procedure: LID LESION EXCISION;  Surgeon: [name], MD;  Location: [facility];  Service: Ophthalmology;  Laterality: Right;  LOCAL   THYROIDECTOMY N/A 08/15/2013   Procedure: TOTAL THYROIDECTOMY;  Surgeon: [name], MD;  Location: [facility];  Service: General;  Laterality: N/A;   Patient Active Problem List   Diagnosis Date Noted   Strain of cervical portion of left trapezius muscle 02/14/2023   Anxiety 02/14/2023   Adult hypothyroidism 02/07/2023   Benign essential HTN 02/07/2023   HLD (hyperlipidemia) 02/07/2023   Goiter, nontoxic, 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   Neck pain 02/02/2023   Cervical pain (neck) 01/31/2023   Tingling in extremities 01/31/2023   Encounter for examination following treatment at hospital 01/31/2023   Tobacco use 10/11/2021   Encounter to establish care 10/11/2021   Prediabetes 03/31/2019   Hyperlipidemia 09/23/2015   Postsurgical hypothyroidism  03/18/2015   Special screening for malignant neoplasms, colon    S/P thyroidectomy 08/15/2013    PCP: Delmar Landau, MD  REFERRING PROVIDER: Billie Lade, MD  REFERRING DIAG: S16.1XXA (ICD-10-CM) - Strain of cervical portion of left trapezius muscle  THERAPY DIAG:  Neck stiffness  Neck pain  Rationale for Evaluation and Treatment: Rehabilitation  ONSET DATE: 3 1/2 weeks ago  SUBJECTIVE:  SUBJECTIVE STATEMENT: Felt really good over the weekend but started hurting while shopping at [place]; was carrying merchandise in her left arm.  Today she is having some pain and pressure in her neck and shoulders and upper traps.  She did take 2 ibuprofen and that eased it up some.  4-5/10 pain on arrival  EVAL:Woke up one morning; had neck pain thought she slept wrong; got better but still feels pressure when moving her neck.  Later her face started getting numb so next day went to ER; diagnosed with salivary gland stone; but that was gone; so later went to Urgent Care and they flushed out x 6 left ear; wax and gave nasal spray and using warm compresses so that is now gone.  Still has a little tightness in her neck.   Hand dominance: Left  PERTINENT HISTORY:  TMJ anxiety  PAIN:  Are you having pain? Yes: NPRS scale: 1/10 Pain location: left side of neck Pain description: tight Aggravating factors: moving, turning her head Relieving factors: intermittent, heat, tylenol and ibuprofen  PRECAUTIONS: None   WEIGHT BEARING RESTRICTIONS: No  FALLS:  Has patient fallen in last 6 months? No  OCCUPATION: not working  PLOF: Independent  PATIENT GOALS: not feel stress on my neck  NEXT MD VISIT: PRN  OBJECTIVE:  Note: Objective measures were completed at Evaluation unless otherwise  noted.  DIAGNOSTIC FINDINGS:    PATIENT SURVEYS:  NDI 14/50 28%  COGNITION: Overall cognitive status: Within functional limits for tasks assessed  SENSATION: denies  POSTURE: rounded shoulders and forward head  PALPATION: Left upper trap and levator tighter than right   CERVICAL ROM:   Active ROM AROM (deg) eval  Flexion 20  Extension 25  Right lateral flexion 28  Left lateral flexion 30  Right rotation 40  Left rotation 43*   (Blank rows = not tested)  UPPER EXTREMITY ROM:  Active ROM Right eval Left eval  Shoulder flexion wfl wfl  Shoulder extension    Shoulder abduction    Shoulder adduction    Shoulder extension    Shoulder internal rotation    Shoulder external rotation    Elbow flexion    Elbow extension    Wrist flexion    Wrist extension    Wrist ulnar deviation    Wrist radial deviation    Wrist pronation    Wrist supination     (Blank rows = not tested)  UPPER EXTREMITY MMT:  MMT Right eval Left eval  Shoulder flexion 5 5  Shoulder extension    Shoulder abduction 5 5  Shoulder adduction    Shoulder extension    Shoulder internal rotation    Shoulder external rotation    Middle trapezius    Lower trapezius    Elbow flexion    Elbow extension    Wrist flexion    Wrist extension    Wrist ulnar deviation    Wrist radial deviation    Wrist pronation    Wrist supination    Grip strength     (Blank rows = not tested)   TREATMENT DATE:  02/19/23 Review of HEP and goals; moist heat in supine while reviewing goals STM to cervical spine, upper traps and levator x 10' to decrease pain and increase soft tissue extensibility followed by gentle manual traction 10" holds x 10 to improve joint mobility of the cervical spine Supine Cervical retractions x 10 Cervical rotation x 5 each Seated: Upper trap stretch 3 x 20" Levator  stretch 3 x 20" Scapular retraction 5" hold x 10      02/15/23 physical therapy evaluation and HEP  instruction  ;  Moist heat to left upper trap x 5' STM to left upper trap and cervical paraspinals to decrease tissue tightness and improve cervical mobility                                                                                                                             PATIENT EDUCATION:  Education details: Patient educated on exam findings, POC, scope of PT, HEP, and what to expect next visit. Person educated: Patient Education method: Explanation, Demonstration, and Handouts Education comprehension: verbalized understanding, returned demonstration, verbal cues required, and tactile cues required  HOME EXERCISE PROGRAM: 02/19/23 scapular retractions, cervical retractions and rotations Access Code: [email or web address] URL: [email or web address] Date: 02/15/2023 Prepared by: [facility]  Exercises - Seated Upper Trapezius Stretch  - 1 x daily - 7 x weekly - 1 sets - 3 reps - Seated Levator Scapulae Stretch  - 1 x daily - 7 x weekly - 1 sets - 3 reps  ASSESSMENT:  CLINICAL IMPRESSION: Today's session started with a review of HEP and goals while patient is on moist heat.  Patient verbalizes agreement with set rehab goals.  STM and manual traction to decrease pain and improve soft tissue and joint extensibility.  Noted trigger points in right side > left today.  Added cervical retractions and postural strengthening today and updated HEP.  Decreased pain to 2/10 at the end of treatment.  Patient will benefit from continued skilled therapy services  to address deficits and promote return to optimal function.       Eval:Patient is a 63 y.o. female who was seen today for physical therapy evaluation and treatment for S16.1XXA (ICD-10-CM) - Strain of cervical portion of left trapezius muscle.  Patient demonstrates decreased strength, ROM restriction, reduced flexibility, increased tenderness to palpation and postural abnormalities which are likely contributing to symptoms of pain and  are negatively impacting patient ability to perform ADLs. Patient will benefit from skilled physical therapy services to address these deficits to reduce pain and improve level of function with ADLs   OBJECTIVE IMPAIRMENTS: decreased activity tolerance, decreased mobility, decreased ROM, increased fascial restrictions, impaired perceived functional ability, and pain.   ACTIVITY LIMITATIONS: lifting, bending, sleeping, and dressing  PARTICIPATION LIMITATIONS: meal prep, cleaning, and laundry  REHAB POTENTIAL: Good  CLINICAL DECISION MAKING: Evolving/moderate complexity  EVALUATION COMPLEXITY: Moderate   GOALS: Goals reviewed with patient? No  SHORT TERM GOALS: Target date: 03/02/23  patient will be independent with initial HEP  Baseline:  Goal status: in progress  2.  Patient will increase cervical rotation by 20 degrees total to improve ability to scan for safety with driving Baseline: see above Goal status: in progress   LONG TERM GOALS: Target date: 03/09/2023  Patient will be independent in self management strategies to improve quality of life and functional outcomes.  Baseline:  Goal status:in progress  2.  Patient will report 70% improvement overall  Baseline:  Goal status: in progress  3.  Patient will improve NDI score by 4 points (10/50 or less) to demonstrate improved perceived function  Baseline: 14/50 Goal status: in progress  4.  Patient will improve total cervical mobility by 40 degrees to improve ability to scan environment for safety Baseline: see above Goal status: in progress  5.  Patient will be able to read or look at her phone for 10' without pain > 3/10 in the neck and upper trap Baseline:  Goal status: in  progress   PLAN:  PT FREQUENCY: 2x/week  PT DURATION: 3 weeks  PLANNED INTERVENTIONS: 97164- PT Re-evaluation, 97110-Therapeutic exercises, 97530- Therapeutic activity, 97112- Neuromuscular re-education, 97535- Self Care, 16109-  Manual therapy, [phone]- Gait training, [phone]- Orthotic Fit/training, [phone]- Canalith repositioning, [id number]- Aquatic Therapy, [phone]- Splinting, Patient/Family education, Balance training, Stair training, Taping, Dry Needling, Joint mobilization, Joint manipulation, Spinal manipulation, Spinal mobilization, Scar mobilization, and DME instructions.   PLAN FOR NEXT SESSION:  STM and moist heat as needed to left upper trap, cervical mobility and postural strengthening' cervical retractions   12:34 PM, 02/19/23 Clarity Ciszek Small Roshard Rezabek MPT [facility] [place] [phone] [phone]

## 2023-02-23 ENCOUNTER — Ambulatory Visit (HOSPITAL_COMMUNITY): Payer: Medicaid Other

## 2023-02-23 DIAGNOSIS — M542 Cervicalgia: Secondary | ICD-10-CM

## 2023-02-23 DIAGNOSIS — M436 Torticollis: Secondary | ICD-10-CM

## 2023-02-23 DIAGNOSIS — S161XXA Strain of muscle, fascia and tendon at neck level, initial encounter: Secondary | ICD-10-CM | POA: Diagnosis not present

## 2023-02-23 NOTE — Therapy (Signed)
OUTPATIENT PHYSICAL THERAPY CERVICAL TREATMENT    Patient Name: [name] MRN: [id number] DOB:Jan 22, 1960, 63 y.o., female Today's Date: 02/23/2023  END OF SESSION:  PT End of Session - 02/23/23 1340     Visit Number 3    Number of Visits 6    Date for PT Re-Evaluation 03/09/23    Authorization Type [place] Medicaid Prepaid    PT Start Time 1305    PT Stop Time 1340    PT Time Calculation (min) 35 min    Activity Tolerance Patient tolerated treatment well    Behavior During Therapy WFL for tasks assessed/performed              Past Medical History:  Diagnosis Date   Arthritis    Hypothyroidism    Past Surgical History:  Procedure Laterality Date   APPENDECTOMY     Bilateral bunionectomy     COLONOSCOPY N/A 01/01/2015   Procedure: COLONOSCOPY;  Surgeon: West Bali, MD;  [facility];  Service: Endoscopy;  Laterality: N/A;  11:15 AM - pt knows to arrive at 11:15   LID LESION EXCISION Right 12/29/2020   Procedure: LID LESION EXCISION;  Surgeon: [name], MD;  Location: [facility];  Service: Ophthalmology;  Laterality: Right;  LOCAL   THYROIDECTOMY N/A 08/15/2013   Procedure: TOTAL THYROIDECTOMY;  Surgeon: [name], MD;  Location: [facility];  Service: General;  Laterality: N/A;   Patient Active Problem List   Diagnosis Date Noted   Strain of cervical portion of left trapezius muscle 02/14/2023   Anxiety 02/14/2023   Adult hypothyroidism 02/07/2023   Benign essential HTN 02/07/2023   HLD (hyperlipidemia) 02/07/2023   Goiter, nontoxic, 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   Neck pain 02/02/2023   Cervical pain (neck) 01/31/2023   Tingling in extremities 01/31/2023   Encounter for examination following treatment at hospital 01/31/2023   Tobacco use 10/11/2021   Encounter to establish care 10/11/2021   Prediabetes 03/31/2019   Hyperlipidemia 09/23/2015   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    Special screening for malignant neoplasms, colon    S/P thyroidectomy 08/15/2013    PCP: Delmar Landau, MD  REFERRING PROVIDER: Billie Lade, MD  REFERRING DIAG: S16.1XXA (ICD-10-CM) - Strain of cervical portion of left trapezius muscle  THERAPY DIAG:  Neck stiffness  Neck pain  Rationale for Evaluation and Treatment: Rehabilitation  ONSET DATE: 3 1/2 weeks ago  SUBJECTIVE:  SUBJECTIVE STATEMENT: No pain as of this morning. Pt reporting just pressure like sensation, pt believes the HEP has been helping the most.   EVAL:Woke up one morning; had neck pain thought she slept wrong; got better but still feels pressure when moving her neck.  Later her face started getting numb so next day went to ER; diagnosed with salivary gland stone; but that was gone; so later went to Urgent Care and they flushed out x 6 left ear; wax and gave nasal spray and using warm compresses so that is now gone.  Still has a little tightness in her neck.   Hand dominance: Left  PERTINENT HISTORY:  TMJ anxiety  PAIN:  Are you having pain? Yes: NPRS scale: 1/10 Pain location: left side of neck Pain description: tight Aggravating factors: moving, turning her head Relieving factors: intermittent, heat, tylenol and ibuprofen  PRECAUTIONS: None   WEIGHT BEARING RESTRICTIONS: No  FALLS:  Has patient fallen in last 6 months? No  OCCUPATION: not working  PLOF: Independent  PATIENT GOALS: not feel stress on my neck  NEXT MD VISIT: PRN  OBJECTIVE:  Note: Objective measures were completed at Evaluation unless otherwise noted.  DIAGNOSTIC FINDINGS:    PATIENT SURVEYS:  NDI 14/50 28%  COGNITION: Overall cognitive status: Within functional limits for tasks assessed  SENSATION: denies  POSTURE:  rounded shoulders and forward head  PALPATION: Left upper trap and levator tighter than right   CERVICAL ROM:   Active ROM AROM (deg) eval  Flexion 20  Extension 25  Right lateral flexion 28  Left lateral flexion 30  Right rotation 40  Left rotation 43*   (Blank rows = not tested)  UPPER EXTREMITY ROM:  Active ROM Right eval Left eval  Shoulder flexion wfl wfl  Shoulder extension    Shoulder abduction    Shoulder adduction    Shoulder extension    Shoulder internal rotation    Shoulder external rotation    Elbow flexion    Elbow extension    Wrist flexion    Wrist extension    Wrist ulnar deviation    Wrist radial deviation    Wrist pronation    Wrist supination     (Blank rows = not tested)  UPPER EXTREMITY MMT:  MMT Right eval Left eval  Shoulder flexion 5 5  Shoulder extension    Shoulder abduction 5 5  Shoulder adduction    Shoulder extension    Shoulder internal rotation    Shoulder external rotation    Middle trapezius    Lower trapezius    Elbow flexion    Elbow extension    Wrist flexion    Wrist extension    Wrist ulnar deviation    Wrist radial deviation    Wrist pronation    Wrist supination    Grip strength     (Blank rows = not tested)   TREATMENT DATE:  02/23/2023  -3 x 30'' Upper trapezius stretch- cues for sustained holds and intensity with lengthening of UT. Completely bilatearlly.  -3x30' Levator Scapulae stretch -Seated cervical retraction  with RTB x 10  -Standing shoulder horizontal abduction with RTB and tactile cues for scapular retraction and depression and out of shoulder shrug x 10 with 3'' -Standing, towel slide open book on wall with kickball held between hip and wall, 1 set of 5 reps bilaterally. -Standing, bilateral shoulder ER with RTB, 2 set of 10 reps  02/19/23 Review of HEP and goals; moist heat in  supine while reviewing goals STM to cervical spine, upper traps and levator x 10' to decrease pain and increase  soft tissue extensibility followed by gentle manual traction 10" holds x 10 to improve joint mobility of the cervical spine Supine Cervical retractions x 10 Cervical rotation x 5 each Seated: Upper trap stretch 3 x 20" Levator stretch 3 x 20" Scapular retraction 5" hold x 10    02/15/23 physical therapy evaluation and HEP instruction  ;  Moist heat to left upper trap x 5' STM to left upper trap and cervical paraspinals to decrease tissue tightness and improve cervical mobility                                                                                                                             PATIENT EDUCATION:  Education details: Patient educated on exam findings, POC, scope of PT, HEP, and what to expect next visit. Person educated: Patient Education method: Explanation, Demonstration, and Handouts Education comprehension: verbalized understanding, returned demonstration, verbal cues required, and tactile cues required  HOME EXERCISE PROGRAM: 02/19/23 scapular retractions, cervical retractions and rotations Access Code: [email or web address] URL: [email or web address] Date: 02/15/2023 Prepared by: [facility]  Exercises - Seated Upper Trapezius Stretch  - 1 x daily - 7 x weekly - 1 sets - 3 reps - Seated Levator Scapulae Stretch  - 1 x daily - 7 x weekly - 1 sets - 3 reps  ASSESSMENT:  CLINICAL IMPRESSION: Pt tolerating therapy session well. Pt showing throughout interventions increased scapular elevation preference initially during movement. Pt demonstrating great carryover with tactile cues with multiple repetitions. Pt is showing great pain reduction this session and continuing to show improvements. Pt to focus on postural endurance and continual cervical strengthening. . Patient will benefit from continued skilled therapy services  to address deficits and promote return to optimal function.       Eval:Patient is a 63 y.o. female who was seen today for physical  therapy evaluation and treatment for S16.1XXA (ICD-10-CM) - Strain of cervical portion of left trapezius muscle.  Patient demonstrates decreased strength, ROM restriction, reduced flexibility, increased tenderness to palpation and postural abnormalities which are likely contributing to symptoms of pain and are negatively impacting patient ability to perform ADLs. Patient will benefit from skilled physical therapy services to address these deficits to reduce pain and improve level of function with ADLs   OBJECTIVE IMPAIRMENTS: decreased activity tolerance, decreased mobility, decreased ROM, increased fascial restrictions, impaired perceived functional ability, and pain.   ACTIVITY LIMITATIONS: lifting, bending, sleeping, and dressing  PARTICIPATION LIMITATIONS: meal prep, cleaning, and laundry  REHAB POTENTIAL: Good  CLINICAL DECISION MAKING: Evolving/moderate complexity  EVALUATION COMPLEXITY: Moderate   GOALS: Goals reviewed with patient? No  SHORT TERM GOALS: Target date: 03/02/23  patient will be independent with initial HEP  Baseline:  Goal status: in progress  2.  Patient will increase cervical rotation by 20 degrees total to improve ability to scan for safety  with driving Baseline: see above Goal status: in progress   LONG TERM GOALS: Target date: 03/09/2023  Patient will be independent in self management strategies to improve quality of life and functional outcomes.  Baseline:  Goal status:in progress  2.  Patient will report 70% improvement overall  Baseline:  Goal status: in progress  3.  Patient will improve NDI score by 4 points (10/50 or less) to demonstrate improved perceived function  Baseline: 14/50 Goal status: in progress  4.  Patient will improve total cervical mobility by 40 degrees to improve ability to scan environment for safety Baseline: see above Goal status: in progress  5.  Patient will be able to read or look at her phone for 10' without  pain > 3/10 in the neck and upper trap Baseline:  Goal status: in  progress   PLAN:  PT FREQUENCY: 2x/week  PT DURATION: 3 weeks  PLANNED INTERVENTIONS: 97164- PT Re-evaluation, 97110-Therapeutic exercises, 97530- Therapeutic activity, 97112- Neuromuscular re-education, 97535- Self Care, 16109- Manual therapy, [phone]- Gait training, [phone]- Orthotic Fit/training, [phone]- Canalith repositioning, [id number]- Aquatic Therapy, [phone]- Splinting, Patient/Family education, Balance training, Stair training, Taping, Dry Needling, Joint mobilization, Joint manipulation, Spinal manipulation, Spinal mobilization, Scar mobilization, and DME instructions.   PLAN FOR NEXT SESSION:  STM and moist heat as needed to left upper trap, cervical mobility and postural strengthening' cervical retractions   1:45 PM, 02/23/23 Nelida Meuse PT, DPT [facility] [place] [address] [phone] office

## 2023-02-26 ENCOUNTER — Other Ambulatory Visit: Payer: Self-pay | Admitting: Internal Medicine

## 2023-02-26 DIAGNOSIS — F419 Anxiety disorder, unspecified: Secondary | ICD-10-CM

## 2023-02-27 ENCOUNTER — Telehealth: Payer: Self-pay

## 2023-02-27 ENCOUNTER — Ambulatory Visit (HOSPITAL_COMMUNITY): Payer: Medicaid Other

## 2023-02-27 ENCOUNTER — Other Ambulatory Visit: Payer: Self-pay | Admitting: Internal Medicine

## 2023-02-27 ENCOUNTER — Encounter (HOSPITAL_COMMUNITY): Payer: Self-pay

## 2023-02-27 DIAGNOSIS — S161XXA Strain of muscle, fascia and tendon at neck level, initial encounter: Secondary | ICD-10-CM | POA: Diagnosis not present

## 2023-02-27 DIAGNOSIS — M436 Torticollis: Secondary | ICD-10-CM | POA: Diagnosis not present

## 2023-02-27 DIAGNOSIS — M542 Cervicalgia: Secondary | ICD-10-CM | POA: Diagnosis not present

## 2023-02-27 DIAGNOSIS — F419 Anxiety disorder, unspecified: Secondary | ICD-10-CM

## 2023-02-27 NOTE — Therapy (Signed)
 OUTPATIENT PHYSICAL THERAPY CERVICAL TREATMENT    Patient Name: [name] MRN: [id number] DOB:18-Aug-1960, 63 y.o., female Today's Date: 02/27/2023  END OF SESSION:  PT End of Session - 02/27/23 1515     Visit Number 4    Number of Visits 6    Date for PT Re-Evaluation 03/09/23    Authorization Type [place] Medicaid Prepaid    Progress Note Due on Visit 10    PT Start Time 1516    PT Stop Time 1604    PT Time Calculation (min) 48 min    Activity Tolerance Patient tolerated treatment well;No increased pain    Behavior During Therapy WFL for tasks assessed/performed              Past Medical History:  Diagnosis Date   Arthritis    Hypothyroidism    Past Surgical History:  Procedure Laterality Date   APPENDECTOMY     Bilateral bunionectomy     COLONOSCOPY N/A 01/01/2015   Procedure: COLONOSCOPY;  Surgeon: West Bali, MD;  [facility]: [facility];  Service: Endoscopy;  Laterality: N/A;  11:15 AM - pt knows to arrive at 11:15   LID LESION EXCISION Right 12/29/2020   Procedure: LID LESION EXCISION;  Surgeon: [name], MD;  Location: [facility];  Service: Ophthalmology;  Laterality: Right;  LOCAL   THYROIDECTOMY N/A 08/15/2013   Procedure: TOTAL THYROIDECTOMY;  Surgeon: [name], MD;  Location: [facility];  Service: General;  Laterality: N/A;   Patient Active Problem List   Diagnosis Date Noted   Strain of cervical portion of left trapezius muscle 02/14/2023   Anxiety 02/14/2023   Adult hypothyroidism 02/07/2023   Benign essential HTN 02/07/2023   HLD (hyperlipidemia) 02/07/2023   Goiter, nontoxic, 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   Neck pain 02/02/2023   Cervical pain (neck) 01/31/2023   Tingling in extremities 01/31/2023   Encounter for examination following treatment at hospital 01/31/2023   Tobacco use 10/11/2021   Encounter to establish care 10/11/2021   Prediabetes 03/31/2019   Hyperlipidemia  09/23/2015   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 Pt took 2 tylenols 2 times per day and hydroxyzine from doc. Pt reports that heat has been helpful pain management. Pt states that cervical retraction has been causing some "pressure" in her neck region.   EVAL:Woke up one morning; had neck pain thought she slept wrong; got better but still feels pressure when moving her neck.  Later her face started getting numb so next day went to ER; diagnosed with salivary gland stone; but that was gone; so later went to Urgent Care and they flushed out x 6 left ear; wax and gave nasal spray and using warm compresses so that is now gone.  Still has a little tightness in her neck.   Hand dominance: Left  PERTINENT HISTORY:  TMJ anxiety  PAIN:  Are you having pain? Yes, 1/10 reported today  PRECAUTIONS: None   WEIGHT BEARING RESTRICTIONS: No  FALLS:  Has patient fallen in last 6 months? No  OCCUPATION: not working  PLOF: Independent  PATIENT GOALS: not feel stress on my neck  NEXT MD VISIT: PRN  OBJECTIVE:  Note: Objective measures were completed at Evaluation unless otherwise noted.  DIAGNOSTIC FINDINGS:    PATIENT SURVEYS:  NDI 14/50 28%  COGNITION: Overall cognitive status: Within functional  limits for tasks assessed  SENSATION: denies  POSTURE: rounded shoulders and forward head  PALPATION: Left upper trap and levator tighter than right   CERVICAL ROM:   Active ROM AROM (deg) eval  Flexion 20  Extension 25  Right lateral flexion 28  Left lateral flexion 30  Right rotation 40  Left rotation 43*   (Blank rows = not tested)  UPPER EXTREMITY ROM:  Active ROM Right eval Left eval  Shoulder flexion wfl wfl  Shoulder extension    Shoulder abduction    Shoulder adduction    Shoulder extension    Shoulder internal rotation    Shoulder external rotation    Elbow flexion    Elbow extension    Wrist flexion    Wrist extension    Wrist ulnar deviation    Wrist radial deviation    Wrist pronation    Wrist supination     (Blank rows = not tested)  UPPER EXTREMITY MMT:  MMT Right eval Left eval  Shoulder flexion 5 5  Shoulder extension    Shoulder abduction 5 5  Shoulder adduction    Shoulder extension    Shoulder internal rotation    Shoulder external rotation    Middle trapezius    Lower trapezius    Elbow flexion    Elbow extension    Wrist flexion    Wrist extension    Wrist ulnar deviation    Wrist radial deviation    Wrist pronation    Wrist supination    Grip strength     (Blank rows = not tested)   TREATMENT DATE:  02/27/2023  MHP in supine, cervical spine and upper trap region, 6 min  Manual Therapy:  Cervical PROM, rotation and lateral flexion with end range holds Supine segmental lateral flexion/rotations with grade 2-3 for 4 minutes each, bilaterally Cervical spine distraction with fingertips in suboccipital region, 3 reps, 10 second on/off holds  Therapeutic Exercise: -Supine cervical rotation AROM, 1 set of 10 reps -Side lying open book 1 set of 8 reps bilaterally, Pt cued for maximal rotation and thoracic involvement  -Seated shoulder horizontal abduction with RTB, verbal  and tactile cues for scapular retraction and  depression, 10 reps of horizontal, 5 reps each diagonal -1x 30'' Upper trapezius stretch- cues for sustained holds and intensity with lengthening of UT. -1x30' Levator Scapulae stretch  Neuromuscular Re-education: -Seated cervical retraction with no resistance (painful last time)  -Shoulder Bracing 1 set of 10 reps, pt cued for importance of scapular retraction and including chin tuck if possible    02/23/2023  -3 x 30'' Upper trapezius stretch- cues for sustained holds and intensity with lengthening of UT. Completely bilatearlly.  -3x30' Levator Scapulae stretch -Seated cervical retraction  with RTB x 10  -Standing shoulder horizontal abduction with RTB and tactile cues for scapular retraction and depression and out of shoulder shrug x 10 with 3'' -Standing, towel slide open book on wall with kickball held between hip and wall, 1 set of 5 reps bilaterally. -Standing, bilateral shoulder ER with RTB, 2 set of 10 reps  02/19/23 Review of HEP and goals; moist heat in supine while reviewing goals STM to cervical spine, upper traps and levator x 10' to decrease pain and increase soft tissue extensibility followed by gentle manual traction 10" holds x 10 to improve joint mobility of the cervical spine Supine Cervical retractions x 10 Cervical rotation x 5 each Seated: Upper trap stretch 3 x 20" Levator stretch 3 x 20" Scapular retraction 5" hold x 10                                                                                                                    PATIENT EDUCATION:  Education details: Patient educated on POC and ideology behind current modalities and exercises. Pt was also educated on the importance of maintaining good posture and was given strategies to implement these posture corrections throughout her day. Person educated: Patient Education method: Explanation, Demonstration Education comprehension: verbalized understanding, returned demonstration, verbal cues required,  and tactile cues required  HOME EXERCISE PROGRAM: 02/19/23 scapular retractions, cervical retractions and rotations Access Code: [email or web address] URL: [email or web address] Date: 02/15/2023 Prepared by: [facility]  Exercises - Seated Upper Trapezius Stretch  - 1 x daily - 7 x weekly - 1 sets - 3 reps - Seated Levator Scapulae Stretch  - 1 x daily - 7 x weekly - 1 sets - 3 reps  ASSESSMENT:  CLINICAL IMPRESSION: Pt able to complete cervical retraction activity with increased proficiency today. Resistance was regressed with cervical retraction today as pt reports increased soreness following previous session. Consider progressing back to RTB next session as tolerated. Pt responds well to MHP and manual therapy, will plan to work toward more independence of pain management with exercise and activities in the next couple of sessions. Patient would continue to benefit from skilled physical therapy for increased cervical ROM, decreased neck pain, and continued progress towards therapy goals.   Eval:Patient is a 63 y.o. female who was seen today for physical therapy evaluation and treatment for S16.1XXA (ICD-10-CM) - Strain of cervical portion of left trapezius muscle.  Patient demonstrates  decreased strength, ROM restriction, reduced flexibility, increased tenderness to palpation and postural abnormalities which are likely contributing to symptoms of pain and are negatively impacting patient ability to perform ADLs. Patient will benefit from skilled physical therapy services to address these deficits to reduce pain and improve level of function with ADLs   OBJECTIVE IMPAIRMENTS: decreased activity tolerance, decreased mobility, decreased ROM, increased fascial restrictions, impaired perceived functional ability, and pain.   ACTIVITY LIMITATIONS: lifting, bending, sleeping, and dressing  PARTICIPATION LIMITATIONS: meal prep, cleaning, and laundry  REHAB POTENTIAL: Good  CLINICAL DECISION  MAKING: Evolving/moderate complexity  EVALUATION COMPLEXITY: Moderate   GOALS: Goals reviewed with patient? No  SHORT TERM GOALS: Target date: 03/02/23  patient will be independent with initial HEP  Baseline:  Goal status: in progress  2.  Patient will increase cervical rotation by 20 degrees total to improve ability to scan for safety with driving Baseline: see above Goal status: in progress   LONG TERM GOALS: Target date: 03/09/2023  Patient will be independent in self management strategies to improve quality of life and functional outcomes.  Baseline:  Goal status:in progress  2.  Patient will report 70% improvement overall  Baseline:  Goal status: in progress  3.  Patient will improve NDI score by 4 points (10/50 or less) to demonstrate improved perceived function  Baseline: 14/50 Goal status: in progress  4.  Patient will improve total cervical mobility by 40 degrees to improve ability to scan environment for safety Baseline: see above Goal status: in progress  5.  Patient will be able to read or look at her phone for 10' without pain > 3/10 in the neck and upper trap Baseline:  Goal status: in  progress   PLAN:  PT FREQUENCY: 2x/week  PT DURATION: 3 weeks  PLANNED INTERVENTIONS: 97164- PT Re-evaluation, 97110-Therapeutic exercises, 97530- Therapeutic activity, 97112- Neuromuscular re-education, 97535- Self Care, 16109- Manual therapy, [phone]- Gait training, [phone]- Orthotic Fit/training, [phone]- Canalith repositioning, [id number]- Aquatic Therapy, [phone]- Splinting, Patient/Family education, Balance training, Stair training, Taping, Dry Needling, Joint mobilization, Joint manipulation, Spinal manipulation, Spinal mobilization, Scar mobilization, and DME instructions.   PLAN FOR NEXT SESSION:  STM, cervical spine mobilization and moist heat as needed to left upper trap, cervical mobility and postural strengthening and education. Pt heading out of town later this week  and will not be back until March 3rd.  4:57 PM, 02/27/23 Luz Lex, PT, DPT

## 2023-02-27 NOTE — Telephone Encounter (Signed)
 Copied from CRM [id number]. Topic: Clinical - Prescription Issue >> Feb 27, 2023 10:35 AM Martha Clan wrote: Reason for CRM: hydrOXYzine (ATARAX) 10 MG tablet [Pharmacy Med Name: hydroxyzine HCl 10 mg tablet] [[id number]] Patient is requesting a refill of prescription. Pharmacy cancelled the 25mg  dosing, made patient feel drowsy.

## 2023-02-27 NOTE — Telephone Encounter (Signed)
 Refill sent to pharmacy.

## 2023-03-01 ENCOUNTER — Encounter (HOSPITAL_COMMUNITY): Payer: Medicaid Other | Admitting: Physical Therapy

## 2023-03-06 ENCOUNTER — Ambulatory Visit (HOSPITAL_COMMUNITY): Payer: Medicaid Other | Attending: Internal Medicine

## 2023-03-06 DIAGNOSIS — M542 Cervicalgia: Secondary | ICD-10-CM | POA: Diagnosis not present

## 2023-03-06 DIAGNOSIS — M436 Torticollis: Secondary | ICD-10-CM | POA: Insufficient documentation

## 2023-03-06 NOTE — Therapy (Signed)
 OUTPATIENT PHYSICAL THERAPY CERVICAL TREATMENT    Patient Name: [name] MRN: [id number] DOB:09-26-1960, 63 y.o., female Today's Date: 03/06/2023  END OF SESSION:  PT End of Session - 03/06/23 1354     Visit Number 5    Number of Visits 6    Date for PT Re-Evaluation 03/09/23    Authorization Type [place] Medicaid Prepaid    Authorization Time Period healthy [employer] approved 5 visits 2/13 to 4/13    Authorization - Visit Number 4    Progress Note Due on Visit 10    PT Start Time 0150    PT Stop Time 0230    PT Time Calculation (min) 40 min    Activity Tolerance Patient tolerated treatment well;No increased pain    Behavior During Therapy WFL for tasks assessed/performed              Past Medical History:  Diagnosis Date   Arthritis    Hypothyroidism    Past Surgical History:  Procedure Laterality Date   APPENDECTOMY     Bilateral bunionectomy     COLONOSCOPY N/A 01/01/2015   Procedure: COLONOSCOPY;  Surgeon: West Bali, MD;  [facility];  Service: Endoscopy;  Laterality: N/A;  11:15 AM - pt knows to arrive at 11:15   LID LESION EXCISION Right 12/29/2020   Procedure: LID LESION EXCISION;  Surgeon: [name], MD;  Location: [facility];  Service: Ophthalmology;  Laterality: Right;  LOCAL   THYROIDECTOMY N/A 08/15/2013   Procedure: TOTAL THYROIDECTOMY;  Surgeon: [name], MD;  Location: [facility];  Service: General;  Laterality: N/A;   Patient Active Problem List   Diagnosis Date Noted   Strain of cervical portion of left trapezius muscle 02/14/2023   Anxiety 02/14/2023   Adult hypothyroidism 02/07/2023   Benign essential HTN 02/07/2023   HLD (hyperlipidemia) 02/07/2023   Goiter, nontoxic, 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   Neck pain 02/02/2023   Cervical pain (neck) 01/31/2023   Tingling in extremities 01/31/2023   Encounter for examination following treatment at hospital 01/31/2023    Tobacco use 10/11/2021   Encounter to establish care 10/11/2021   Prediabetes 03/31/2019   Hyperlipidemia 09/23/2015   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  Later her face started getting numb so next day went to ER; diagnosed with salivary gland stone; but that was gone; so later went to Urgent Care and they flushed out x 6 left ear; wax and gave nasal spray and using warm compresses so that is now gone.  Still has a little tightness in her neck.   Hand dominance: Left  PERTINENT HISTORY:  TMJ anxiety  PAIN:  Are you having pain? Yes, 1/10 reported today  PRECAUTIONS: None   WEIGHT BEARING RESTRICTIONS: No  FALLS:  Has patient fallen in last 6 months? No  OCCUPATION: not working  PLOF: Independent  PATIENT GOALS: not feel stress on my neck  NEXT MD VISIT: PRN  OBJECTIVE:  Note: Objective measures were completed at Evaluation unless otherwise noted.  DIAGNOSTIC FINDINGS:    PATIENT SURVEYS:  NDI 14/50 28%  COGNITION: Overall cognitive status: Within functional limits for tasks assessed  SENSATION: denies  POSTURE: rounded shoulders and forward  head  PALPATION: Left upper trap and levator tighter than right   CERVICAL ROM:   Active ROM AROM (deg) eval  Flexion 20  Extension 25  Right lateral flexion 28  Left lateral flexion 30  Right rotation 40  Left rotation 43*   (Blank rows = not tested)  UPPER EXTREMITY ROM:  Active ROM Right eval Left eval  Shoulder flexion wfl wfl  Shoulder extension    Shoulder abduction    Shoulder adduction    Shoulder extension    Shoulder internal rotation    Shoulder external rotation    Elbow flexion    Elbow extension    Wrist flexion    Wrist extension    Wrist ulnar deviation    Wrist radial deviation    Wrist pronation    Wrist supination     (Blank rows = not tested)  UPPER EXTREMITY MMT:  MMT Right eval Left eval  Shoulder flexion 5 5  Shoulder extension    Shoulder abduction 5 5  Shoulder adduction    Shoulder extension    Shoulder internal rotation    Shoulder external rotation    Middle trapezius    Lower trapezius    Elbow flexion    Elbow extension    Wrist flexion    Wrist extension    Wrist ulnar deviation    Wrist radial deviation    Wrist pronation    Wrist supination    Grip strength     (Blank rows = not tested)   TREATMENT DATE:  03/06/23 Supine: MHP cervical spine and upper trap region x 5 min Manual STM to bilateral upper trap and levator and c-spine paraspinals x 10' ; manual traction 10" x 10 Scapular retractions 5" x 10 Cervical retractions 5" x 10  UBE backwards 2'; forwards 2' Standing: Scapular retractions RTB 2 x 10 Shoulder extensions RTB 2 x 10 Updated HEP   02/27/23 MHP in supine, cervical spine and upper trap region, 6 min  Manual Therapy:  Cervical PROM, rotation and lateral flexion with end range holds Supine segmental lateral flexion/rotations with grade 2-3 for 4 minutes each, bilaterally Cervical spine distraction with fingertips in suboccipital region, 3 reps, 10 second on/off holds  Therapeutic  Exercise: -Supine cervical rotation AROM, 1 set of 10 reps -Side lying  open book 1 set of 8 reps bilaterally, Pt cued for maximal rotation and thoracic involvement  -Seated shoulder horizontal abduction with RTB, verbal and tactile cues for scapular retraction and depression, 10 reps of horizontal, 5 reps each diagonal -1x 30'' Upper trapezius stretch- cues for sustained holds and intensity with lengthening of UT. -1x30' Levator Scapulae stretch  Neuromuscular Re-education: -Seated cervical retraction with no resistance (painful last time)  -Shoulder Bracing 1 set of 10 reps, pt cued for importance of scapular retraction and including chin tuck if possible    02/23/2023  -3 x 30'' Upper trapezius stretch- cues for sustained holds and intensity with lengthening of UT. Completely bilatearlly.  -3x30' Levator Scapulae stretch -Seated cervical retraction  with RTB x 10  -Standing shoulder horizontal abduction with RTB and tactile cues for scapular retraction and depression and out of shoulder shrug x 10 with 3'' -Standing, towel slide open book on wall with kickball held between hip and wall, 1 set of 5 reps bilaterally. -Standing, bilateral shoulder ER with RTB, 2 set of 10 reps  02/19/23 Review of HEP and goals; moist heat in supine while reviewing goals STM to cervical spine, upper traps and levator x 10' to decrease pain and increase soft tissue extensibility followed by gentle manual traction 10" holds x 10 to improve joint mobility of the cervical spine Supine Cervical retractions x 10 Cervical rotation x 5 each Seated: Upper trap stretch 3 x 20" Levator stretch 3 x 20" Scapular retraction 5" hold x 10                                                                                                                    PATIENT EDUCATION:  Education details: Patient educated on POC and ideology behind current modalities and exercises. Pt was also educated on the importance of  maintaining good posture and was given strategies to implement these posture corrections throughout her day. Person educated: Patient Education method: Explanation, Demonstration Education comprehension: verbalized understanding, returned demonstration, verbal cues required, and tactile cues required  HOME EXERCISE PROGRAM: 02/19/23 scapular retractions, cervical retractions and rotations Access Code: [email or web address] URL: [email or web address] Date: 02/15/2023 Prepared by: [facility]  Exercises - Seated Upper Trapezius Stretch  - 1 x daily - 7 x weekly - 1 sets - 3 reps - Seated Levator Scapulae Stretch  - 1 x daily - 7 x weekly - 1 sets - 3 reps  ASSESSMENT:  CLINICAL IMPRESSION: Patient with subjective reports of decreased pain; noted today right upper trap tighter than left.  Progressed exercise with resistance to theraband exercise today without issue.  Issued theraband for home use and updated home program.  Will reassess next visit and likely ready for discharge.    Eval:Patient is a 63 y.o. female who was seen today for physical therapy evaluation and treatment for S16.1XXA (ICD-10-CM) - Strain of cervical portion of left trapezius muscle.  Patient demonstrates decreased strength, ROM restriction, reduced flexibility, increased tenderness to palpation and postural abnormalities which are  likely contributing to symptoms of pain and are negatively impacting patient ability to perform ADLs. Patient will benefit from skilled physical therapy services to address these deficits to reduce pain and improve level of function with ADLs   OBJECTIVE IMPAIRMENTS: decreased activity tolerance, decreased mobility, decreased ROM, increased fascial restrictions, impaired perceived functional ability, and pain.   ACTIVITY LIMITATIONS: lifting, bending, sleeping, and dressing  PARTICIPATION LIMITATIONS: meal prep, cleaning, and laundry  REHAB POTENTIAL: Good  CLINICAL DECISION MAKING:  Evolving/moderate complexity  EVALUATION COMPLEXITY: Moderate   GOALS: Goals reviewed with patient? No  SHORT TERM GOALS: Target date: 03/02/23  patient will be independent with initial HEP  Baseline:  Goal status: in progress  2.  Patient will increase cervical rotation by 20 degrees total to improve ability to scan for safety with driving Baseline: see above Goal status: in progress   LONG TERM GOALS: Target date: 03/09/2023  Patient will be independent in self management strategies to improve quality of life and functional outcomes.  Baseline:  Goal status:in progress  2.  Patient will report 70% improvement overall  Baseline:  Goal status: in progress  3.  Patient will improve NDI score by 4 points (10/50 or less) to demonstrate improved perceived function  Baseline: 14/50 Goal status: in progress  4.  Patient will improve total cervical mobility by 40 degrees to improve ability to scan environment for safety Baseline: see above Goal status: in progress  5.  Patient will be able to read or look at her phone for 10' without pain > 3/10 in the neck and upper trap Baseline:  Goal status: in  progress   PLAN:  PT FREQUENCY: 2x/week  PT DURATION: 3 weeks  PLANNED INTERVENTIONS: 97164- PT Re-evaluation, 97110-Therapeutic exercises, 97530- Therapeutic activity, 97112- Neuromuscular re-education, 97535- Self Care, 16109- Manual therapy, [phone]- Gait training, [phone]- Orthotic Fit/training, [phone]- Canalith repositioning, [id number]- Aquatic Therapy, [phone]- Splinting, Patient/Family education, Balance training, Stair training, Taping, Dry Needling, Joint mobilization, Joint manipulation, Spinal manipulation, Spinal mobilization, Scar mobilization, and DME instructions.   PLAN FOR NEXT SESSION:  STM, cervical spine mobilization and moist heat as needed to left upper trap, cervical mobility and postural strengthening and education.reassess next visit; likely ready for  discharge  2:33 PM, 03/06/23 Myka Hitz Small Trace Wirick MPT [facility] [place] [phone]

## 2023-03-08 ENCOUNTER — Ambulatory Visit (HOSPITAL_COMMUNITY): Payer: Medicaid Other

## 2023-03-08 DIAGNOSIS — M436 Torticollis: Secondary | ICD-10-CM

## 2023-03-08 DIAGNOSIS — M542 Cervicalgia: Secondary | ICD-10-CM | POA: Diagnosis not present

## 2023-03-08 NOTE — Therapy (Signed)
 OUTPATIENT PHYSICAL THERAPY CERVICAL PROGRESS/DISCHARGE  PHYSICAL THERAPY DISCHARGE SUMMARY  Visits from Start of Care: 5  Current functional level related to goals / functional outcomes: See below   Remaining deficits: See below   Education / Equipment: Pt was educated on progress seen in the last few sessions. PT reviewed HEP and added additional exercise for increased cervical rotation.  Patient agrees to discharge. Patient goals were met. Patient is being discharged due to  meeting 6/7 goals and no longer having debilitating neck pain.     Patient Name: QUANESHIA WAREING MRN: [id number] DOB:1960/05/28, 63 y.o., female Today's Date: 03/08/2023  END OF SESSION:  PT End of Session - 03/08/23 1347     Visit Number 6    Date for PT Re-Evaluation 03/09/23    Authorization Type [place] Medicaid Prepaid    Authorization Time Period healthy blue approved 5 visits 2/13 to 4/13    Authorization - Visit Number 5    Progress Note Due on Visit 10    PT Start Time 1346    PT Stop Time 1424    PT Time Calculation (min) 38 min    Activity Tolerance Patient tolerated treatment well;No increased pain    Behavior During Therapy WFL for tasks assessed/performed              Past Medical History:  Diagnosis Date   Arthritis    Hypothyroidism    Past Surgical History:  Procedure Laterality Date   APPENDECTOMY     Bilateral bunionectomy     COLONOSCOPY N/A 01/01/2015   Procedure: COLONOSCOPY;  Surgeon: West Bali, MD;  Location: [facility];  Service: Endoscopy;  Laterality: N/A;  11:15 AM - pt knows to arrive at 11:15   LID LESION EXCISION Right 12/29/2020   Procedure: LID LESION EXCISION;  Surgeon: [name], MD;  Location: [facility];  Service: Ophthalmology;  Laterality: Right;  LOCAL   THYROIDECTOMY N/A 08/15/2013   Procedure: TOTAL THYROIDECTOMY;  Surgeon: [name], MD;  Location: [facility];  Service: General;  Laterality: N/A;   Patient Active  Problem List   Diagnosis Date Noted   Strain of cervical portion of left trapezius muscle 02/14/2023   Anxiety 02/14/2023   Adult hypothyroidism 02/07/2023   Benign essential HTN 02/07/2023   HLD (hyperlipidemia) 02/07/2023   Goiter, nontoxic, 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   Neck pain 02/02/2023   Cervical pain (neck) 01/31/2023   Tingling in extremities 01/31/2023   Encounter for examination following treatment at hospital 01/31/2023   Tobacco use 10/11/2021   Encounter to establish care 10/11/2021   Prediabetes 03/31/2019   Hyperlipidemia 09/23/2015   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 Patient reports she feels about 90 % better. Pt states she feels comfortable to discharge to HEP at this time.  EVAL:Woke up one morning; had neck pain thought she slept wrong; got better but still feels pressure when moving her neck.  Later her face started getting numb so next day went to ER; diagnosed with salivary gland stone; but that was gone; so later went to Urgent Care and they flushed out x 6 left ear; wax and gave nasal spray and using warm compresses so that is now gone.  Still has a little tightness in her neck.   Hand dominance: Left  PERTINENT HISTORY:  TMJ anxiety  PAIN:  Are you having pain? Yes, 1/10  reported today  PRECAUTIONS: None   WEIGHT BEARING RESTRICTIONS: No  FALLS:  Has patient fallen in last 6 months? No  OCCUPATION: not working  PLOF: Independent  PATIENT GOALS: not feel stress on my neck  NEXT MD VISIT: PRN  OBJECTIVE:  Note: Objective measures were completed at Evaluation unless otherwise noted.  DIAGNOSTIC FINDINGS:    PATIENT SURVEYS:  NDI 14/50 28%  COGNITION: Overall cognitive status: Within functional limits for tasks assessed  SENSATION: denies  POSTURE: rounded shoulders and forward head  PALPATION: Left upper trap and levator tighter than right   CERVICAL ROM:   Active ROM AROM (deg) eval 03/08/23 Degree  Flexion 20 30  Extension 25 50  Right lateral flexion 28 35  Left lateral flexion 30 35  Right rotation 40 40  Left rotation 43* 49   (Blank rows = not tested)  UPPER EXTREMITY ROM:  Active ROM Right eval Left eval  Shoulder flexion wfl wfl  Shoulder extension    Shoulder abduction    Shoulder adduction    Shoulder extension    Shoulder internal rotation    Shoulder external rotation    Elbow flexion    Elbow extension    Wrist flexion    Wrist extension    Wrist ulnar deviation    Wrist radial deviation    Wrist pronation    Wrist supination     (Blank rows = not tested)  UPPER EXTREMITY MMT:  MMT Right eval Left eval  Shoulder flexion 5 5  Shoulder extension    Shoulder abduction 5 5  Shoulder adduction    Shoulder extension    Shoulder internal rotation    Shoulder external rotation    Middle trapezius    Lower trapezius    Elbow flexion    Elbow extension    Wrist flexion    Wrist extension    Wrist ulnar deviation    Wrist radial deviation    Wrist pronation    Wrist supination    Grip strength     (Blank rows = not tested)   TREATMENT DATE:  03/08/2023  Progress/discharge note (ROM, pt survey, HEP updates/review) Therapeutic Exercise: -Cervical rotation snag 1 set of 10 reps with 5-10  second holds -Chin tucks with scapular retraction with 5-10 second holds -RTB horizontal abductions and diagonal   03/06/23 Supine: MHP cervical spine and upper trap region x 5 min Manual STM to bilateral upper trap and levator and c-spine paraspinals x 10' ; manual traction 10" x 10 Scapular retractions 5" x 10 Cervical retractions 5" x 10  UBE backwards 2'; forwards 2' Standing: Scapular retractions RTB 2 x 10 Shoulder extensions RTB 2 x 10 Updated HEP   02/27/23 MHP in supine, cervical  spine and upper trap region, 6 min  Manual Therapy:  Cervical PROM, rotation and lateral flexion with end range holds Supine segmental lateral flexion/rotations with grade 2-3 for 4 minutes each, bilaterally Cervical spine distraction with fingertips in suboccipital region, 3 reps, 10 second on/off holds  Therapeutic Exercise: -Supine cervical rotation AROM, 1 set of 10 reps -Side lying open book 1 set of 8 reps bilaterally, Pt cued for maximal rotation and thoracic involvement  -Seated shoulder horizontal abduction with RTB, verbal and tactile cues for scapular retraction and depression, 10 reps of horizontal, 5 reps each diagonal -1x 30'' Upper trapezius stretch- cues for sustained holds and intensity with lengthening of UT. -1x30' Levator Scapulae stretch  Neuromuscular Re-education: -Seated cervical retraction with no resistance (painful last time)  -Shoulder Bracing 1 set of 10 reps, pt cued for importance of scapular retraction and including chin tuck if possible    02/23/2023  -3 x 30'' Upper trapezius stretch- cues for sustained holds and intensity with lengthening of UT. Completely bilatearlly.  -3x30' Levator Scapulae stretch -Seated cervical retraction  with RTB x 10  -Standing shoulder horizontal abduction with RTB and tactile cues for scapular retraction and depression and out of shoulder shrug x 10 with 3'' -Standing, towel slide open book on wall with kickball held between hip  and wall, 1 set of 5 reps bilaterally. -Standing, bilateral shoulder ER with RTB, 2 set of 10 reps  02/19/23 Review of HEP and goals; moist heat in supine while reviewing goals STM to cervical spine, upper traps and levator x 10' to decrease pain and increase soft tissue extensibility followed by gentle manual traction 10" holds x 10 to improve joint mobility of the cervical spine Supine Cervical retractions x 10 Cervical rotation x 5 each Seated: Upper trap stretch 3 x 20" Levator stretch 3 x 20" Scapular retraction 5" hold x 10                                                                                                                    PATIENT EDUCATION:  Education details: Patient educated on POC and ideology behind current modalities and exercises. Pt was also educated on the importance of maintaining good posture and was given strategies to implement these posture corrections throughout her day. Person educated: Patient Education method: Explanation, Demonstration Education comprehension: verbalized understanding, returned demonstration, verbal cues required, and tactile cues required  HOME EXERCISE PROGRAM: 02/19/23 scapular retractions, cervical retractions and rotations Access Code: [email or web address] URL: [email or web address] Date: 02/15/2023 Prepared by: [facility]  Exercises - Seated Upper Trapezius Stretch  - 1 x daily - 7 x weekly - 1 sets - 3 reps - Seated Levator Scapulae Stretch  - 1 x daily - 7 x weekly - 1 sets - 3 reps  ASSESSMENT:  CLINICAL IMPRESSION: Pt continues to demonstrate evidence of compliance with prescribed HEP. Pt has met 6/7 goals since the initial evaluation with good progress towards remaining goal. Pt was given additional HEP exercise cervical rotation SNAG  for increased R cervical rotation. Overall pt has demonstrated decreased pain during therapy sessions and has not been limited by her pain at home. Pt will be discharged at this time  to HEP.  Eval:Patient is a 63 y.o. female who was seen today for physical therapy evaluation and treatment for S16.1XXA (ICD-10-CM) - Strain of cervical portion of left trapezius muscle.  Patient demonstrates decreased strength, ROM restriction, reduced flexibility, increased tenderness to palpation and postural abnormalities which are likely contributing to symptoms of pain and are negatively impacting patient ability to perform ADLs. Patient will benefit from skilled physical therapy services to address these deficits to reduce pain and improve level of function with ADLs   OBJECTIVE IMPAIRMENTS: decreased activity tolerance, decreased mobility, decreased ROM, increased fascial restrictions, impaired perceived functional ability, and pain.   ACTIVITY LIMITATIONS: lifting, bending, sleeping, and dressing  PARTICIPATION LIMITATIONS: meal prep, cleaning, and laundry  REHAB POTENTIAL: Good  CLINICAL DECISION MAKING: Evolving/moderate complexity  EVALUATION COMPLEXITY: Moderate   GOALS: Goals reviewed with patient? No  SHORT TERM GOALS: Target date: 03/02/23  patient will be independent with initial HEP  Baseline:  Goal status: MET  2.  Patient will increase cervical rotation by 20 degrees total to improve ability to scan for safety with driving Baseline: see above Goal status: Partially met   LONG TERM GOALS: Target date: 03/09/2023  Patient will be independent in self management strategies to improve quality of life and functional outcomes.  Baseline:  Goal status:in progress  2.  Patient will report 70% improvement overall  Baseline:  Goal status: MET  3.  Patient will improve NDI score by 4 points (10/50 or less) to demonstrate improved perceived function  Baseline: 14/50, 03/08/23: 1/50 Goal status: MET  4.  Patient will improve total cervical mobility by 40 degrees to improve ability to scan environment for safety Baseline: see above Goal status: MET  5.  Patient  will be able to read or look at her phone for 10' without pain > 3/10 in the neck and upper trap Baseline:  Goal status: MET   PLAN:  PT FREQUENCY: 2x/week  PT DURATION: 3 weeks  PLANNED INTERVENTIONS: 97164- PT Re-evaluation, 97110-Therapeutic exercises, 97530- Therapeutic activity, 97112- Neuromuscular re-education, 97535- Self Care, 29562- Manual therapy, [phone]- Gait training, [phone]- Orthotic Fit/training, [phone]- Canalith repositioning, [id number]- Aquatic Therapy, [phone]- Splinting, Patient/Family education, Balance training, Stair training, Taping, Dry Needling, Joint mobilization, Joint manipulation, Spinal manipulation, Spinal mobilization, Scar mobilization, and DME instructions.   PLAN FOR NEXT SESSION:  Pt to be discharged at this time.  Luz Lex, PT, DPT [facility] Office: [phone]

## 2023-03-12 ENCOUNTER — Other Ambulatory Visit: Payer: Self-pay | Admitting: Internal Medicine

## 2023-03-12 MED ORDER — CETIRIZINE HCL 10 MG PO TABS
10.0000 mg | ORAL_TABLET | Freq: Every day | ORAL | 0 refills | Status: DC
Start: 2023-03-12 — End: 2023-04-24

## 2023-03-12 NOTE — Telephone Encounter (Signed)
 Copied from CRM [id number]. Topic: Clinical - Medication Refill >> Mar 12, 2023 12:32 PM Higinio Roger wrote: Most Recent Primary Care Visit:  Provider: Billie Lade  Department: [facility]  Visit Type: HOSPITAL FOLLOW UP  Date: 02/13/2023  Medication: cetirizine (ZYRTEC) 10 MG tablet.    Has the patient contacted their pharmacy? No (Agent: If no, request that the patient contact the pharmacy for the refill. If patient does not wish to contact the pharmacy document the reason why and proceed with request.) Patient has head cold and allergy like symptoms and would like this medication refilled  (Agent: If yes, when and what did the pharmacy advise?)  Is this the correct pharmacy for this prescription? Yes If no, delete pharmacy and type the correct one.  This is the patient's preferred pharmacy:   [facility] - [place], [place] - [address] [place] [address] Phone: [phone] Fax: [phone]  Has the prescription been filled recently? Yes  Is the patient out of the medication? Yes  Has the patient been seen for an appointment in the last year OR does the patient have an upcoming appointment? Yes  Can we respond through MyChart? No. Call [phone]  Agent: Please be advised that Rx refills may take up to 3 business days. We ask that you follow-up with your pharmacy.

## 2023-03-28 ENCOUNTER — Other Ambulatory Visit: Payer: Self-pay | Admitting: Internal Medicine

## 2023-03-28 DIAGNOSIS — F419 Anxiety disorder, unspecified: Secondary | ICD-10-CM

## 2023-04-19 ENCOUNTER — Ambulatory Visit: Admitting: Internal Medicine

## 2023-04-19 ENCOUNTER — Encounter: Payer: Self-pay | Admitting: Internal Medicine

## 2023-04-19 VITALS — BP 119/76 | HR 88 | Ht 64.5 in | Wt 154.2 lb

## 2023-04-19 DIAGNOSIS — F419 Anxiety disorder, unspecified: Secondary | ICD-10-CM

## 2023-04-19 MED ORDER — LORAZEPAM 0.5 MG PO TABS
0.5000 mg | ORAL_TABLET | Freq: Two times a day (BID) | ORAL | 0 refills | Status: DC | PRN
Start: 2023-04-19 — End: 2023-05-01

## 2023-04-19 NOTE — Patient Instructions (Signed)
 It was a pleasure to see you today.  Thank you for giving us  the opportunity to be involved in your care.  Below is a brief recap of your visit and next steps.  We will plan to see you again in July.  Summary Add lorazepam 0.5 mg twice daily as needed for anxiety relief x 20 tablets Return to care if anxiety worsens or fails to improve at which time we should consider starting a daily medication. Follow up for routine care in July.

## 2023-04-19 NOTE — Progress Notes (Signed)
 Acute Office Visit  Subjective:     Patient ID: [name], female    DOB: [birth year], 63 y.o.   MRN: [id number]  Chief Complaint  Patient presents with   Anxiety    Patient feels her anxiety medication needs to be changed    [name] presents today for an acute visit in the setting of anxiety.  She was last evaluated by me on 2/11 at which time she endorsed increasing stress and anxiety levels.  Ultimately, hydroxyzine was prescribed for as needed anxiety relief.  She reports that this had been effective until recently when her dog became sick and had to be taken to her International aid/development worker.  [name] believes this has worsened her anxiety and she has had difficulty calming down since this acute event.  She additionally endorses intermittent diffuse paresthesias, headaches, and a loss of appetite.  She is interested in additional medication options for anxiety relief.  Review of Systems  Psychiatric/Behavioral:  The patient is nervous/anxious.       Objective:    BP 119/76   Pulse 88   Ht 5' 4.5" (1.638 m)   Wt 154 lb 3.2 oz (69.9 kg)   LMP 01/11/2013   SpO2 94%   BMI 26.06 kg/m   Physical Exam Constitutional:      General: She is not in acute distress.    Appearance: Normal appearance. She is not toxic-appearing.  HENT:     Head: Normocephalic and atraumatic.     Right Ear: External ear normal.     Left Ear: External ear normal.     Nose: Nose normal. No congestion or rhinorrhea.     Mouth/Throat:     Mouth: Mucous membranes are moist.     Pharynx: Oropharynx is clear. No oropharyngeal exudate or posterior oropharyngeal erythema.  Eyes:     General: No scleral icterus.    Extraocular Movements: Extraocular movements intact.     Conjunctiva/sclera: Conjunctivae normal.     Pupils: Pupils are equal, round, and reactive to light.  Cardiovascular:     Rate and Rhythm: Normal rate and regular rhythm.     Pulses: Normal pulses.     Heart sounds: Normal heart sounds. No  murmur heard.    No friction rub. No gallop.  Pulmonary:     Effort: Pulmonary effort is normal.     Breath sounds: Normal breath sounds. No wheezing, rhonchi or rales.  Abdominal:     General: Abdomen is flat. Bowel sounds are normal. There is no distension.     Palpations: Abdomen is soft.     Tenderness: There is no abdominal tenderness.  Musculoskeletal:        General: No swelling. Normal range of motion.     Cervical back: Normal range of motion.     Right lower leg: No edema.     Left lower leg: No edema.  Lymphadenopathy:     Cervical: No cervical adenopathy.  Skin:    General: Skin is warm and dry.     Capillary Refill: Capillary refill takes less than 2 seconds.     Coloration: Skin is not jaundiced.  Neurological:     General: No focal deficit present.     Mental Status: She is alert and oriented to person, place, and time.  Psychiatric:        Mood and Affect: Mood normal.        Behavior: Behavior normal.       Assessment & Plan:  Problem List Items Addressed This Visit       Anxiety - Primary   Presenting today for an acute visit in the setting of anxiety with acute stress reaction.  Hydroxyzine 10 mg as needed for anxiety relief had previously been effective but has not worked recently after her dog became ill and had to be taken to the International aid/development worker.  We discussed additional treatment options for anxiety relief.  Ultimately, I have prescribed lorazepam 0.5 mg twice daily as needed for anxiety relief x 20 tablets.  We discussed that this medication is intended for short-term use in the setting of acute stress reaction and we will need to consider daily options if anxiety persists.  She is in agreement with this plan and will return to care if symptoms worsen or fail to improve.        Meds ordered this encounter  Medications   LORazepam (ATIVAN) 0.5 MG tablet    Sig: Take 1 tablet (0.5 mg total) by mouth 2 (two) times daily as needed for anxiety.     Dispense:  20 tablet    Refill:  0    Return if symptoms worsen or fail to improve.  Tobi Fortes, MD

## 2023-04-19 NOTE — Assessment & Plan Note (Addendum)
 Presenting today for an acute visit in the setting of anxiety with acute stress reaction.  Hydroxyzine 10 mg as needed for anxiety relief had previously been effective but has not worked recently after her dog became ill and had to be taken to the International aid/development worker.  We discussed additional treatment options for anxiety relief.  Ultimately, I have prescribed lorazepam 0.5 mg twice daily as needed for anxiety relief x 20 tablets.  We discussed that this medication is intended for short-term use in the setting of acute stress reaction and we will need to consider daily options if anxiety persists.  She is in agreement with this plan and will return to care if symptoms worsen or fail to improve.

## 2023-04-23 ENCOUNTER — Ambulatory Visit

## 2023-04-23 VITALS — BP 124/84 | HR 90 | Ht 64.0 in | Wt 153.0 lb

## 2023-04-23 DIAGNOSIS — M47812 Spondylosis without myelopathy or radiculopathy, cervical region: Secondary | ICD-10-CM | POA: Diagnosis not present

## 2023-04-23 MED ORDER — METHOCARBAMOL 750 MG PO TABS: 750.0000 mg | ORAL_TABLET | Freq: Three times a day (TID) | ORAL | 2 refills | Status: DC | PRN

## 2023-04-23 NOTE — Progress Notes (Signed)
   Established Patient Office Visit  Subjective   Patient ID: [name], female    DOB: 27-Dec-1960  Age: 63 y.o. MRN: [id number]  Chief Complaint  Patient presents with   Medical Management of Chronic Issues    Pt here for medication    HPI    Review of Systems  Constitutional: Negative.   HENT: Negative.    Eyes: Negative.   Respiratory: Negative.    Cardiovascular: Negative.   Genitourinary: Negative.   Musculoskeletal:  Positive for neck pain.  Skin: Negative.   Neurological:  Positive for tingling (left side) and headaches.  Psychiatric/Behavioral:  Negative for depression. The patient is nervous/anxious.       Objective:     BP 124/84   Pulse 90   Ht 5\' 4"  (1.626 m)   Wt 153 lb 0.6 oz (69.4 kg)   LMP 01/11/2013   SpO2 96%   BMI 26.27 kg/m    Physical Exam Vitals and nursing note reviewed.  Constitutional:      Appearance: Normal appearance.  Eyes:     Extraocular Movements: Extraocular movements intact.     Pupils: Pupils are equal, round, and reactive to light.  Cardiovascular:     Rate and Rhythm: Normal rate and regular rhythm.  Pulmonary:     Effort: Pulmonary effort is normal.     Breath sounds: Normal breath sounds.  Musculoskeletal:     Cervical back: No rigidity. Pain with movement and muscular tenderness present. No spinous process tenderness. Normal range of motion.  Neurological:     Mental Status: She is alert and oriented to person, place, and time.  Psychiatric:        Mood and Affect: Mood normal.        Thought Content: Thought content normal.      No results found for any visits on 04/23/23.    The 10-year ASCVD risk score (Arnett DK, et al., 2019) is: 3%    Assessment & Plan:   Problem List Items Addressed This Visit       Musculoskeletal and Integument   Spondylosis of cervical region without myelopathy or radiculopathy - Primary   Ongoing for 3 months without improvement.   Recent x-ray of cervical spine  showed:  1. Multilevel degenerative changes of the cervical spine most pronounced at C6-7 where there is moderate intervertebral disc space height loss and trace retrolisthesis. 2. Severe RIGHT osseous neuroforaminal narrowing at C3-4 and C4-5.   Refer to ortho for further evaluation.       Relevant Medications   methocarbamol  (ROBAXIN -750) 750 MG tablet   Other Relevant Orders   Ambulatory referral to Orthopedic Surgery    No follow-ups on file.    Alison Irvine, FNP

## 2023-04-24 DIAGNOSIS — M47812 Spondylosis without myelopathy or radiculopathy, cervical region: Secondary | ICD-10-CM | POA: Insufficient documentation

## 2023-04-24 DIAGNOSIS — M542 Cervicalgia: Secondary | ICD-10-CM | POA: Diagnosis not present

## 2023-04-24 NOTE — Assessment & Plan Note (Signed)
 Ongoing for 3 months without improvement.   Recent x-ray of cervical spine showed:  1. Multilevel degenerative changes of the cervical spine most pronounced at C6-7 where there is moderate intervertebral disc space height loss and trace retrolisthesis. 2. Severe RIGHT osseous neuroforaminal narrowing at C3-4 and C4-5.   Refer to ortho for further evaluation.

## 2023-04-30 ENCOUNTER — Other Ambulatory Visit: Payer: Self-pay | Admitting: Internal Medicine

## 2023-04-30 DIAGNOSIS — F419 Anxiety disorder, unspecified: Secondary | ICD-10-CM

## 2023-04-30 NOTE — Telephone Encounter (Signed)
 Copied from CRM [id number]. Topic: Clinical - Medication Refill >> Apr 30, 2023  8:56 AM Crispin Dolphin wrote: Most Recent Primary Care Visit:  Provider: Alison Irvine  Department: [facility]  Visit Type: OFFICE VISIT  Date: 04/23/2023  Medication: LORazepam  (ATIVAN ) 0.5 MG tablet - xray results / mri tomorrow   Has the patient contacted their pharmacy? No - provider told her to call  (Agent: If no, request that the patient contact the pharmacy for the refill. If patient does not wish to contact the pharmacy document the reason why and proceed with request.) (Agent: If yes, when and what did the pharmacy advise?)  Is this the correct pharmacy for this prescription? Yes If no, delete pharmacy and type the correct one.  This is the patient's preferred pharmacy:  [facility] - [place], [place] - [address] [place] [address] Phone: [phone] Fax: [phone]   Has the prescription been filled recently? Yes  Is the patient out of the medication? No - 2 left   Has the patient been seen for an appointment in the last year OR does the patient have an upcoming appointment? Yes  Can we respond through MyChart? Yes  Agent: Please be advised that Rx refills may take up to 3 business days. We ask that you follow-up with your pharmacy.

## 2023-05-01 ENCOUNTER — Telehealth: Payer: Self-pay | Admitting: Internal Medicine

## 2023-05-01 DIAGNOSIS — F419 Anxiety disorder, unspecified: Secondary | ICD-10-CM

## 2023-05-01 DIAGNOSIS — M542 Cervicalgia: Secondary | ICD-10-CM | POA: Diagnosis not present

## 2023-05-01 MED ORDER — LORAZEPAM 0.5 MG PO TABS
0.5000 mg | ORAL_TABLET | Freq: Two times a day (BID) | ORAL | 0 refills | Status: DC | PRN
Start: 2023-05-01 — End: 2023-05-09

## 2023-05-01 NOTE — Telephone Encounter (Signed)
 Patient needs today only has 1 left  [facility] has not yet received.  Prescription Request  05/01/2023  LOV: 04/19/2023  What is the name of the medication or equipment? LORazepam  (ATIVAN ) 0.5 MG tablet [[id number]]   Have you contacted your pharmacy to request a refill? Yes   Which pharmacy would you like this sent to?  [facility]  Patient notified that their request is being sent to the clinical staff for review and that they should receive a response within 2 business days.   Please advise at  walked into office

## 2023-05-02 ENCOUNTER — Ambulatory Visit: Payer: Self-pay

## 2023-05-02 ENCOUNTER — Ambulatory Visit: Admitting: Orthopedic Surgery

## 2023-05-02 DIAGNOSIS — M542 Cervicalgia: Secondary | ICD-10-CM | POA: Diagnosis not present

## 2023-05-02 NOTE — Telephone Encounter (Signed)
 Copied from CRM [id number]. Topic: Clinical - Red Word Triage >> May 02, 2023  3:44 PM Baldemar Lev wrote: Red Word that prompted transfer to Nurse Triage: Anxiety   Chief Complaint: Anxiety  Symptoms: No symptoms at this time  Frequency: Intermittent  Disposition: [] ED /[] Urgent Care (no appt availability in office) / [x] Appointment(In office/virtual)/ []  [place] [facility]/ [] Home Care/ [] Refused Recommended Disposition /[] [place] [facility]/ []  Follow-up with PCP Additional Notes: Patient states that she had an MRI done of her neck today and was advised to see her PCP for her anxiety. She states that she has a low dose of Ativan  that she takes but that it only helps for a little while. She has no symptoms at this time but would like to make an appointment to speak with Dr. Kermit Ped about this. Appointment made for the patient on Monday. Patient instructed to call back for new or worsening symptoms. Patient verbalized understanding and agreement with this plan.     Reason for Disposition  [1] Symptoms of anxiety or panic attack AND [2] is a chronic symptom (recurrent or ongoing AND present > 4 weeks)  Answer Assessment - Initial Assessment Questions 1. CONCERN: "Did anything happen that prompted you to call today?"      Increased anxiety  2. ANXIETY SYMPTOMS: "Can you describe how you (your loved one; patient) have been feeling?" (e.g., tense, restless, panicky, anxious, keyed up, overwhelmed, sense of impending doom).      No symptoms now 3. ONSET: "How long have you been feeling this way?" (e.g., hours, days, weeks)     Been ongoing since January  4. SEVERITY: "How would you rate the level of anxiety?" (e.g., 0 - 10; or mild, moderate, severe).     0/10 5. FUNCTIONAL IMPAIRMENT: "How have these feelings affected your ability to do daily activities?" "Have you had more difficulty than usual doing your normal daily activities?" (e.g., getting better, same, worse; self-care, school, work,  interactions)     None right now 6. HISTORY: "Have you felt this way before?" "Have you ever been diagnosed with an anxiety problem in the past?" (e.g., generalized anxiety disorder, panic attacks, PTSD). If Yes, ask: "How was this problem treated?" (e.g., medicines, counseling, etc.)     Yes 7. RISK OF HARM - SUICIDAL IDEATION: "Do you ever have thoughts of hurting or killing yourself?" If Yes, ask:  "Do you have these feelings now?" "Do you have a plan on how you would do this?"     No 8. TREATMENT:  "What has been done so far to treat this anxiety?" (e.g., medicines, relaxation strategies). "What has helped?"     Ativan  with minor relief  12. OTHER SYMPTOMS: "Do you have any other symptoms?" (e.g., feeling depressed, trouble concentrating, trouble sleeping, trouble breathing, palpitations or fast heartbeat, chest pain, sweating, nausea, or diarrhea)       None at this time  Protocols used: Anxiety and Panic Attack-A-AH

## 2023-05-02 NOTE — Telephone Encounter (Signed)
 Appointment made

## 2023-05-03 ENCOUNTER — Ambulatory Visit: Admitting: Internal Medicine

## 2023-05-03 ENCOUNTER — Encounter: Payer: Self-pay | Admitting: Internal Medicine

## 2023-05-03 ENCOUNTER — Ambulatory Visit: Payer: Self-pay | Admitting: *Deleted

## 2023-05-03 VITALS — BP 113/78 | HR 84 | Ht 64.5 in | Wt 155.4 lb

## 2023-05-03 DIAGNOSIS — F419 Anxiety disorder, unspecified: Secondary | ICD-10-CM

## 2023-05-03 MED ORDER — FLUOXETINE HCL 20 MG PO CAPS: 20.0000 mg | ORAL_CAPSULE | Freq: Every day | ORAL | 3 refills | Status: DC

## 2023-05-03 NOTE — Telephone Encounter (Signed)
 Chief Complaint: neck pain , anxiety worsening  Symptoms: neck pain from arthritis . Reports anxiety causes worsening neck pain. Nausea, no appetite, nervous energy inside.  Frequency: on going  Pertinent Negatives: Patient denies hurting self  Disposition: [] ED /[] Urgent Care (no appt availability in office) / [x] Appointment(In office/virtual)/ []  [place] [facility]/ [] Home Care/ [] Refused Recommended Disposition /[] [place] [facility]/ []  Follow-up with PCP Additional Notes:   Appt scheduled today to f/u with PCP.       Copied from CRM [id number]. Topic: Clinical - Red Word Triage >> May 03, 2023  8:38 AM Stanly Early wrote: Red Word that prompted transfer to Nurse Triage: Neck pain, Her pcp said her pain is due to her anxiety.Her pcp prescribe her LORazepam  (ATIVAN ) 0.5 MG tablet. Reason for Disposition  Preventing neck pain, questions about  Patient sounds very upset or troubled to the triager  Answer Assessment - Initial Assessment Questions 1. ONSET: "When did the pain begin?"      End of Jan. 2. LOCATION: "Where does it hurt?"      Top of head down body  3. PATTERN "Does the pain come and go, or has it been constant since it started?"      Constant  4. SEVERITY: "How bad is the pain?"  (Scale 1-10; or mild, moderate, severe)   - NO PAIN (0): no pain or only slight stiffness    - MILD (1-3): doesn't interfere with normal activities    - MODERATE (4-7): interferes with normal activities or awakens from sleep    - SEVERE (8-10):  excruciating pain, unable to do any normal activities      No pain 5. RADIATION: "Does the pain go anywhere else, shoot into your arms?"     na 6. CORD SYMPTOMS: "Any weakness or numbness of the arms or legs?"     na 7. CAUSE: "What do you think is causing the neck pain?"     anxiety 8. NECK OVERUSE: "Any recent activities that involved turning or twisting the neck?"     na 9. OTHER SYMPTOMS: "Do you have any other symptoms?" (e.g., headache,  fever, chest pain, difficulty breathing, neck swelling)     Neck pain better but continues with feelings of nausea, no appetite feeling of sickness feeling through body nervous energy inside  10. PREGNANCY: "Is there any chance you are pregnant?" "When was your last menstrual period?"       na  Answer Assessment - Initial Assessment Questions 1. CONCERN: "Did anything happen that prompted you to call today?"      Worsening anxiety causing neck pain to worsen 2. ANXIETY SYMPTOMS: "Can you describe how you (your loved one; patient) have been feeling?" (e.g., tense, restless, panicky, anxious, keyed up, overwhelmed, sense of impending doom).      Nervous energy inside, anxious when wakes up  3. ONSET: "How long have you been feeling this way?" (e.g., hours, days, weeks)     Na  4. SEVERITY: "How would you rate the level of anxiety?" (e.g., 0 - 10; or mild, moderate, severe).     Worsening  5. FUNCTIONAL IMPAIRMENT: "How have these feelings affected your ability to do daily activities?" "Have you had more difficulty than usual doing your normal daily activities?" (e.g., getting better, same, worse; self-care, school, work, interactions)     Na  6. HISTORY: "Have you felt this way before?" "Have you ever been diagnosed with an anxiety problem in the past?" (e.g., generalized anxiety disorder,  panic attacks, PTSD). If Yes, ask: "How was this problem treated?" (e.g., medicines, counseling, etc.)     Na  7. RISK OF HARM - SUICIDAL IDEATION: "Do you ever have thoughts of hurting or killing yourself?" If Yes, ask:  "Do you have these feelings now?" "Do you have a plan on how you would do this?"     No  8. TREATMENT:  "What has been done so far to treat this anxiety?" (e.g., medicines, relaxation strategies). "What has helped?"     Taking ativan  9. TREATMENT - THERAPIST: "Do you have a counselor or therapist? Name?"     Na  10. POTENTIAL TRIGGERS: "Do you drink caffeinated beverages (e.g., coffee,  colas, teas), and how much daily?" "Do you drink alcohol or use any drugs?" "Have you started any new medicines recently?"       na 11. PATIENT SUPPORT: "Who is with you now?" "Who do you live with?" "Do you have family or friends who you can talk to?"        na 12. OTHER SYMPTOMS: "Do you have any other symptoms?" (e.g., feeling depressed, trouble concentrating, trouble sleeping, trouble breathing, palpitations or fast heartbeat, chest pain, sweating, nausea, or diarrhea)       Nausea, no appetite nervous inside 13. PREGNANCY: "Is there any chance you are pregnant?" "When was your last menstrual period?"       na  Protocols used: Neck Pain or Stiffness-A-AH, Anxiety and Panic Attack-A-AH

## 2023-05-03 NOTE — Assessment & Plan Note (Signed)
 Returning to care today for acute visit in the setting of anxiety.  Last evaluated by me 4/17 as described above.  Lorazepam  was added for as needed anxiety relief in the setting of acute stress reaction.  Lorazepam  provides temporary symptom relief but does not last.  MRI of the cervical spine completed yesterday did not show any acute findings to explain her symptoms.  She returns to care today to discuss additional treatment options. -Treatment options reviewed.  Through shared decision making, fluoxetine  20 mg daily has been prescribed for management of generalized anxiety disorder.  Can continue as needed use of lorazepam  for anxiety relief.  I have also placed a referral to counseling.  She will return to care if symptoms worsen or fail to improve.  Otherwise, she is scheduled for routine follow-up in July.

## 2023-05-03 NOTE — Patient Instructions (Signed)
 It was a pleasure to see you today.  Thank you for giving us  the opportunity to be involved in your care.  Below is a brief recap of your visit and next steps.  We will plan to see you again in July.  Summary Start fluoxetine  20 mg daily Counseling referral placed Follow up if symptoms do not improve. Otherwise you are scheduled for follow up in July

## 2023-05-03 NOTE — Progress Notes (Signed)
 Acute Office Visit  Subjective:     Patient ID: [name], female    DOB: May 01, 1960, 63 y.o.   MRN: [id number]  Chief Complaint  Patient presents with   Anxiety   [name] returns to care today for an acute visit in the setting of anxiety.  She was last evaluated by me on 4/17 at which time she reported increased anxiety after her dog became ill and had to be taken to the veterinarian.  Lorazepam  0.5 mg twice daily as needed for anxiety relief was prescribed in the setting of acute stress reaction.  She returned to care 4/21 endorsing neck pain x 3 months and was referred to orthopedic surgery.  Seen by orthopedic surgery at [facility] yesterday (4/30).  She completed an MRI that did not show any acute findings to explain her recent symptoms, which she has described as "waves" of anxiety, neck discomfort, and nausea.  Her orthopedic surgeon, Dr. Vaughn Georges, suspects that her symptoms are largely attributable to anxiety and recommended PCP follow-up for further management.  Today [name] states that she cannot get rid of the feeling of anxiety.  It begins shortly after waking up.  Symptoms are temporarily alleviated with lorazepam , but relief does not last.  She would like to review additional medication options.  Review of Systems  Psychiatric/Behavioral:  The patient is nervous/anxious.       Objective:    BP 113/78   Pulse 84   Ht 5' 4.5" (1.638 m)   Wt 155 lb 6.4 oz (70.5 kg)   LMP 01/11/2013   SpO2 95%   BMI 26.26 kg/m   Physical Exam Constitutional:      General: She is not in acute distress.    Appearance: Normal appearance. She is not toxic-appearing.  HENT:     Head: Normocephalic and atraumatic.     Right Ear: External ear normal.     Left Ear: External ear normal.     Nose: Nose normal. No congestion or rhinorrhea.     Mouth/Throat:     Mouth: Mucous membranes are moist.     Pharynx: Oropharynx is clear. No oropharyngeal exudate or posterior oropharyngeal  erythema.  Eyes:     General: No scleral icterus.    Extraocular Movements: Extraocular movements intact.     Conjunctiva/sclera: Conjunctivae normal.     Pupils: Pupils are equal, round, and reactive to light.  Cardiovascular:     Rate and Rhythm: Normal rate and regular rhythm.     Pulses: Normal pulses.     Heart sounds: Normal heart sounds. No murmur heard.    No friction rub. No gallop.  Pulmonary:     Effort: Pulmonary effort is normal.     Breath sounds: Normal breath sounds. No wheezing, rhonchi or rales.  Abdominal:     General: Abdomen is flat. Bowel sounds are normal. There is no distension.     Palpations: Abdomen is soft.     Tenderness: There is no abdominal tenderness.  Musculoskeletal:        General: No swelling. Normal range of motion.     Cervical back: Normal range of motion.     Right lower leg: No edema.     Left lower leg: No edema.  Lymphadenopathy:     Cervical: No cervical adenopathy.  Skin:    General: Skin is warm and dry.     Capillary Refill: Capillary refill takes less than 2 seconds.     Coloration: Skin  is not jaundiced.  Neurological:     General: No focal deficit present.     Mental Status: She is alert and oriented to person, place, and time.  Psychiatric:        Mood and Affect: Mood normal.        Behavior: Behavior normal.       Assessment & Plan:   Problem List Items Addressed This Visit       Anxiety - Primary   Returning to care today for acute visit in the setting of anxiety.  Last evaluated by me 4/17 as described above.  Lorazepam  was added for as needed anxiety relief in the setting of acute stress reaction.  Lorazepam  provides temporary symptom relief but does not last.  MRI of the cervical spine completed yesterday did not show any acute findings to explain her symptoms.  She returns to care today to discuss additional treatment options. -Treatment options reviewed.  Through shared decision making, fluoxetine  20 mg daily has  been prescribed for management of generalized anxiety disorder.  Can continue as needed use of lorazepam  for anxiety relief.  I have also placed a referral to counseling.  She will return to care if symptoms worsen or fail to improve.  Otherwise, she is scheduled for routine follow-up in July.       Meds ordered this encounter  Medications   FLUoxetine  (PROZAC ) 20 MG capsule    Sig: Take 1 capsule (20 mg total) by mouth daily.    Dispense:  90 capsule    Refill:  3    Return if symptoms worsen or fail to improve.  Tobi Fortes, MD

## 2023-05-04 ENCOUNTER — Ambulatory Visit: Payer: Self-pay

## 2023-05-04 NOTE — Telephone Encounter (Signed)
 Patient took lorazepam  not xanax , patient was advised to reach back out to us  next week

## 2023-05-04 NOTE — Telephone Encounter (Signed)
 Chief Complaint: anxiety Symptoms: anxiety Frequency: since Jan Pertinent Negatives: Patient denies  Disposition: [] ED /[] Urgent Care (no appt availability in office) / [] Appointment(In office/virtual)/ []  [place] [facility]/ [] Home Care/ [] Refused Recommended Disposition /[] [place] [facility]/ [x]  Follow-up with PCP  Additional Notes: pt states that she saw PCP yesterday and was place on new medication. States she took one yesterday and also took her xanax. States she still feels the same symptoms and the medication is not working. States that she gets maybe and hour of relief from her symptoms. States that the she has been having this since Jan and nothing is working. Please call cell. No appt available with Dr. Dixion until June   Copied from CRM [id number]. Topic: Clinical - Red Word Triage >> May 04, 2023 10:41 AM Juluis Ok wrote: [facility] that prompted transfer to Nurse Triage: Anxiety, nausea Reason for Disposition  MODERATE anxiety (e.g., persistent or frequent anxiety symptoms; interferes with sleep, school, or work)  Protocols used: Anxiety and Panic Attack-A-AH

## 2023-05-07 ENCOUNTER — Ambulatory Visit: Payer: Self-pay

## 2023-05-07 ENCOUNTER — Ambulatory Visit: Payer: Self-pay | Admitting: Internal Medicine

## 2023-05-07 NOTE — Telephone Encounter (Signed)
 Chief Complaint: body pains Symptoms: body pains Frequency: 3 months Pertinent Negatives: Patient denies fever Disposition: [] ED /[] Urgent Care (no appt availability in office) / [x] Appointment(In office/virtual)/ []  [place] [facility]/ [x] Home Care/ [] Refused Recommended Disposition /[] [place] [facility]/ [x]  Follow-up with PCP Additional Notes: pt states that she is still experiencing the burning sensation throughout her body and the medication she was prescribed is not helping. States nausea, no energy or appetite. Pt state that she is still having to take her xanax and that now that is not working as well either. States that she cannot wait til Thursday for the medication to work. States that she woke this morning with more nausea.  Pt would like a call back about her medication.   Copied from CRM [id number]. Topic: Clinical - Red Word Triage >> May 07, 2023  9:59 AM Donnise Galen B wrote: Red Word that prompted transfer to Nurse Triage: Patient is calling in experiencing nausea, no energy, and extreme pain. This pain starts at the top of her head and travels down all through her body. The doctor has proscribed her medication for these symptoms but the patient feels it is worsening over time and the medication does not really help. Reason for Disposition  [1] MODERATE pain (e.g., interferes with normal activities) AND [2] present > 3 days  Answer Assessment - Initial Assessment Questions 1. ONSET: "When did the muscle aches or body pains start?"      months 2. LOCATION: "What part of your body is hurting?" (e.g., entire body, arms, legs)      Entire body 3. SEVERITY: "How bad is the pain?" (Scale 1-10; or mild, moderate, severe)   - MILD (1-3): doesn't interfere with normal activities    - MODERATE (4-7): interferes with normal activities or awakens from sleep    - SEVERE (8-10):  excruciating pain, unable to do any normal activities      Mild-mod 4. CAUSE: "What do you think is causing  the pains?"     anxiety 5. FEVER: "Have you been having fever?"     no 6. OTHER SYMPTOMS: "Do you have any other symptoms?" (e.g., chest pain, weakness, rash, cold or flu symptoms, weight loss)     Nausea, headache  Protocols used: Muscle Aches and Body Pain-A-AH

## 2023-05-07 NOTE — Telephone Encounter (Signed)
 Patient advised.

## 2023-05-08 ENCOUNTER — Emergency Department (HOSPITAL_COMMUNITY)
Admission: EM | Admit: 2023-05-08 | Discharge: 2023-05-08 | Disposition: A | Discharge status: Home / Self Care | Attending: Emergency Medicine | Admitting: Emergency Medicine

## 2023-05-08 ENCOUNTER — Other Ambulatory Visit: Payer: Self-pay | Admitting: Internal Medicine

## 2023-05-08 ENCOUNTER — Encounter (HOSPITAL_COMMUNITY): Payer: Self-pay | Admitting: *Deleted

## 2023-05-08 ENCOUNTER — Ambulatory Visit: Payer: Self-pay

## 2023-05-08 ENCOUNTER — Other Ambulatory Visit: Payer: Self-pay

## 2023-05-08 DIAGNOSIS — R0789 Other chest pain: Secondary | ICD-10-CM | POA: Diagnosis not present

## 2023-05-08 DIAGNOSIS — E039 Hypothyroidism, unspecified: Secondary | ICD-10-CM | POA: Diagnosis not present

## 2023-05-08 DIAGNOSIS — R202 Paresthesia of skin: Secondary | ICD-10-CM | POA: Diagnosis not present

## 2023-05-08 DIAGNOSIS — R200 Anesthesia of skin: Secondary | ICD-10-CM | POA: Diagnosis not present

## 2023-05-08 DIAGNOSIS — R630 Anorexia: Secondary | ICD-10-CM | POA: Insufficient documentation

## 2023-05-08 DIAGNOSIS — F419 Anxiety disorder, unspecified: Secondary | ICD-10-CM | POA: Insufficient documentation

## 2023-05-08 LAB — BASIC METABOLIC PANEL WITH GFR
Anion gap: 8 (ref 5–15)
BUN: 15 mg/dL (ref 6–20)
CO2: 28 mmol/L (ref 22–32)
Calcium: 9.3 mg/dL (ref 8.9–10.3)
Chloride: 102 mmol/L (ref 98–111)
Creatinine, Ser: 0.63 mg/dL (ref 0.44–1.00)
GFR, Estimated: >60 mL/min (ref >60)
Glucose, Bld: 99 mg/dL (ref 70–99)
Potassium: 4 mmol/L (ref 3.5–5.1)
Sodium: 138 mmol/L (ref 135–145)

## 2023-05-08 LAB — CBC
HCT: 47.1 % — ABNORMAL HIGH (ref 36.0–46.0)
Hemoglobin: 15.2 g/dL — ABNORMAL HIGH (ref 12.0–15.0)
MCH: 29.2 pg (ref 26.0–34.0)
MCHC: 32.3 g/dL (ref 30.0–36.0)
MCV: 90.4 fL (ref 80.0–100.0)
Platelets: 305 10*3/uL (ref 150–400)
RBC: 5.21 MIL/uL — ABNORMAL HIGH (ref 3.87–5.11)
RDW: 12.4 % (ref 11.5–15.5)
WBC: 9 10*3/uL (ref 4.0–10.5)
nRBC: 0 % (ref 0.0–0.2)

## 2023-05-08 LAB — TROPONIN I (HIGH SENSITIVITY): Troponin I (High Sensitivity): <2 ng/L (ref <18)

## 2023-05-08 LAB — TSH: TSH: 3.441 u[IU]/mL (ref 0.350–4.500)

## 2023-05-08 MED ORDER — ONDANSETRON 4 MG PO TBDP
4.0000 mg | ORAL_TABLET | Freq: Once | ORAL | Status: AC
  Administered 2023-05-08: 4 mg via ORAL
  Filled 2023-05-08: qty 1

## 2023-05-08 MED ORDER — LORAZEPAM 1 MG PO TABS
1.0000 mg | ORAL_TABLET | Freq: Once | ORAL | Status: AC
  Administered 2023-05-08: 1 mg via ORAL
  Filled 2023-05-08: qty 1

## 2023-05-08 NOTE — ED Triage Notes (Signed)
 Pt states she has been switching her anxiety meds per her PCP and she states she has been having some right face pressure and some feeling of tightness in her chest

## 2023-05-08 NOTE — Telephone Encounter (Unsigned)
 Copied from CRM [id number]. Topic: Clinical - Medication Refill >> May 08, 2023 10:31 AM Stanly Early wrote: Most Recent Primary Care Visit:  Provider: DIXON, PHILLIP E  Department: [facility]  Visit Type: ACUTE  Date: 05/03/2023  Medication: LORazepam  (ATIVAN ) 0.5 MG tablet, PT would like to go up in the dose  Has the patient contacted their pharmacy? No (Agent: If no, request that the patient contact the pharmacy for the refill. If patient does not wish to contact the pharmacy document the reason why and proceed with request.) (Agent: If yes, when and what did the pharmacy advise?)  Is this the correct pharmacy for this prescription? Yes If no, delete pharmacy and type the correct one.  This is the patient's preferred pharmacy:  [facility] - [place], [place] - [address] [place] [address] Phone: [phone] Fax: [phone]  [facility] [phone] - [place], [place] - [address] AT [facility] [address] [place] [address] Phone: [phone] Fax: [phone]   Has the prescription been filled recently? Yes  Is the patient out of the medication? No 5 left  Has the patient been seen for an appointment in the last year OR does the patient have an upcoming appointment? Yes  Can we respond through MyChart? No Rather have a phone call [phone]  Agent: Please be advised that Rx refills may take up to 3 business days. We ask that you follow-up with your pharmacy.

## 2023-05-08 NOTE — ED Provider Notes (Signed)
 [place] EMERGENCY DEPARTMENT AT [facility] Provider Note   CSN: [id number] Arrival date & time: 05/08/23  0645     History  Chief Complaint  Patient presents with   Anxiety    [name] is a 63 y.o. female.  The history is provided by the patient and a significant other.  Patient with history of arthritis, hypothyroidism, anxiety presents for anxiety type symptoms for 3 months.  Patient reports her doctors are working on getting her meds right.  She reports for over 3 months she has had the sensation of numbness and tingling going from her head all the way down to her legs She also reports chest burning for several days.  She reports mild headache but no visual changes.  No focal weakness.  No slurred speech.  No fever, no vomiting, no diarrhea.  She reports decreased appetite.  Significant other reports there has not been any slurred speech or confusion.  She has been seen by her PCP, recently started on Prozac  and also lorazepam .  Patient reports he has decreased sleep but lorazepam  does help her symptoms She has had no suicidal thoughts She is also concerned it could be her thyroid     Past Medical History:  Diagnosis Date   Arthritis    Hypothyroidism     Home Medications Prior to Admission medications   Medication Sig Start Date End Date Taking? Authorizing Provider  FLUoxetine  (PROZAC ) 20 MG capsule Take 1 capsule (20 mg total) by mouth daily. 05/03/23   Tobi Fortes, MD  levothyroxine  (SYNTHROID ) 88 MCG tablet Take 1 tablet (88 mcg total) by mouth daily before breakfast. 10/26/22   Wendel Hals, NP  LORazepam  (ATIVAN ) 0.5 MG tablet Take 1 tablet (0.5 mg total) by mouth 2 (two) times daily as needed for anxiety. 05/01/23   Tobi Fortes, MD  methocarbamol  (ROBAXIN -750) 750 MG tablet Take 1 tablet (750 mg total) by mouth every 8 (eight) hours as needed for muscle spasms. 04/23/23   Alison Irvine, FNP  Multiple Vitamin (MULTIVITAMIN) tablet Take 1  tablet by mouth daily.    [provider]  rosuvastatin  (CRESTOR ) 20 MG tablet TAKE ONE TABLET BY MOUTH ONCE DAILY. 11/22/22   Tobi Fortes, MD      Allergies    Patient has no known allergies.    Review of Systems   Review of Systems  Eyes:        Denies diplopia or visual loss  Gastrointestinal:  Positive for nausea. Negative for diarrhea and vomiting.  Neurological:  Positive for headaches. Negative for speech difficulty.  Psychiatric/Behavioral:  Positive for sleep disturbance. Negative for suicidal ideas. The patient is nervous/anxious.     Physical Exam Updated Vital Signs BP 96/63   Pulse 65   Temp 97.6 F (36.4 C)   Resp 15   Ht 1.638 m (5' 4.5")   Wt 70.5 kg   LMP 01/11/2013   SpO2 92%   BMI 26.26 kg/m  Physical Exam CONSTITUTIONAL: Well developed/well nourished, anxious HEAD: Normocephalic/atraumatic EYES: EOMI/PERRL, no nystagmus, no ptosis ENMT: Mucous membranes moist NECK: supple no meningeal signs CV: S1/S2 noted, no murmurs/rubs/gallops noted LUNGS: Lungs are clear to auscultation bilaterally, no apparent distress ABDOMEN: soft, NEURO:Awake/alert, face symmetric, no arm or leg drift is noted Equal 5/5 strength with shoulder abduction, elbow flex/extension, wrist flex/extension in upper extremities and equal hand grips bilaterally Equal 5/5 strength with hip flexion,knee flex/extension, foot dorsi/plantar flexion Cranial nerves 3/4/5/6/07/10/08/11/12 tested and intact No  past pointing Sensation to light touch intact in all extremities EXTREMITIES: pulses normal, full ROM SKIN: warm, color normal PSYCH: Anxious   ED Results / Procedures / Treatments   Labs (all labs ordered are listed, but only abnormal results are displayed) Labs Reviewed  CBC - Abnormal; Notable for the following components:      Result Value   RBC 5.21 (*)    Hemoglobin 15.2 (*)    HCT 47.1 (*)    All other components within normal limits  BASIC METABOLIC PANEL  WITH GFR  TSH  TROPONIN I (HIGH SENSITIVITY)    EKG EKG Interpretation Date/Time:  Tuesday May 08 2023 08:03:52 EDT Ventricular Rate:  69 PR Interval:  158 QRS Duration:  80 QT Interval:  382 QTC Calculation: 410 R Axis:   21  Text Interpretation: Sinus rhythm Low voltage, precordial leads Confirmed by Eldon Greenland ([id number]) on 05/08/2023 8:11:22 AM  Radiology No results found.  Procedures Procedures    Medications Ordered in ED Medications  ondansetron  (ZOFRAN -ODT) disintegrating tablet 4 mg (4 mg Oral Given 05/08/23 0759)  LORazepam  (ATIVAN ) tablet 1 mg (1 mg Oral Given 05/08/23 0759)    ED Course/ Medical Decision Making/ A&P Clinical Course as of 05/08/23 9562  Tue May 08, 2023  0750 Patient presents with anxiety symptoms for several months though feels like it is getting worse recently.  Just recently started fluoxetine  Reports recent chest burning for several days.  Will perform EKG, labs and reassess Will need close follow-up with her PCP [DW]  0921 Overall patient appears improved.  Lab workup overall reassuring Patient safe for discharge.  Messages been sent to her PCP I counseled her on appropriate use of Ativan .  Also counseled her on fluoxetine  and how it takes time to work [DW]    Clinical Course User Index [DW] Eldon Greenland, MD                                 Medical Decision Making Amount and/or Complexity of Data Reviewed Labs: ordered. ECG/medicine tests: ordered.  Risk Prescription drug management.   This patient presents to the ED for concern of anxiety and chest burning, this involves an extensive number of treatment options, and is a complaint that carries with it a high risk of complications and morbidity.  The differential diagnosis includes but is not limited to acute coronary syndrome, aortic dissection, pulmonary embolism, pericarditis, anxiety attack, cerebrovascular accident    Comorbidities that complicate the patient  evaluation: Patient's presentation is complicated by their history of hypothyroidism  Social Determinants of Health: Patient's  tobacco use and decreased sleep   increases the complexity of managing their presentation  Additional history obtained: Additional history obtained from significant other Records reviewed Primary Care Documents  Lab Tests: I Ordered, and personally interpreted labs.  The pertinent results include: Labs reassuring   Cardiac Monitoring: The patient was maintained on a cardiac monitor.  I personally viewed and interpreted the cardiac monitor which showed an underlying rhythm of:  sinus rhythm  Medicines ordered and prescription drug management: I ordered medication including Ativan  for anxiety Reevaluation of the patient after these medicines showed that the patient    improved  Reevaluation: After the interventions noted above, I reevaluated the patient and found that they have :improved  Complexity of problems addressed: Patient's presentation is most consistent with  acute presentation with potential threat to life or bodily function  Disposition:  After consideration of the diagnostic results and the patient's response to treatment,  I feel that the patent would benefit from discharge   .           Final Clinical Impression(s) / ED Diagnoses Final diagnoses:  Anxiety    Rx / DC Orders ED Discharge Orders     None         Eldon Greenland, MD 05/08/23 [phone]

## 2023-05-08 NOTE — Telephone Encounter (Signed)
 Have called and left a voicemail to call office to schedule appointment.

## 2023-05-08 NOTE — ED Provider Notes (Signed)
 Mild drop in pulse ox likely due to patient taking Ativan  has been sleeping No chest pain or shortness of breath reported this time   Eldon Greenland, MD 05/08/23 [phone]

## 2023-05-08 NOTE — Telephone Encounter (Signed)
 Chief Complaint: Increasing anxiety Symptoms: burning sensation in arms Frequency: Ongoing, pt in ED this AM Pertinent Negatives: Patient denies continuing sx, notes she was seen in ED today Disposition: [] ED /[] Urgent Care (no appt availability in office) / [] Appointment(In office/virtual)/ []  [place] [facility]/ [] Home Care/ [] Refused Recommended Disposition /[] [place] [facility]/ [x]  Follow-up with PCP Additional Notes: Pt reports she saw PCP last week regarding worsening anxiety. Pt notes she was in ED this AM due to worsening anxiety symptoms. She continues with the increase in Fluoxetine  but acknowledges it will take time for this medication to be fully effective. Pt notes she was given a larger dose if Ativan  in ED today and felt it was much more effective than the 0.5 mg she is currently prescribed and is requesting an increase in dose if MD feels appropriate. OV offered, declined. This RN educated pt on home care, new-worsening symptoms, when to call back/seek emergent care. Pt verbalized understanding and agrees to plan.    Copied from CRM [id number]. Topic: Clinical - Medication Question >> May 08, 2023 10:28 AM Stanly Early wrote: Reason for CRM: FLUoxetine  (PROZAC ) 20 MG capsule PT has a question about how much should she take. Reason for Disposition  [1] Caller has URGENT medicine question about med that PCP or specialist prescribed AND [2] triager unable to answer question  Answer Assessment - Initial Assessment Questions 1. NAME of MEDICINE: "What medicine(s) are you calling about?"     Fluoxetine  20 mg 2. QUESTION: "What is your question?" (e.g., double dose of medicine, side effect)     How much medication should I be taking? 3. PRESCRIBER: "Who prescribed the medicine?" Reason: if prescribed by specialist, call should be referred to that group.     PCP  Protocols used: Medication Question Call-A-AH

## 2023-05-09 ENCOUNTER — Other Ambulatory Visit: Payer: Self-pay

## 2023-05-09 ENCOUNTER — Emergency Department (HOSPITAL_COMMUNITY)
Admission: EM | Admit: 2023-05-09 | Discharge: 2023-05-09 | Disposition: A | Discharge status: Home / Self Care | Attending: Emergency Medicine | Admitting: Emergency Medicine

## 2023-05-09 ENCOUNTER — Encounter (HOSPITAL_COMMUNITY): Payer: Self-pay | Admitting: *Deleted

## 2023-05-09 DIAGNOSIS — R202 Paresthesia of skin: Secondary | ICD-10-CM | POA: Insufficient documentation

## 2023-05-09 DIAGNOSIS — Z79899 Other long term (current) drug therapy: Secondary | ICD-10-CM | POA: Diagnosis not present

## 2023-05-09 DIAGNOSIS — F419 Anxiety disorder, unspecified: Secondary | ICD-10-CM | POA: Insufficient documentation

## 2023-05-09 DIAGNOSIS — E039 Hypothyroidism, unspecified: Secondary | ICD-10-CM | POA: Diagnosis not present

## 2023-05-09 HISTORY — DX: Anxiety disorder, unspecified: F41.9

## 2023-05-09 MED ORDER — LORAZEPAM 0.5 MG PO TABS: 0.5000 mg | ORAL_TABLET | Freq: Two times a day (BID) | ORAL | 0 refills | Status: DC | PRN

## 2023-05-09 NOTE — ED Provider Notes (Signed)
 [place] EMERGENCY DEPARTMENT AT [facility] Provider Note   CSN: [id number] Arrival date & time: 05/09/23  1204     History  Chief Complaint  Patient presents with   Anxiety    [name] is a 63 y.o. female.  63 year old female with a history of anxiety and hypothyroidism who presents emergency department with anxiety and paresthesias.  Patient reports that for several months she has been having paresthesias that go from her head all the way down to her toes.  Says that she was started on fluoxetine  for anxiety because she often feels anxious with this.  Also was given some Ativan  which she has been trying.  Experiences some minimal relief with Ativan .  Does not know if the fluoxetine  is working and was told to recently increase her dosage.  Was seen here yesterday in the emergency department for similar symptoms.  She had a troponin that was undetectably low.  Her chemistry and TSH were within normal limits.       Home Medications Prior to Admission medications   Medication Sig Start Date End Date Taking? Authorizing Provider  FLUoxetine  (PROZAC ) 20 MG capsule Take 1 capsule (20 mg total) by mouth daily. 05/03/23   Tobi Fortes, MD  levothyroxine  (SYNTHROID ) 88 MCG tablet Take 1 tablet (88 mcg total) by mouth daily before breakfast. 10/26/22   Wendel Hals, NP  LORazepam  (ATIVAN ) 0.5 MG tablet Take 1 tablet (0.5 mg total) by mouth 2 (two) times daily as needed for anxiety. 05/09/23   Ninetta Basket, MD  methocarbamol  (ROBAXIN -750) 750 MG tablet Take 1 tablet (750 mg total) by mouth every 8 (eight) hours as needed for muscle spasms. 04/23/23   Alison Irvine, FNP  Multiple Vitamin (MULTIVITAMIN) tablet Take 1 tablet by mouth daily.    [provider]  rosuvastatin  (CRESTOR ) 20 MG tablet TAKE ONE TABLET BY MOUTH ONCE DAILY. 11/22/22   Tobi Fortes, MD      Allergies    Patient has no known allergies.    Review of Systems   Review of  Systems  Physical Exam Updated Vital Signs BP 105/76   Pulse 68   Temp 98.2 F (36.8 C) (Oral)   Resp (!) 23   LMP 01/11/2013   SpO2 95%  Physical Exam Vitals and nursing note reviewed.  Constitutional:      General: She is not in acute distress.    Appearance: She is well-developed.  HENT:     Head: Normocephalic and atraumatic.     Right Ear: External ear normal.     Left Ear: External ear normal.     Nose: Nose normal.  Eyes:     Extraocular Movements: Extraocular movements intact.     Conjunctiva/sclera: Conjunctivae normal.     Pupils: Pupils are equal, round, and reactive to light.  Cardiovascular:     Rate and Rhythm: Normal rate and regular rhythm.     Heart sounds: No murmur heard. Pulmonary:     Effort: Pulmonary effort is normal. No respiratory distress.     Breath sounds: Normal breath sounds.  Musculoskeletal:     Cervical back: Normal range of motion and neck supple.     Right lower leg: No edema.     Left lower leg: No edema.  Skin:    General: Skin is warm and dry.  Neurological:     Mental Status: She is alert.     Comments: MENTAL STATUS: AAOx3 CRANIAL NERVES: II:  Pupils equal and reactive 4 mm BL, no RAPD, no VF deficits III, IV, VI: EOM intact, no gaze preference or deviation, no nystagmus. V: normal sensation to light touch in V1, V2, and V3 segments bilaterally VII: no facial weakness or asymmetry, no nasolabial fold flattening VIII: normal hearing to speech and finger friction IX, X: normal palatal elevation, no uvular deviation XI: 5/5 head turn and 5/5 shoulder shrug bilaterally XII: midline tongue protrusion MOTOR: 5/5 strength in R shoulder flexion, elbow flexion and extension, and grip strength. 5/5 strength in L shoulder flexion, elbow flexion and extension, and grip strength.  5/5 strength in R hip and knee flexion, knee extension, ankle plantar and dorsiflexion. 5/5 strength in L hip and knee flexion, knee extension, ankle plantar and  dorsiflexion. SENSORY: Normal sensation to light touch in all extremities COORD: Normal finger to nose and heel to shin, no tremor, no dysmetria STATION: No truncal ataxia   Psychiatric:        Mood and Affect: Mood normal.     ED Results / Procedures / Treatments   Labs (all labs ordered are listed, but only abnormal results are displayed) Labs Reviewed - No data to display  EKG None  Radiology No results found.  Procedures Procedures    Medications Ordered in ED Medications - No data to display  ED Course/ Medical Decision Making/ A&P                                 Medical Decision Making Risk Prescription drug management.   [name] is a 62 y.o. female with comorbidities that complicate the patient evaluation including anxiety and hypothyroidism presents to the emergency department with paresthesias  Initial Ddx:  Panic attacks, anxiety, hypocalcemia, seizure, electrolyte abnormality  MDM/Course:  Patient presents to the emergency department with anxiety and paresthesias.  Reports these events started her head and work on the way down to her feet.  Is been going on for months.  Does feel very anxious when these happen.  Was recently started on fluoxetine  as well and has a prescription of Ativan  to take.  Was seen yesterday in the emergency department and has been seen several other times.  Has already had her thyroid  checked, EKG, troponin, and blood work which showed normal calcium  and electrolytes.  On exam her vital signs are reassuring.  She has a nonfocal neurologic exam.  Given the bilateral nature of the symptoms feel that seizure is highly unlikely.  Also an atypical presentation for MS.  Suspect that it is likely due to panic attacks.  Patient given small refill on her Ativan  since she is almost out and instructed to follow-up with psychiatry and her primary doctor as an outpatient.  Also gave her information for neurology to discuss with them as well.     This patient presents to the ED for concern of complaints listed in HPI, this involves an extensive number of treatment options, and is a complaint that carries with it a high risk of complications and morbidity. Disposition including potential need for admission considered.   Dispo: DC Home. Return precautions discussed including, but not limited to, those listed in the AVS. Allowed pt time to ask questions which were answered fully prior to dc.  Additional history obtained from family Records reviewed Outpatient Clinic Notes I have reviewed the patients home medications and made adjustments as needed  Portions of this note were generated  with Scientist, clinical (histocompatibility and immunogenetics). Dictation errors may occur despite best attempts at proofreading.     Final Clinical Impression(s) / ED Diagnoses Final diagnoses:  Anxiety  Paresthesias    Rx / DC Orders ED Discharge Orders          Ordered    LORazepam  (ATIVAN ) 0.5 MG tablet  2 times daily PRN        05/09/23 1338              Ninetta Basket, MD 05/09/23 1627

## 2023-05-09 NOTE — Discharge Instructions (Addendum)
 You were seen for your nausea and vomiting in the emergency department.   At home, please take the ativan  for your anxiety.    Check your MyChart online for the results of any tests that had not resulted by the time you left the emergency department.   Follow-up with your primary doctor in 2-3 days regarding your visit.  Follow-up with neurology as an outpatient as well about your paresthesias.  Return immediately to the emergency department if you experience any of the following: Arm weakness, leg weakness, facial droop, difficulty speaking, vision changes, or any other concerning symptoms.    Thank you for visiting our Emergency Department. It was a pleasure taking care of you today.

## 2023-05-09 NOTE — ED Triage Notes (Signed)
 Pt recently dx'd with anxiety back in January and has started a new medication for it.  Pt states medication is not working. Feels like her throat is tightening up with taking it. Pt c/o mild pressure to right side of her face.

## 2023-05-10 ENCOUNTER — Ambulatory Visit: Payer: Self-pay

## 2023-05-10 DIAGNOSIS — F419 Anxiety disorder, unspecified: Secondary | ICD-10-CM

## 2023-05-10 MED ORDER — LORAZEPAM 0.5 MG PO TABS: 0.5000 mg | ORAL_TABLET | Freq: Two times a day (BID) | ORAL | 0 refills | Status: DC | PRN

## 2023-05-10 MED ORDER — ONDANSETRON 4 MG PO TBDP: 4.0000 mg | ORAL_TABLET | Freq: Three times a day (TID) | ORAL | 2 refills | Status: DC | PRN

## 2023-05-10 NOTE — Progress Notes (Signed)
 Established Patient Office Visit  Subjective   Patient ID: [name], female    DOB: July 01, 1960  Age: 63 y.o. MRN: [id number]  Chief Complaint  Patient presents with   Hospitalization Follow-up    Pts here for a hospital follow up, needs a refill on her Lorazepam      HPI  She was seen at [facility] on 05/09/23 for the following:  63 year old female with a history of anxiety and hypothyroidism who presents emergency department with anxiety and paresthesias. Patient reports that for several months she has been having paresthesias that go from her head all the way down to her toes. Says that she was started on fluoxetine  for anxiety because she often feels anxious with this. Also was given some Ativan  which she has been trying. Experiences some minimal relief with Ativan . Does not know if the fluoxetine  is working and was told to recently increase her dosage. Was seen here yesterday in the emergency department for similar symptoms. She had a troponin that was undetectably low. Her chemistry and TSH were within normal limits.   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   Neck pain 02/02/2023   Cervical pain (neck) 01/31/2023   Tingling in extremities 01/31/2023   Encounter for examination following treatment at hospital 01/31/2023   Tobacco use 10/11/2021   Encounter to establish care 10/11/2021   Prediabetes 03/31/2019   Hyperlipidemia 09/23/2015   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  Constitutional:      Appearance: Normal appearance.  Eyes:     Extraocular Movements: Extraocular movements intact.     Pupils: Pupils are equal, round, and reactive to light.  Cardiovascular:     Rate and Rhythm: Normal rate and regular rhythm.  Pulmonary:     Effort: Pulmonary effort is normal.     Breath sounds: Normal breath sounds.  Neurological:     Mental Status: She is alert and oriented to person, place, and time.  Psychiatric:        Attention and Perception: Attention normal.        Mood and Affect: Mood is anxious. Mood is not depressed.        Speech: Speech normal.        Behavior: Behavior normal.        Thought Content: Thought content normal.        Cognition and Memory: Cognition normal.      No results found for any visits on 05/10/23.    The 10-year ASCVD risk score (Arnett DK, et al., 2019) is: 2.4%    Assessment & Plan:   Problem List Items Addressed This Visit  Other   Anxiety   Stable with lorazepam  as needed.  She has only been on the Prozac  for a week.  Recommend revaluating effectiveness in 3-4 weeks.   Lorazepam  refilled for prn use.   PDMP reviewed during this encounter.       Relevant Medications   LORazepam  (ATIVAN ) 0.5 MG tablet    No follow-ups on file.    Alison Irvine, FNP

## 2023-05-11 NOTE — Assessment & Plan Note (Signed)
 Stable with lorazepam  as needed.  She has only been on the Prozac  for a week.  Recommend revaluating effectiveness in 3-4 weeks.   Lorazepam  refilled for prn use.   PDMP reviewed during this encounter.

## 2023-05-16 ENCOUNTER — Ambulatory Visit: Payer: Self-pay | Admitting: Internal Medicine

## 2023-05-16 NOTE — Telephone Encounter (Signed)
 Chief Complaint: Side effects of medication (Prozac  20 mg started on 5/1)  Symptoms: Nausea, lack of appetite, tired, burning sensation from head down chest and arms (ongoing issue she has been seen for this)   Disposition: [x]  Follow-up with PCP  Additional Notes: Patient started taking Prozac  20 mg on 5/1. Pt states she is having side effects such as nausea, lack of appetite, and feeling tired since starting this. Pt states the ondansetron  is not helping with the nausea. Pt would like to know what Dr. Kermit Ped would like for her to do since this medication is causing adverse side effects.   Copied from CRM [id number]. Topic: Clinical - Medical Advice >> May 16, 2023  4:21 PM [name] wrote: Reason for CRM: The prescription prescribed for anxiety is working but patient is experiencing a loss of appetite and having no energy. The most concerning to patient is the loss of appetite. Patient stated she just doesn't feel well and like herself. Reason for Disposition  [1] Caller has NON-URGENT medicine question about med that PCP prescribed AND [2] triager unable to answer question  Answer Assessment - Initial Assessment Questions Chief Complaint: Side effects of medication (Prozac  20 mg started on 5/1)  Symptoms: Nausea, lack of appetite, tired, burning sensation from head down chest and arms (ongoing issue she has been seen for this)  Protocols used: Medication Question Call-A-AH

## 2023-05-17 ENCOUNTER — Ambulatory Visit: Payer: Self-pay

## 2023-05-17 NOTE — Telephone Encounter (Signed)
 Pt states that she has been experiencing burning in her chest off and on for about a week since starting the Prozac . States she has lost her appetite and has no energy. States the only relief she gets is taking the xanax that was prescribed and she sometimes takes two of those. Pls advise

## 2023-05-17 NOTE — Telephone Encounter (Signed)
Pt informed

## 2023-05-17 NOTE — Telephone Encounter (Signed)
  Chief Complaint: medication side effects  Pertinent Negatives: Patient denies new symtpoms Disposition: [] ED /[] Urgent Care (no appt availability in office) / [] Appointment(In office/virtual)/ []  [place] [facility]/ [] Home Care/ [] Refused Recommended Disposition /[] [place] [facility]/ [x]  Follow-up with PCP Additional Notes: pt states that she has been experiencing burning in her chest off and on for about a week since starting the Prozac . States she has lost her appetite and has not energy. States the only relief she gets is taking the xanax that was prescribed and she sometimes takes two of those.   Copied from CRM [id number]. Topic: Clinical - Red Word Triage >> May 17, 2023 10:10 AM Marissa P wrote: Red Word that prompted transfer to Nurse Triage: Patient called would like someone up to follow up soon as possible please from yesterdays request: The prescription prescribed for anxiety is working but patient is experiencing a loss of appetite and having no energy. The most concerning to patient is the loss of appetite. Patient stated she just doesn't feel well and like herself. She called in today saying she is currently experiencing a burning sensation in her chest and little bit of chest pain. Reason for Disposition  [1] Caller has NON-URGENT medicine question about med that PCP prescribed AND [2] triager unable to answer question  Answer Assessment - Initial Assessment Questions 1. NAME of MEDICINE: "What medicine(s) are you calling about?"     prozac  2. QUESTION: "What is your question?" (e.g., double dose of medicine, side effect)     Side effects  Protocols used: Medication Question Call-A-AH

## 2023-05-18 ENCOUNTER — Other Ambulatory Visit: Payer: Self-pay

## 2023-05-18 ENCOUNTER — Ambulatory Visit: Payer: Self-pay

## 2023-05-18 ENCOUNTER — Telehealth: Payer: Self-pay | Admitting: Internal Medicine

## 2023-05-18 DIAGNOSIS — F419 Anxiety disorder, unspecified: Secondary | ICD-10-CM

## 2023-05-18 MED ORDER — SERTRALINE HCL 25 MG PO TABS: 25.0000 mg | ORAL_TABLET | Freq: Every day | ORAL | 3 refills | Status: DC

## 2023-05-18 NOTE — Telephone Encounter (Signed)
 Patient is concerned about only having lorazepam  know that fluoxetine  has been stopped. Patient initially called about eventually needing a med refill for lorazepam  but has plenty of medication currently. Patient states she is doing fine with no current concerns.  Has an appointment scheduled with Behavioral health in July. Gave patient information about 24 hour behavioral health urgent care located in [place]. Patient verbalized understanding and no other concerns at this time.   Reason for Disposition  Caller has medicine question only, adult not sick, AND triager answers question  Answer Assessment - Initial Assessment Questions 1. NAME of MEDICINE: "What medicine(s) are you calling about?"     Lorazepam  2. QUESTION: "What is your question?" (e.g., double dose of medicine, side effect)     Patient was told to stop fluoxetine  and is concerned about only having ativan  available to help with her anxiety.   3. PRESCRIBER: "Who prescribed the medicine?" Reason: if prescribed by specialist, call should be referred to that group.     [place] NP 4. SYMPTOMS: "Do you have any symptoms?" If Yes, ask: "What symptoms are you having?"  "How bad are the symptoms (e.g., mild, moderate, severe)     No symptoms currently  Protocols used: Medication Question Call-A-AH

## 2023-05-18 NOTE — Telephone Encounter (Unsigned)
 Copied from CRM [id number]. Topic: Clinical - Medication Refill >> May 18, 2023  9:21 AM Oddis Bench wrote: Medication: LORazepam  (ATIVAN ) 0.5 MG table  Has the patient contacted their pharmacy? No There are no refills on the meds  This is the patient's preferred pharmacy:  [facility] - [place], [place] - [address] [place] [address] Phone: [phone] Fax: [phone]    Is this the correct pharmacy for this prescription? Yes If no, delete pharmacy and type the correct one.   Has the prescription been filled recently? No  Is the patient out of the medication? No  Has the patient been seen for an appointment in the last year OR does the patient have an upcoming appointment? Yes  Can we respond through MyChart? Yes  Agent: Please be advised that Rx refills may take up to 3 business days. We ask that you follow-up with your pharmacy. Patient is stating that she has to double the dosage at night for it to work for her.

## 2023-05-18 NOTE — Telephone Encounter (Signed)
 Pharmacy called to verify that patient picked up Lorazepam  prescription on 05/10/2023 which pharmacy tech states per records patient did pick up order  Patient called and triage completed.

## 2023-05-18 NOTE — Telephone Encounter (Signed)
 Pt asking what you recommend in replace of the Prozac , States her main issue is anxiety. Asked about Valium to take when she's feeling like this.

## 2023-05-22 ENCOUNTER — Telehealth: Payer: Self-pay

## 2023-05-22 NOTE — Telephone Encounter (Signed)
 Please advise, no openings til Friday 05/23 at 8:00 am. Medicine making her have no appetite and not eating. Does her provider need her to be worked into his schedule

## 2023-05-22 NOTE — Telephone Encounter (Signed)
 Copied from CRM [id number]. Topic: Clinical - Prescription Issue >> May 22, 2023  8:45 AM Oddis Bench wrote: Reason for CRM: Patient is calling about having side effects from sertraline  (ZOLOFT ) 25 MG tablet nausea and she is stating that the only relief she has is from the LORazepam  (ATIVAN ) 0.5 MG tablet. She is stating that she has to take one or two per day and 2 to fall asleep at night.

## 2023-05-23 NOTE — Telephone Encounter (Signed)
 Patient sched thurs 5/22 at 11:40

## 2023-05-24 ENCOUNTER — Encounter: Payer: Self-pay | Admitting: Internal Medicine

## 2023-05-24 ENCOUNTER — Telehealth (INDEPENDENT_AMBULATORY_CARE_PROVIDER_SITE_OTHER): Admitting: Internal Medicine

## 2023-05-24 ENCOUNTER — Ambulatory Visit: Payer: Self-pay

## 2023-05-24 DIAGNOSIS — F419 Anxiety disorder, unspecified: Secondary | ICD-10-CM

## 2023-05-24 DIAGNOSIS — Z0389 Encounter for observation for other suspected diseases and conditions ruled out: Secondary | ICD-10-CM | POA: Diagnosis not present

## 2023-05-24 NOTE — Assessment & Plan Note (Signed)
 Evaluated today for an acute visit through video encounter in the setting of poorly controlled anxiety.  She has experienced adverse side effects with both Prozac  and Zoloft .  Currently taking lorazepam  0.5 mg multiple times daily as needed for anxiety relief.  PDMP reviewed.  Her prescription was last filled 5/8 as 60 tablets (30-day supply).  She says that she has 10 tablets left.  We reviewed the importance of taking lorazepam  as prescribed and not to exceed more than 2 tablets/day.  No additional medications were prescribed today.  She will establish care with psychiatry next week.  She is scheduled for routine follow-up at [facility] in July.

## 2023-05-24 NOTE — Telephone Encounter (Signed)
 Patient scheduled.

## 2023-05-24 NOTE — Progress Notes (Signed)
 Virtual Visit via Video Note  I connected with RUBENA ROSEMAN on 05/24/23 at  2:00 PM EDT by a video enabled telemedicine application and verified that I am speaking with the correct person using two identifiers.  Patient Location: Home Provider Location: Office/Clinic  I discussed the limitations, risks, security, and privacy concerns of performing an evaluation and management service by video and the availability of in person appointments. I also discussed with the patient that there may be a patient responsible charge related to this service. The patient expressed understanding and agreed to proceed.  Subjective: PCP: Tobi Fortes, MD  Chief Complaint  Patient presents with   Medication Problem    Side effects from Zoloft  , no appetite and no energy    Ms. Azizi has been evaluated for an acute visit through video encounter in the setting of persistent anxiety with concern for adverse medication side effect.  She was evaluated by me on 5/1 at which time she endorsed poorly controlled anxiety.  Ultimately fluoxetine  20 mg daily was prescribed and I recommended continuing as needed use of lorazepam  for anxiety relief.  She then presented to the emergency department on 5/6 endorsing anxiety.  Fortunately, her workup was reassuring.  Seen at [facility] for follow-up on 5/8.  She called our office last week with concern about side effects from fluoxetine .  Fluoxetine  was ultimately discontinued in favor of sertraline  25 mg daily.  Today she says that she has experienced side effects of fatigue and loss of appetite since starting sertraline .  She was previously referred to counseling by me and established care earlier today.  She says that she will meet with a psychiatrist at [facility] recovery services next week.  She has stopped sertraline  and has been managing anxiety with lorazepam .  She has taken 1 tablet today, but says that sometimes she takes 4 tablets over the course of the day if she feels  really anxious.  ROS: Per HPI  Current Outpatient Medications:    levothyroxine  (SYNTHROID ) 88 MCG tablet, Take 1 tablet (88 mcg total) by mouth daily before breakfast., Disp: 90 tablet, Rfl: 3   LORazepam  (ATIVAN ) 0.5 MG tablet, Take 1 tablet (0.5 mg total) by mouth 2 (two) times daily as needed for anxiety., Disp: 60 tablet, Rfl: 0   Multiple Vitamin (MULTIVITAMIN) tablet, Take 1 tablet by mouth daily., Disp: , Rfl:    ondansetron  (ZOFRAN -ODT) 4 MG disintegrating tablet, Take 1 tablet (4 mg total) by mouth every 8 (eight) hours as needed for nausea or vomiting., Disp: 20 tablet, Rfl: 2   rosuvastatin  (CRESTOR ) 20 MG tablet, TAKE ONE TABLET BY MOUTH ONCE DAILY., Disp: 90 tablet, Rfl: 1  Assessment and Plan:  Anxiety Assessment & Plan: Evaluated today for an acute visit through video encounter in the setting of poorly controlled anxiety.  She has experienced adverse side effects with both Prozac  and Zoloft .  Currently taking lorazepam  0.5 mg multiple times daily as needed for anxiety relief.  PDMP reviewed.  Her prescription was last filled 5/8 as 60 tablets (30-day supply).  She says that she has 10 tablets left.  We reviewed the importance of taking lorazepam  as prescribed and not to exceed more than 2 tablets/day.  No additional medications were prescribed today.  She will establish care with psychiatry next week.  She is scheduled for routine follow-up at [facility] in July.  Follow Up Instructions: Return if symptoms worsen or fail to improve.   I discussed the assessment and treatment plan  with the patient. The patient was provided an opportunity to ask questions, and all were answered. The patient agreed with the plan and demonstrated an understanding of the instructions.   The patient was advised to call back or seek an in-person evaluation if the symptoms worsen or if the condition fails to improve as anticipated.  The above assessment and management plan was discussed with the patient. The  patient verbalized understanding of and has agreed to the management plan.   Tobi Fortes, MD

## 2023-05-24 NOTE — Telephone Encounter (Signed)
  Chief Complaint: Reschedule missed video visit today, was at recovery center  Symptoms: see TE 5/20 Frequency: Continued Pertinent Negatives: Patient denies see TE 5/20 Disposition: [] ED /[] Urgent Care (no appt availability in office) / [] Appointment(In office/virtual)/ []  [place] [facility]/ [] Home Care/ [] Refused Recommended Disposition /[] [place] [facility]/ []  Follow-up with PCP Additional Notes:  No triage.  Missed video visit today, was at [facility] recovery center as suggested by PCP.  Calling to reschedule, needs today. Medication prescribed is not working. Has psychiatrist appt next week.  Need refill of lorazepam  before next appointment.  This Clinical research associate reviewed appointments, none are available today. Practice location is closed at this time for lunch hour. Patient requesting to be fit in today for video visit with Dr. Kermit Ped. Requesting follow up from clinic staff. Please follow up.    Copied from CRM [id number]. Topic: Clinical - Red Word Triage >> May 24, 2023 12:12 PM Fonda T wrote: [facility] that prompted transfer to Nurse Triage: Increased Anxiety Reason for Disposition . Caller requesting an appointment, triage offered and declined  Protocols used: PCP Call - No Triage-A-AH

## 2023-05-25 ENCOUNTER — Emergency Department (HOSPITAL_COMMUNITY)

## 2023-05-25 ENCOUNTER — Telehealth: Admitting: Family Medicine

## 2023-05-25 ENCOUNTER — Other Ambulatory Visit: Payer: Self-pay | Admitting: Internal Medicine

## 2023-05-25 ENCOUNTER — Encounter (HOSPITAL_COMMUNITY): Payer: Self-pay | Admitting: Emergency Medicine

## 2023-05-25 ENCOUNTER — Emergency Department (HOSPITAL_COMMUNITY)
Admission: EM | Admit: 2023-05-25 | Discharge: 2023-05-25 | Disposition: A | Discharge status: Home / Self Care | Attending: Emergency Medicine | Admitting: Emergency Medicine

## 2023-05-25 ENCOUNTER — Other Ambulatory Visit: Payer: Self-pay

## 2023-05-25 DIAGNOSIS — R079 Chest pain, unspecified: Secondary | ICD-10-CM | POA: Diagnosis not present

## 2023-05-25 DIAGNOSIS — F419 Anxiety disorder, unspecified: Secondary | ICD-10-CM | POA: Insufficient documentation

## 2023-05-25 DIAGNOSIS — M542 Cervicalgia: Secondary | ICD-10-CM | POA: Diagnosis not present

## 2023-05-25 DIAGNOSIS — R0789 Other chest pain: Secondary | ICD-10-CM | POA: Diagnosis not present

## 2023-05-25 DIAGNOSIS — E782 Mixed hyperlipidemia: Secondary | ICD-10-CM

## 2023-05-25 DIAGNOSIS — R072 Precordial pain: Secondary | ICD-10-CM | POA: Diagnosis not present

## 2023-05-25 DIAGNOSIS — M47812 Spondylosis without myelopathy or radiculopathy, cervical region: Secondary | ICD-10-CM | POA: Diagnosis not present

## 2023-05-25 DIAGNOSIS — M7918 Myalgia, other site: Secondary | ICD-10-CM | POA: Diagnosis not present

## 2023-05-25 LAB — CBC
HCT: 46 % (ref 36.0–46.0)
Hemoglobin: 15.4 g/dL — ABNORMAL HIGH (ref 12.0–15.0)
MCH: 30 pg (ref 26.0–34.0)
MCHC: 33.5 g/dL (ref 30.0–36.0)
MCV: 89.7 fL (ref 80.0–100.0)
Platelets: 298 10*3/uL (ref 150–400)
RBC: 5.13 MIL/uL — ABNORMAL HIGH (ref 3.87–5.11)
RDW: 12.3 % (ref 11.5–15.5)
WBC: 7.4 10*3/uL (ref 4.0–10.5)
nRBC: 0 % (ref 0.0–0.2)

## 2023-05-25 LAB — COMPREHENSIVE METABOLIC PANEL WITH GFR
ALT: 26 U/L (ref 0–44)
AST: 18 U/L (ref 15–41)
Albumin: 4.1 g/dL (ref 3.5–5.0)
Alkaline Phosphatase: 46 U/L (ref 38–126)
Anion gap: 7 (ref 5–15)
BUN: 12 mg/dL (ref 6–20)
CO2: 26 mmol/L (ref 22–32)
Calcium: 9.2 mg/dL (ref 8.9–10.3)
Chloride: 103 mmol/L (ref 98–111)
Creatinine, Ser: 0.56 mg/dL (ref 0.44–1.00)
GFR, Estimated: >60 mL/min (ref >60)
Glucose, Bld: 109 mg/dL — ABNORMAL HIGH (ref 70–99)
Potassium: 4.2 mmol/L (ref 3.5–5.1)
Sodium: 136 mmol/L (ref 135–145)
Total Bilirubin: 0.5 mg/dL (ref 0.0–1.2)
Total Protein: 7.1 g/dL (ref 6.5–8.1)

## 2023-05-25 LAB — TROPONIN I (HIGH SENSITIVITY): Troponin I (High Sensitivity): 2 ng/L (ref <18)

## 2023-05-25 MED ORDER — ALUM & MAG HYDROXIDE-SIMETH 200-200-20 MG/5ML PO SUSP
30.0000 mL | Freq: Once | ORAL | Status: AC
  Administered 2023-05-25: 30 mL via ORAL
  Filled 2023-05-25: qty 30

## 2023-05-25 MED ORDER — HYDROXYZINE HCL 25 MG PO TABS: 25.0000 mg | ORAL_TABLET | Freq: Three times a day (TID) | ORAL | 0 refills | Status: DC | PRN

## 2023-05-25 MED ORDER — ACETAMINOPHEN 500 MG PO TABS
1000.0000 mg | ORAL_TABLET | Freq: Once | ORAL | Status: AC
Start: 2023-05-25 — End: 2023-05-25
  Administered 2023-05-25: 1000 mg via ORAL
  Filled 2023-05-25: qty 2

## 2023-05-25 MED ORDER — HYDROXYZINE HCL 25 MG PO TABS
25.0000 mg | ORAL_TABLET | Freq: Once | ORAL | Status: AC
  Administered 2023-05-25: 25 mg via ORAL
  Filled 2023-05-25: qty 1

## 2023-05-25 MED ORDER — FAMOTIDINE 20 MG PO TABS
20.0000 mg | ORAL_TABLET | Freq: Once | ORAL | Status: AC
  Administered 2023-05-25: 20 mg via ORAL
  Filled 2023-05-25: qty 1

## 2023-05-25 NOTE — ED Triage Notes (Signed)
 Pt here today by POV w/ hx of anxiety diagnosed in january. Pt states she has been on 3 different meds for it. States her symptoms are chest burning and a "sensation" that runs from her head to her toes. States she had that sensation and chest burning this a.m but denies any symptoms at this time.

## 2023-05-25 NOTE — Discharge Instructions (Signed)
 It was our pleasure to provide your ER care today - we hope that you feel better.  If GI/reflux symptoms, try taking pepcid and maalox as need for symptom relief.   For anxiety, continue your ativan . You may also take hydroxyzine  as need for anxiety - no driving when taking.  Also follow up closely with your primary care doctor and/or therapy/counselor in the next 1-2 weeks.   For recent chest discomfort, follow up closely with cardiologist in the next 1-2 weeks - we made referral, and they should be contacting you with an appointment in the next few days.   Return to ER right away if worse, new symptoms, fevers, recurrent/persistent chest pain, increased trouble breathing, or other concern.

## 2023-05-25 NOTE — ED Provider Notes (Signed)
 [place] EMERGENCY DEPARTMENT AT [facility] Provider Note   CSN: [id number] Arrival date & time: 05/25/23  0747     History  Chief Complaint  Patient presents with   Anxiety    [name] is a 63 y.o. female.  Pt with hx anxiety, indicates chest burning sensation in past few days, midline, mid chest, non radiating, not pleuritic, at rest. No exertional chest pain. No pleuritic chest pain. No associated nv, diaphoresis or sob. No hx cad. No leg pain or swelling. No hx dvt or pe. Denies new or unusual stressors. Has recently seen pcp and counselor regarding anxiety - takes ativan  2x/day. Tried prozac  and zoloft  but that did not seem to help. Is eating/drinking. Occasional trouble sleeping at night. Denies acute depression. No fever or chills.   The history is provided by the patient and medical records.  Anxiety Associated symptoms include chest pain. Pertinent negatives include no abdominal pain, no headaches and no shortness of breath.       Home Medications Prior to Admission medications   Medication Sig Start Date End Date Taking? Authorizing Provider  levothyroxine  (SYNTHROID ) 88 MCG tablet Take 1 tablet (88 mcg total) by mouth daily before breakfast. 10/26/22   Wendel Hals, NP  LORazepam  (ATIVAN ) 0.5 MG tablet Take 1 tablet (0.5 mg total) by mouth 2 (two) times daily as needed for anxiety. 05/10/23 06/09/23  Alison Irvine, FNP  Multiple Vitamin (MULTIVITAMIN) tablet Take 1 tablet by mouth daily.    [provider]  ondansetron  (ZOFRAN -ODT) 4 MG disintegrating tablet Take 1 tablet (4 mg total) by mouth every 8 (eight) hours as needed for nausea or vomiting. 05/10/23   Alison Irvine, FNP  rosuvastatin  (CRESTOR ) 20 MG tablet TAKE ONE TABLET BY MOUTH ONCE DAILY. 05/25/23   Dixon, Phillip E, MD      Allergies    Patient has no known allergies.    Review of Systems   Review of Systems  Constitutional:  Negative for chills and fever.  HENT:  Negative  for sore throat.   Eyes:  Negative for visual disturbance.  Respiratory:  Negative for cough and shortness of breath.   Cardiovascular:  Positive for chest pain. Negative for palpitations and leg swelling.  Gastrointestinal:  Negative for abdominal pain, diarrhea and vomiting.  Genitourinary:  Negative for dysuria and flank pain.  Musculoskeletal:  Negative for back pain.  Skin:  Negative for rash.  Neurological:  Negative for speech difficulty, weakness, numbness and headaches.  Psychiatric/Behavioral:  The patient is nervous/anxious.     Physical Exam Updated Vital Signs BP 119/77   Pulse 66   Temp (P) 98 F (36.7 C) (Oral)   Resp 18   Ht 1.626 m (5\' 4" )   Wt 68 kg   LMP 01/11/2013   SpO2 97%   BMI 25.73 kg/m  Physical Exam Vitals and nursing note reviewed.  Constitutional:      Appearance: Normal appearance. She is well-developed.  HENT:     Head: Atraumatic.     Nose: Nose normal.     Mouth/Throat:     Mouth: Mucous membranes are moist.  Eyes:     General: No scleral icterus.    Conjunctiva/sclera: Conjunctivae normal.     Pupils: Pupils are equal, round, and reactive to light.  Neck:     Vascular: No carotid bruit.     Trachea: No tracheal deviation.     Comments: Trachea midline, thyroid  not grossly enlarged or tender.  Cardiovascular:     Rate and Rhythm: Normal rate and regular rhythm.     Pulses: Normal pulses.     Heart sounds: Normal heart sounds. No murmur heard.    No friction rub. No gallop.  Pulmonary:     Effort: Pulmonary effort is normal. No respiratory distress.     Breath sounds: Normal breath sounds.  Chest:     Chest wall: No tenderness.  Abdominal:     General: Bowel sounds are normal. There is no distension.     Palpations: Abdomen is soft. There is no mass.     Tenderness: There is no abdominal tenderness.  Genitourinary:    Comments: No cva tenderness.  Musculoskeletal:        General: No swelling or tenderness.     Cervical back:  Normal range of motion and neck supple. No rigidity or tenderness. No muscular tenderness.  Skin:    General: Skin is warm and dry.     Findings: No rash.  Neurological:     Mental Status: She is alert.     Comments: Alert, speech normal. Motor/sens grossly intact bil.   Psychiatric:     Comments: Mildly anxious appearing.      ED Results / Procedures / Treatments   Labs (all labs ordered are listed, but only abnormal results are displayed) Results for orders placed or performed during the hospital encounter of 05/25/23  CBC   Collection Time: 05/25/23  8:51 AM  Result Value Ref Range   WBC 7.4 4.0 - 10.5 K/uL   RBC 5.13 (H) 3.87 - 5.11 MIL/uL   Hemoglobin 15.4 (H) 12.0 - 15.0 g/dL   HCT 29.5 62.1 - 30.8 %   MCV 89.7 80.0 - 100.0 fL   MCH 30.0 26.0 - 34.0 pg   MCHC 33.5 30.0 - 36.0 g/dL   RDW 65.7 84.6 - 96.2 %   Platelets 298 150 - 400 K/uL   nRBC 0.0 0.0 - 0.2 %  Comprehensive metabolic panel with GFR   Collection Time: 05/25/23  8:51 AM  Result Value Ref Range   Sodium 136 135 - 145 mmol/L   Potassium 4.2 3.5 - 5.1 mmol/L   Chloride 103 98 - 111 mmol/L   CO2 26 22 - 32 mmol/L   Glucose, Bld 109 (H) 70 - 99 mg/dL   BUN 12 6 - 20 mg/dL   Creatinine, Ser 9.52 0.44 - 1.00 mg/dL   Calcium  9.2 8.9 - 10.3 mg/dL   Total Protein 7.1 6.5 - 8.1 g/dL   Albumin 4.1 3.5 - 5.0 g/dL   AST 18 15 - 41 U/L   ALT 26 0 - 44 U/L   Alkaline Phosphatase 46 38 - 126 U/L   Total Bilirubin 0.5 0.0 - 1.2 mg/dL   GFR, Estimated >84 >13 mL/min   Anion gap 7 5 - 15  Troponin I (High Sensitivity)   Collection Time: 05/25/23  8:51 AM  Result Value Ref Range   Troponin I (High Sensitivity) 2 <18 ng/L      EKG EKG Interpretation Date/Time:  Friday May 25 2023 08:09:11 EDT Ventricular Rate:  67 PR Interval:  153 QRS Duration:  80 QT Interval:  381 QTC Calculation: 403 R Axis:   33  Text Interpretation: Sinus rhythm Low voltage, precordial leads No significant change since last  tracing Confirmed by Guadalupe Lee ([id number]) on 05/25/2023 8:11:52 AM  Radiology DG Chest Port 1 View Result Date: 05/25/2023 CLINICAL DATA:  Chest pain. EXAM:  PORTABLE CHEST 1 VIEW COMPARISON:  01/28/2023. FINDINGS: Bilateral lung fields are clear. Bilateral costophrenic angles are clear. Normal cardio-mediastinal silhouette. No acute osseous abnormalities. The soft tissues are within normal limits. IMPRESSION: No active disease. Electronically Signed   By: Beula Brunswick M.D.   On: 05/25/2023 09:10    Procedures Procedures    Medications Ordered in ED Medications  famotidine (PEPCID) tablet 20 mg (20 mg Oral Given 05/25/23 0911)  alum & mag hydroxide-simeth (MAALOX/MYLANTA) 200-200-20 MG/5ML suspension 30 mL (30 mLs Oral Given 05/25/23 0911)  acetaminophen  (TYLENOL ) tablet 1,000 mg (1,000 mg Oral Given 05/25/23 0911)  hydrOXYzine  (ATARAX ) tablet 25 mg (25 mg Oral Given 05/25/23 0911)    ED Course/ Medical Decision Making/ A&P                                 Medical Decision Making Problems Addressed: Anxiety: acute illness or injury    Details: Acute/chronic Precordial chest pain: acute illness or injury with systemic symptoms that poses a threat to life or bodily functions  Amount and/or Complexity of Data Reviewed Independent Historian:     Details: Family member, hx External Data Reviewed: notes. Labs: ordered. Decision-making details documented in ED Course. Radiology: ordered and independent interpretation performed. Decision-making details documented in ED Course. ECG/medicine tests: ordered and independent interpretation performed. Decision-making details documented in ED Course.  Risk OTC drugs. Prescription drug management. Decision regarding hospitalization.   Iv ns. Continuous pulse ox and cardiac monitoring. Labs ordered/sent. Imaging ordered.   Differential diagnosis includes ACS, msk cp, gi cp, gerd, anxiety, etc. Dispo decision including potential need for  admission considered - will get labs and imaging and reassess.   Reviewed nursing notes and prior charts for additional history. External reports reviewed.   Cardiac monitor: sinus rhythm, rate 66.  Gi meds for symptom relief, acetaminophen  po, vistaril  po.   Labs reviewed/interpreted by me - wbc and hct normal. Trop normal - after constant symptoms in last day, felt not c/w acs.   Xrays reviewed/interpreted by me - no pna.   Recheck, pt calm, comfortable appearing, no acute distress.   Pt currently appears stable for ED d/c. Rec close pcp f/u.   Return precautions provided.          Final Clinical Impression(s) / ED Diagnoses Final diagnoses:  None    Rx / DC Orders ED Discharge Orders     None         Guadalupe Lee, MD 05/25/23 1239

## 2023-05-29 ENCOUNTER — Ambulatory Visit: Payer: Self-pay | Admitting: *Deleted

## 2023-05-29 ENCOUNTER — Other Ambulatory Visit: Payer: Self-pay | Admitting: Internal Medicine

## 2023-05-29 MED ORDER — HYDROXYZINE PAMOATE 25 MG PO CAPS: 25.0000 mg | ORAL_CAPSULE | Freq: Three times a day (TID) | ORAL | 0 refills | Status: DC | PRN

## 2023-05-29 NOTE — Telephone Encounter (Signed)
 Appointment made

## 2023-05-29 NOTE — Telephone Encounter (Signed)
 Copied from CRM [id number]. Topic: Clinical - Red Word Triage >> May 29, 2023  8:55 AM Alyse July wrote: Red Word that prompted transfer to Nurse Triage: burning(face,arm and chest)  I'm burning inside of me.  It's been going on at least a week and a half to 2 weeks.   It starts at my face and go to my arms and chest.  I wake up with it.   Like now my left arm is cool sensation feeling and makes me have nausea.   This goes on all day off and on.    I went to the ED Friday morning because this was happening and I was having chest pain.   They worked me up.   MyChart blood work is abnormal but the dr did not say anything about it.   My EKG was good.   No heart attack.   The CBC, Troponin was high sensation.     Dr. Kermit Ped got on to me on a video Thur. About taking too much Lorazepam .  I'm trying not to take it.    I've been dealing with this since the Feb. 1st me and Dr. Kermit Ped.    I can't be seen at [facility] due to me being on Lorazepam .  On Sunday my left leg from knee down is feeling weird.  The inside of my ankle is a little puffiness.      Reason for Disposition  [1] MODERATE pain (e.g., interferes with normal activities) AND [2] present > 3 days    "I'm burning on the inside".   See notes  Answer Assessment - Initial Assessment Questions 1. ONSET: "When did the muscle aches or body pains start?"      Pt called in c/o "burning on the inside".  It starts at my face and goes into my arms and chest.   I wake up with it too.  This goes on all day on and off.  Like now my left arm is feeling a cool sensation makes me have nausea.   Went to the ED 05/25/2023 Friday morning because this was happening and I was having chest pain.  They worked me up and everything was ok.   When I look at my MyChart there are a lot of abnormal blood tests.   The ED doctor didn't say anything to me about those.   My EKG was ok and he said I was not having a heart attack.  They didn't know what was going on with me. I did a  video visit with Dr. Kermit Ped and he told me I was taking too much Lorazepam .   "I'm trying not to take it".   I've been dealing with this since Feb. 1st,  of this year.  Dr. Kermit Ped wants me to go to [facility] but they would not see me because I'm on Lorazepam .   "I don't think that place is for me, anyway".    "They see people addicted to alcohol and stuff".   2. LOCATION: "What part of your body is hurting?" (e.g., entire body, arms, legs)      "I'm just burning on the inside".   It's various places. 3. SEVERITY: "How bad is the pain?" (Scale 1-10; or mild, moderate, severe)   - MILD (1-3): doesn't interfere with normal activities    - MODERATE (4-7): interferes with normal activities or awakens from sleep    - SEVERE (8-10):  excruciating pain, unable to do any normal activities  Severe.    It even wakes me up. 4. CAUSE: "What do you think is causing the pains?"     I don't know.   Dr. Kermit Ped is working with me trying to reduce my Lorazepam  but otherwise no one knows what is going on. 5. FEVER: "Have you been having fever?"     Not asked 6. OTHER SYMPTOMS: "Do you have any other symptoms?" (e.g., chest pain, weakness, rash, cold or flu symptoms, weight loss)     I noticed on Sunday my left leg from the knee down is feeling weird.  The inside of my ankle is a little puffy too.   7. PREGNANCY: "Is there any chance you are pregnant?" "When was your last menstrual period?"     N/A due to age 63. TRAVEL: "Have you traveled out of the country in the last month?" (e.g., travel history, exposures)     N/A  Protocols used: Muscle Aches and Body Pain-A-AH  Chief Complaint: "I'm burning on the inside for at least a week and a half to 2 weeks ago".   I did a video visit with Dr. Kermit Ped.   He told me I was taking too much Lorazepam .   It does help with this but I'm trying not to take it.   I'm really having a hard time.   See triage notes for details.  Seen in ED 05/25/2023.   Everything ok.  Did video visit  last week with Dr. Kermit Ped too.    Symptoms: "Burning on the inside" Frequency: On and off every day even during sleep. Pertinent Negatives: Patient denies knowing what is wrong. Disposition: [] ED /[] Urgent Care (no appt availability in office) / [x] Appointment(In office/virtual)/ []  [facility]/ [] Home Care/ [] Refused Recommended Disposition /[] [facility]/ []  Follow-up with PCP Additional Notes: Pt has a hospital F/U appt on 06/05/2023 but she wanted to be seen sooner due to this "burning on the inside".   I kept the 6/3 appt but also made an appt for 5/28 with Dr. Kermit Ped for 2:20 in office at pt's request.

## 2023-05-29 NOTE — Telephone Encounter (Signed)
 Copied from CRM [id number]. Topic: Clinical - Medication Refill >> May 29, 2023  8:41 AM Alyse July wrote: Medication: hydrOXYzine  (ATARAX ) 25 MG tablet-   Has the patient contacted their pharmacy? Yes (Agent: If no, request that the patient contact the pharmacy for the refill. If patient does not wish to contact the pharmacy document the reason why and proceed with request.) (Agent: If yes, when and what did the pharmacy advise?)  This is the patient's preferred pharmacy:  [facility] - [place], [place] - [address] [place] [address] Phone: [phone] Fax: [phone]  [id number] - [place], [place] - [address] AT [facility] OF [address] & [address] [place] [address] Phone: [phone] Fax: [phone]  Is this the correct pharmacy for this prescription? Yes If no, delete pharmacy and type the correct one.   Has the prescription been filled recently? No  Is the patient out of the medication? No  Has the patient been seen for an appointment in the last year OR does the patient have an upcoming appointment? Yes  Can we respond through MyChart? Yes  Agent: Please be advised that Rx refills may take up to 3 business days. We ask that you follow-up with your pharmacy.

## 2023-05-30 ENCOUNTER — Encounter: Payer: Self-pay | Admitting: Internal Medicine

## 2023-05-30 ENCOUNTER — Ambulatory Visit: Payer: Self-pay | Admitting: Internal Medicine

## 2023-05-30 ENCOUNTER — Other Ambulatory Visit: Payer: Self-pay

## 2023-05-30 VITALS — BP 110/75 | HR 80 | Ht 64.5 in | Wt 147.6 lb

## 2023-05-30 DIAGNOSIS — F419 Anxiety disorder, unspecified: Secondary | ICD-10-CM | POA: Diagnosis not present

## 2023-05-30 DIAGNOSIS — R208 Other disturbances of skin sensation: Secondary | ICD-10-CM | POA: Insufficient documentation

## 2023-05-30 MED ORDER — BUSPIRONE HCL 5 MG PO TABS: 5.0000 mg | ORAL_TABLET | Freq: Two times a day (BID) | ORAL | 2 refills | Status: DC

## 2023-05-30 NOTE — Assessment & Plan Note (Signed)
 Returning to care today for an acute visit in the setting of generalized, intermittent dysesthesias.  Recent ER presentation 5/23 endorsing chest pain.  Fortunately, her cardiac workup was reassuring, though I agree with referral to cardiology for further evaluation given unclear etiology of symptoms.  As noted above, she has recently been evaluated by orthopedic surgery in the setting of chronic neck pain.  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  She has been seen on multiple occasions recently for anxiety, which she still feels is uncontrolled.  She states that she does not feel like herself.  Neurologic exam today is grossly normal.  We reviewed additional treatment/workup items.  Through shared decision making, MRI brain ordered today to exclude intracranial pathology as the underlying cause of her recent symptoms.  She is currently scheduled for follow-up next week (6/3).  Addressing anxiety is otherwise documented.

## 2023-05-30 NOTE — Patient Instructions (Signed)
 It was a pleasure to see you today.  Thank you for giving us  the opportunity to be involved in your care.  Below is a brief recap of your visit and next steps.  We will plan to see you again in June.  Summary Add buspar for anxiety today Check MRI brain Follow up on 6/3

## 2023-05-30 NOTE — Progress Notes (Signed)
 Acute Office Visit  Subjective:     Patient ID: [name], female    DOB: 09-21-1960, 63 y.o.   MRN: [id number]  Chief Complaint  Patient presents with   Follow-up    ER follow up    Ms. Martelle returns to care today for an acute visit in the setting of persistent anxiety and diffuse, intermittent discomfort in her neck, chest, and upper extremities.  She was last evaluated by me 5/22 in the setting of anxiety.  I recommended continuing as needed use of lorazepam  for anxiety relief until she established care with [facility] recovery services, which was reportedly scheduled to take place today (5/28).  Unfortunately, she was notified late last week that she would be unable to establish care since she is prescribed a benzodiazepine.  She is still hopeful to establish care with a psychiatrist.  In the interim, she presented to the emergency department 5/23 endorsing chest pain.  Cardiac workup was reassuring and her symptoms were ultimately attributed to anxiety.  She was also seen by orthopedic surgery later that same day (5/23) for ongoing neck pain.  She additionally endorsed intermittent dysesthesias.  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  Ultimately, her symptoms were not felt to be related to her cervical spine.  She did receive trigger point injections but states that they have not been helpful.  Today she continues to endorse overwhelming anxiety as well as intermittent, general dysesthesias throughout her entire body.  She continues to use lorazepam  and hydroxyzine  as needed for anxiety relief but states that ultimately she does not feel like herself.  She would like to explore additional treatment options.  Review of Systems  Neurological:        Generalized, intermittent dysesthesias  Psychiatric/Behavioral:  The patient is nervous/anxious.       Objective:    BP 110/75   Pulse 80    Ht 5' 4.5" (1.638 m)   Wt 147 lb 9.6 oz (67 kg)   LMP 01/11/2013   SpO2 98%   BMI 24.94 kg/m   Physical Exam Constitutional:      General: She is not in acute distress.    Appearance: Normal appearance. She is not toxic-appearing.  HENT:     Head: Normocephalic and atraumatic.     Right Ear: External ear normal.     Left Ear: External ear normal.     Nose: Nose normal. No congestion or rhinorrhea.     Mouth/Throat:     Mouth: Mucous membranes are moist.     Pharynx: Oropharynx is clear. No oropharyngeal exudate or posterior oropharyngeal erythema.  Eyes:     General: No scleral icterus.    Extraocular Movements: Extraocular movements intact.     Conjunctiva/sclera: Conjunctivae normal.     Pupils: Pupils are equal, round, and reactive to light.  Cardiovascular:     Rate and Rhythm: Normal rate and regular rhythm.     Pulses: Normal pulses.     Heart sounds: Normal heart sounds. No murmur heard.    No friction rub. No gallop.  Pulmonary:     Effort: Pulmonary effort is normal.     Breath sounds: Normal breath sounds. No wheezing, rhonchi or rales.  Abdominal:     General: Abdomen is flat. Bowel sounds are normal. There is no distension.     Palpations: Abdomen is soft.     Tenderness: There  is no abdominal tenderness.  Musculoskeletal:        General: No swelling. Normal range of motion.     Cervical back: Normal range of motion.     Right lower leg: No edema.     Left lower leg: No edema.  Lymphadenopathy:     Cervical: No cervical adenopathy.  Skin:    General: Skin is warm and dry.     Capillary Refill: Capillary refill takes less than 2 seconds.     Coloration: Skin is not jaundiced.  Neurological:     General: No focal deficit present.     Mental Status: She is alert and oriented to person, place, and time.  Psychiatric:        Mood and Affect: Mood normal.        Behavior: Behavior normal.       Assessment & Plan:   Problem List Items Addressed This  Visit       Anxiety   She has been seen on multiple occasions recently in the setting of poorly controlled anxiety.  She has previously tried fluoxetine  and sertraline  but experienced adverse side effects.  She is currently managing anxiety with as needed use of lorazepam  and hydroxyzine .  Recently evaluated in the ER in the setting of chest pain as well as by orthopedic surgery in the setting of chronic neck pain with dysesthesias.  Her symptoms have been largely attributed to somatic manifestations of anxiety.  Additional workup is otherwise documented.  We discussed additional options for management of anxiety today.  She has not been able to establish care with psychiatry.  Through shared decision making, will prescribe BuSpar 5 mg twice daily.  Okay to increase in increments of 5 mg every 2-3 days.  Continue as needed use of hydroxyzine  and lorazepam .  PDMP reviewed and is appropriate.  She will return to care for previously scheduled follow-up on 6/3.      Dysesthesia affecting both sides of body - Primary   Returning to care today for an acute visit in the setting of generalized, intermittent dysesthesias.  Recent ER presentation 5/23 endorsing chest pain.  Fortunately, her cardiac workup was reassuring, though I agree with referral to cardiology for further evaluation given unclear etiology of symptoms.  As noted above, she has recently been evaluated by orthopedic surgery in the setting of chronic neck pain.  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  She has been seen on multiple occasions recently for anxiety, which she still feels is uncontrolled.  She states that she does not feel like herself.  Neurologic exam today is grossly normal.  We reviewed additional treatment/workup items.  Through shared decision making, MRI brain ordered today to exclude intracranial pathology as the  underlying cause of her recent symptoms.  She is currently scheduled for follow-up next week (6/3).  Addressing anxiety is otherwise documented.      Meds ordered this encounter  Medications   busPIRone (BUSPAR) 5 MG tablet    Sig: Take 1 tablet (5 mg total) by mouth 2 (two) times daily.    Dispense:  60 tablet    Refill:  2    Return in about 6 days (around 06/05/2023).  Tobi Fortes, MD

## 2023-05-30 NOTE — Assessment & Plan Note (Signed)
 She has been seen on multiple occasions recently in the setting of poorly controlled anxiety.  She has previously tried fluoxetine  and sertraline  but experienced adverse side effects.  She is currently managing anxiety with as needed use of lorazepam  and hydroxyzine .  Recently evaluated in the ER in the setting of chest pain as well as by orthopedic surgery in the setting of chronic neck pain with dysesthesias.  Her symptoms have been largely attributed to somatic manifestations of anxiety.  Additional workup is otherwise documented.  We discussed additional options for management of anxiety today.  She has not been able to establish care with psychiatry.  Through shared decision making, will prescribe BuSpar 5 mg twice daily.  Okay to increase in increments of 5 mg every 2-3 days.  Continue as needed use of hydroxyzine  and lorazepam .  PDMP reviewed and is appropriate.  She will return to care for previously scheduled follow-up on 6/3.

## 2023-05-31 ENCOUNTER — Encounter (HOSPITAL_COMMUNITY): Payer: Self-pay

## 2023-05-31 ENCOUNTER — Emergency Department (HOSPITAL_COMMUNITY)
Admission: EM | Admit: 2023-05-31 | Discharge: 2023-05-31 | Disposition: A | Discharge status: Home / Self Care | Attending: Emergency Medicine | Admitting: Emergency Medicine

## 2023-05-31 ENCOUNTER — Other Ambulatory Visit: Payer: Self-pay

## 2023-05-31 DIAGNOSIS — E039 Hypothyroidism, unspecified: Secondary | ICD-10-CM | POA: Insufficient documentation

## 2023-05-31 DIAGNOSIS — F419 Anxiety disorder, unspecified: Secondary | ICD-10-CM | POA: Diagnosis not present

## 2023-05-31 DIAGNOSIS — R202 Paresthesia of skin: Secondary | ICD-10-CM | POA: Diagnosis present

## 2023-05-31 DIAGNOSIS — Z79899 Other long term (current) drug therapy: Secondary | ICD-10-CM | POA: Insufficient documentation

## 2023-05-31 MED ORDER — LORAZEPAM 0.5 MG PO TABS
0.5000 mg | ORAL_TABLET | Freq: Once | ORAL | Status: AC
  Administered 2023-05-31: 0.5 mg via ORAL
  Filled 2023-05-31: qty 1

## 2023-05-31 NOTE — ED Notes (Signed)
AVS provided to and discussed with patient and family member at bedside. Pt verbalizes understanding of discharge instructions and denies any questions or concerns at this time. Pt has ride home. Pt taken out of department via W/C.  ? ?

## 2023-05-31 NOTE — ED Provider Notes (Signed)
 [place] EMERGENCY DEPARTMENT AT [facility] Provider Note   CSN: [id number] Arrival date & time: 05/31/23  4742     History  Chief Complaint  Patient presents with   Anxiety    [name] is a 63 y.o. female.   Anxiety  Patient presents with various symptoms.  Has had now almost 6 months of symptoms.  Starting in January.  Has had some pain in her arms.  Rushes paresthesias down her body.  Symptoms abdominal hurt.  Has been seen various times in the ER has been seen by his PCP but seen by orthopedic surgery.  Had a cervical spine MRI that was reassuring besides some foraminal stenosis.  Has had injections.  Started on BuSpar yesterday by PCP.  Had dose last night but not yet this morning.  After 2 days of medicine can increase the morning dose if needed.  No weight loss.  Has been taking Atarax  and also Ativan  previously for this.    Past Medical History:  Diagnosis Date   Anxiety    Arthritis    Hypothyroidism     Home Medications Prior to Admission medications   Medication Sig Start Date End Date Taking? Authorizing Provider  busPIRone (BUSPAR) 5 MG tablet Take 1 tablet (5 mg total) by mouth 2 (two) times daily. 05/30/23   Tobi Fortes, MD  cetirizine  (ZYRTEC ) 10 MG tablet Take 1 tablet by mouth daily.    [provider]  FLUoxetine  (PROZAC ) 20 MG capsule Take 1 capsule by mouth daily.    [provider]  fluticasone  (FLONASE ) 50 MCG/ACT nasal spray Place 1 spray into both nostrils 2 (two) times daily.    [provider]  gabapentin  (NEURONTIN ) 300 MG capsule Take 1 capsule (300 mg total) by mouth 2 (two) times daily as needed.    [provider]  hydrOXYzine  (ATARAX ) 25 MG tablet Take 1 tablet (25 mg total) by mouth every 8 (eight) hours as needed. 05/25/23   Steinl, Kevin, MD  hydrOXYzine  (VISTARIL ) 25 MG capsule Take 1 capsule (25 mg total) by mouth every 8 (eight) hours as needed. 05/29/23   Tobi Fortes, MD   levothyroxine  (SYNTHROID ) 88 MCG tablet Take 1 tablet (88 mcg total) by mouth daily before breakfast. 10/26/22   Wendel Hals, NP  LORazepam  (ATIVAN ) 0.5 MG tablet Take 1 tablet (0.5 mg total) by mouth 2 (two) times daily as needed for anxiety. 05/10/23 06/09/23  Alison Irvine, FNP  Multiple Vitamin (MULTIVITAMIN) tablet Take 1 tablet by mouth daily.    [provider]  naproxen  (NAPROSYN ) 500 MG tablet Take 1 tablet by mouth 2 (two) times daily. 04/24/23   [provider]  ofloxacin  (FLOXIN ) 0.3 % OTIC solution INSTILL 5 DROPS TO LEFT EAR TWICE DAILY FOR 7 DAYS    [provider]  ondansetron  (ZOFRAN -ODT) 4 MG disintegrating tablet Take 1 tablet (4 mg total) by mouth every 8 (eight) hours as needed for nausea or vomiting. 05/30/23   Tobi Fortes, MD  predniSONE  (DELTASONE ) 10 MG tablet USE AS DIRECTED FOR SIX DAYS    [provider]  rosuvastatin  (CRESTOR ) 20 MG tablet TAKE ONE TABLET BY MOUTH ONCE DAILY. 05/25/23   Tobi Fortes, MD  sertraline  (ZOLOFT ) 25 MG tablet Take 1 tablet by mouth daily.    [provider]      Allergies    Patient has no known allergies.    Review of Systems   Review of  Systems  Physical Exam Updated Vital Signs BP 129/81   Pulse 69   Temp 98.1 F (36.7 C)   Resp 18   Ht 5\' 5"  (1.651 m)   Wt 67 kg   LMP 01/11/2013   SpO2 98%   BMI 24.56 kg/m  Physical Exam Vitals and nursing note reviewed.  HENT:     Head: Atraumatic.  Cardiovascular:     Rate and Rhythm: Normal rate.  Chest:     Chest wall: No tenderness.  Abdominal:     Tenderness: There is no abdominal tenderness.  Skin:    General: Skin is warm.  Neurological:     Mental Status: She is alert and oriented to person, place, and time.  Psychiatric:        Mood and Affect: Mood normal.     ED Results / Procedures / Treatments   Labs (all labs ordered are listed, but only abnormal results are displayed) Labs Reviewed - No data to  display  EKG None  Radiology No results found.  Procedures Procedures    Medications Ordered in ED Medications  LORazepam  (ATIVAN ) tablet 0.5 mg (0.5 mg Oral Given 05/31/23 0735)    ED Course/ Medical Decision Making/ A&P                                 Medical Decision Making Risk Prescription drug management.   Patient presents with paresthesias.  Has been going for around 6 months.  Come and go.  Differential diagnosis small but includes causes such as paresthesias, electrolyte abnormality.  Reviewed previous blood work and reassuring.  Reviewed report of MRI.  Has been treated as a somatic effect of her anxiety. Outpatient MRI of the brain has been ordered.  Do not think it needs to be done emergently however.  Will give dose of Ativan  here and can follow-up with PCP.  Does not appear to need further workup at this time.  Will discharge home.        Final Clinical Impression(s) / ED Diagnoses Final diagnoses:  Paresthesias    Rx / DC Orders ED Discharge Orders     None         Mozell Arias, MD 05/31/23 [phone]

## 2023-05-31 NOTE — ED Triage Notes (Addendum)
 Pov from home. Cc of anxiety. Gets odd sensation from face down to her arms and stomach. Went ot pcp yesterday they recommended outpatient mri. And started her on buspar. Took 1 dose and never felt any better yesterday evening. Couldn't sleep so she came in.

## 2023-06-04 ENCOUNTER — Ambulatory Visit: Payer: Self-pay | Admitting: Internal Medicine

## 2023-06-04 ENCOUNTER — Other Ambulatory Visit: Payer: Self-pay | Admitting: Internal Medicine

## 2023-06-04 ENCOUNTER — Ambulatory Visit: Payer: Self-pay

## 2023-06-04 DIAGNOSIS — F419 Anxiety disorder, unspecified: Secondary | ICD-10-CM

## 2023-06-04 MED ORDER — LORAZEPAM 0.5 MG PO TABS
0.5000 mg | ORAL_TABLET | Freq: Two times a day (BID) | ORAL | 0 refills | Status: DC | PRN
Start: 2023-06-04 — End: 2023-06-05

## 2023-06-04 MED ORDER — LORAZEPAM 0.5 MG PO TABS: 0.5000 mg | ORAL_TABLET | Freq: Two times a day (BID) | ORAL | 0 refills | Status: DC | PRN

## 2023-06-04 NOTE — Telephone Encounter (Signed)
 Patient is reporting she has taken the Buspar  and the Atarax . Unfortunately they are not as effective for her.   Patient was prescribed Lorazepam  prior and it worked well. Patient also notes she has an MRI scheduled for tomorrow, and relays she normally takes Lorazepam  prior to the MRI.   Patient is requesting a prescription for Lorazepam . Her preferred pharmacy is noted below.   Message routed to office appropriately.   Copied from CRM [id number]. Topic: Clinical - Medication Question >> Jun 04, 2023  8:41 AM Alpha Arts wrote: Reason for CRM: Patient states her current anxiety medication (busPIRone  (BUSPAR ) 5 MG tablet) is not working and she would like Lorazepam  instead.  Callback #: [phone]  Preferred Pharmacy: [facility] - [place], [place] - [address] [place] [address] Phone: [phone] Fax: [phone] Hours: Not open 24 hours

## 2023-06-05 ENCOUNTER — Ambulatory Visit: Payer: Self-pay

## 2023-06-05 ENCOUNTER — Telehealth: Payer: Self-pay

## 2023-06-05 ENCOUNTER — Ambulatory Visit (HOSPITAL_COMMUNITY)

## 2023-06-05 VITALS — BP 110/75 | HR 83 | Ht 64.0 in | Wt 146.0 lb

## 2023-06-05 DIAGNOSIS — F419 Anxiety disorder, unspecified: Secondary | ICD-10-CM | POA: Diagnosis not present

## 2023-06-05 DIAGNOSIS — R202 Paresthesia of skin: Secondary | ICD-10-CM

## 2023-06-05 DIAGNOSIS — R208 Other disturbances of skin sensation: Secondary | ICD-10-CM

## 2023-06-05 NOTE — Progress Notes (Unsigned)
 Established Patient Office Visit  Subjective   Patient ID: [name], female    DOB: Sep 12, 1960  Age: 63 y.o. MRN: [id number]  Chief Complaint  Patient presents with   Hospitalization Follow-up    Pt here for hospital follow-up was discharged from [facility] 05/31/23   Pt was seen at [facility] on 05/31/23: Here today for f/u of this visit.   She has been seen on multiple occasions recently in the setting of poorly controlled anxiety. She has previously tried fluoxetine  and sertraline  but experienced adverse side effects. She is currently managing anxiety with as needed use of lorazepam  and hydroxyzine . Recently evaluated in the ER in the setting of chest pain as well as by orthopedic surgery in the setting of chronic neck pain with dysesthesias. Her symptoms have been largely attributed to somatic manifestations of anxiety.  She has an MRI of her brain scheduled for later today.   HPI  Patient Active Problem List   Diagnosis Date Noted   Dysesthesia affecting both sides of body 05/30/2023   Spondylosis of cervical region without myelopathy or radiculopathy 04/24/2023   Strain of cervical portion of left trapezius muscle 02/14/2023   Anxiety 02/14/2023   Adult hypothyroidism 02/07/2023   Benign essential HTN 02/07/2023   HLD (hyperlipidemia) 02/07/2023   Goiter, nontoxic, 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   Neck pain 02/02/2023   Cervical pain (neck) 01/31/2023   Tingling in extremities 01/31/2023   Encounter for examination following treatment at hospital 01/31/2023   Tobacco use 10/11/2021   Encounter to establish care 10/11/2021   Prediabetes 03/31/2019   Hyperlipidemia 09/23/2015   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   Special screening for malignant neoplasms, colon    S/P thyroidectomy 08/15/2013   Past Medical History:  Diagnosis Date   Anxiety    Arthritis    Hypothyroidism       ROS    Objective:     BP 110/75   Pulse  83   Ht 5\' 4"  (1.626 m)   Wt 146 lb 0.6 oz (66.2 kg)   LMP 01/11/2013   SpO2 95%   BMI 25.07 kg/m  BP Readings from Last 3 Encounters:  06/05/23 110/75  05/31/23 129/81  05/30/23 110/75   Wt Readings from Last 3 Encounters:  06/05/23 146 lb 0.6 oz (66.2 kg)  05/31/23 147 lb 9.6 oz (67 kg)  05/30/23 147 lb 9.6 oz (67 kg)     Physical Exam Vitals and nursing note reviewed.  Constitutional:      Appearance: Normal appearance.  Eyes:     Extraocular Movements: Extraocular movements intact.     Pupils: Pupils are equal, round, and reactive to light.  Cardiovascular:     Rate and Rhythm: Normal rate and regular rhythm.  Pulmonary:     Effort: Pulmonary effort is normal.     Breath sounds: Normal breath sounds.  Neurological:     Mental Status: She is alert and oriented to person, place, and time.     Cranial Nerves: Cranial nerves 2-12 are intact.     Gait: Gait is intact.  Psychiatric:        Attention and Perception: Attention normal.        Mood and Affect: Mood is anxious. Affect is tearful.        Speech: Speech normal.        Behavior: Behavior normal.        Thought Content: Thought content normal.  No results found for any visits on 06/05/23.  Last CBC Lab Results  Component Value Date   WBC 7.4 05/25/2023   HGB 15.4 (H) 05/25/2023   HCT 46.0 05/25/2023   MCV 89.7 05/25/2023   MCH 30.0 05/25/2023   RDW 12.3 05/25/2023   PLT 298 05/25/2023   Last metabolic panel Lab Results  Component Value Date   GLUCOSE 109 (H) 05/25/2023   NA 136 05/25/2023   K 4.2 05/25/2023   CL 103 05/25/2023   CO2 26 05/25/2023   BUN 12 05/25/2023   CREATININE 0.56 05/25/2023   GFRNONAA >60 05/25/2023   CALCIUM  9.2 05/25/2023   PROT 7.1 05/25/2023   ALBUMIN 4.1 05/25/2023   LABGLOB 2.2 10/06/2022   AGRATIO 2.1 10/11/2021   BILITOT 0.5 05/25/2023   ALKPHOS 46 05/25/2023   AST 18 05/25/2023   ALT 26 05/25/2023   ANIONGAP 7 05/25/2023   Last lipids Lab Results   Component Value Date   CHOL 158 01/26/2023   HDL 44 01/26/2023   LDLCALC 73 01/26/2023   TRIG 255 (H) 01/26/2023   CHOLHDL 3.6 01/26/2023   Last hemoglobin A1c Lab Results  Component Value Date   HGBA1C 5.9 (H) 10/06/2022   Last thyroid  functions Lab Results  Component Value Date   TSH 3.441 05/08/2023   Last vitamin B12 and Folate Lab Results  Component Value Date   FOLATE >20.0 02/07/2023      The 10-year ASCVD risk score (Arnett DK, et al., 2019) is: 2.4%    Assessment & Plan:   Problem List Items Addressed This Visit       Other   Anxiety - Primary   Ongoing.  She was started on BuSpar  last week by Dr. Kermit Ped and thinks she might feel some improvement in her anxiety since starting this, although she does report another "episode" last night.  She has also been referred to psychiatry for further evaluation and treatment.  Recommend follow-up in 4 to 6 weeks to evaluate how she is doing.      Dysesthesia affecting both sides of body   Most recent ER visit unrevealing for cause of symptoms.  She has an MRI scheduled for later today.  Follow-up according to results or sooner if symptoms are worsening.       No follow-ups on file.    Alison Irvine, FNP

## 2023-06-05 NOTE — Telephone Encounter (Signed)
 Working on it, Pt states DR.Dixon put in a Order for MRI and she went to appointment today at 2;45 to have it done and they told her Insurance has not approved it yet.

## 2023-06-05 NOTE — Telephone Encounter (Signed)
 Patient here upset says her prior [employer] Dress is not done and her appt was today- she is upset wanting to speak with someone asap. Please advise Thank you

## 2023-06-06 ENCOUNTER — Telehealth: Payer: Self-pay

## 2023-06-06 ENCOUNTER — Ambulatory Visit (HOSPITAL_COMMUNITY)

## 2023-06-06 NOTE — Telephone Encounter (Signed)
 Pt advised Via my chart message

## 2023-06-06 NOTE — Telephone Encounter (Signed)
 As I also explained to [name] - Patient scheduled her own Appt before Auth was complete. I have sent PA to [employer] and at this time the order is still under review :)  Once a decision from [employer] has been made I will reach out to Patient!

## 2023-06-06 NOTE — Telephone Encounter (Signed)
 Per Genene Kennel- Pt scheduled her own appt without Auth being completed at this time, She has placed a PA on file with her insurance but it went to clinical review at this time

## 2023-06-06 NOTE — Telephone Encounter (Signed)
 Pt advised with verbal understanding

## 2023-06-06 NOTE — Telephone Encounter (Signed)
 Please advise  Patient went for MRI on 6/3   Copied from CRM [id number]. Topic: General - Other >> Jun 06, 2023  9:36 AM Alysia Jumbo S wrote: Reason for CRM: Patient states that she went to have her MRI completed on 05/05/23, however when she arrived she was informed that her insurance would not cover the MRI. Patient requesting callback at [phone].

## 2023-06-07 NOTE — Assessment & Plan Note (Signed)
 Ongoing.  She was started on BuSpar  last week by Dr. Kermit Ped and thinks she might feel some improvement in her anxiety since starting this, although she does report another "episode" last night.  She has also been referred to psychiatry for further evaluation and treatment.  Recommend follow-up in 4 to 6 weeks to evaluate how she is doing.

## 2023-06-07 NOTE — Assessment & Plan Note (Signed)
 Most recent ER visit unrevealing for cause of symptoms.  She has an MRI scheduled for later today.  Follow-up according to results or sooner if symptoms are worsening.

## 2023-06-08 ENCOUNTER — Other Ambulatory Visit: Payer: Self-pay | Admitting: Internal Medicine

## 2023-06-08 DIAGNOSIS — F419 Anxiety disorder, unspecified: Secondary | ICD-10-CM

## 2023-06-08 NOTE — Telephone Encounter (Signed)
 Medication was prescribed 5/28, pharmacy requests a new prescription w/ updated instructions.

## 2023-06-08 NOTE — Telephone Encounter (Signed)
 Copied from CRM [id number]. Topic: Clinical - Medication Refill >> Jun 08, 2023  9:27 AM Allyne Areola wrote: Medication: busPIRone  (BUSPAR ) 5 MG tablet [[id number]]  Has the patient contacted their pharmacy? Yes, pharmacy is requesting a new prescription with updated instructions (Agent: If no, request that the patient contact the pharmacy for the refill. If patient does not wish to contact the pharmacy document the reason why and proceed with request.) (Agent: If yes, when and what did the pharmacy advise?)  This is the patient's preferred pharmacy:  [facility] - [place], [place] - [address] [place] [address] Phone: [phone] Fax: [phone]    Is this the correct pharmacy for this prescription? Yes If no, delete pharmacy and type the correct one.   Has the prescription been filled recently? No  Is the patient out of the medication? Yes  Has the patient been seen for an appointment in the last year OR does the patient have an upcoming appointment? Yes  Can we respond through MyChart? Yes  Agent: Please be advised that Rx refills may take up to 3 business days. We ask that you follow-up with your pharmacy.

## 2023-06-11 ENCOUNTER — Other Ambulatory Visit: Payer: Self-pay

## 2023-06-11 ENCOUNTER — Ambulatory Visit: Payer: Self-pay | Admitting: *Deleted

## 2023-06-11 DIAGNOSIS — F419 Anxiety disorder, unspecified: Secondary | ICD-10-CM

## 2023-06-11 MED ORDER — BUSPIRONE HCL 7.5 MG PO TABS: 7.5000 mg | ORAL_TABLET | Freq: Three times a day (TID) | ORAL | 2 refills | Status: DC

## 2023-06-11 NOTE — Telephone Encounter (Signed)
 Reason for Disposition . [1] Caller has URGENT medicine question about med that PCP or specialist prescribed AND [2] triager unable to answer question  Answer Assessment - Initial Assessment Questions 1. NAME of MEDICINE: "What medicine(s) are you calling about?"     Buspar  5 mg  2. QUESTION: "What is your question?" (e.g., double dose of medicine, side effect)     Does Dr. Kermit Ped recommend patient to increase dose from taking 2 tablets in am 1 in pm to 3 tablets in am and 2 in pm? 3. PRESCRIBER: "Who prescribed the medicine?" Reason: if prescribed by specialist, call should be referred to that group.     Dr. Kermit Ped 4. SYMPTOMS: "Do you have any symptoms?" If Yes, ask: "What symptoms are you having?"  "How bad are the symptoms (e.g., mild, moderate, severe)     Sensation is better but anxiety still noted and not gone away. 5. PREGNANCY:  "Is there any chance that you are pregnant?" "When was your last menstrual period?"     Na   Patient reports she needs refill or Dr. Kermit Ped to write new prescription for increased amount of medication, buspar  and how much she needs to be taking. Patient is going out of town tomorrow and needs Rx today . Patient requesting a call back when completed. See previous OV 06/05/23.  Protocols used: Medication Question Call-A-AH

## 2023-06-11 NOTE — Telephone Encounter (Signed)
 FYI Only or Action Required?: Action required by provider  Patient was last seen in primary care on 06/05/2023 by Alison Irvine, FNP. Called Nurse Triage reporting Medication Problem. Symptoms began several days ago. Interventions attempted: Prescription medications: buspar . Symptoms are: unchanged.  Triage Disposition: Call PCP Now  Patient/caregiver understands and will follow disposition?: yes , please advise today

## 2023-06-19 ENCOUNTER — Ambulatory Visit: Payer: Self-pay | Admitting: Nurse Practitioner

## 2023-06-19 ENCOUNTER — Ambulatory Visit
Admission: EM | Admit: 2023-06-19 | Discharge: 2023-06-19 | Disposition: A | Discharge status: Home / Self Care | Attending: Nurse Practitioner | Admitting: Nurse Practitioner

## 2023-06-19 ENCOUNTER — Ambulatory Visit (INDEPENDENT_AMBULATORY_CARE_PROVIDER_SITE_OTHER)

## 2023-06-19 ENCOUNTER — Ambulatory Visit: Payer: Self-pay

## 2023-06-19 DIAGNOSIS — M25472 Effusion, left ankle: Secondary | ICD-10-CM

## 2023-06-19 NOTE — ED Triage Notes (Signed)
 Pt presents to UC for c/o medial left ankle pain, burning sensation, tingling, and swelling x 2 months. Pt states it sometimes occurs in right ankle as well.  Hx left ankle fracture Takes tylenol  and uses ice packs

## 2023-06-19 NOTE — Telephone Encounter (Signed)
 FYI Only or Action Required?: Action required by provider  Patient was last seen in primary care on 06/05/2023 by Alison Irvine, FNP. Called Nurse Triage reporting Ankle Pain. Symptoms began about a month ago. Interventions attempted: OTC medications: tylenol . Symptoms are: unchanged.  Triage Disposition: See PCP Within 2 Weeks  Patient/caregiver understands and will follow disposition?:Yes                                     Copied from CRM [id number]. Topic: Clinical - Red Word Triage >> Jun 19, 2023  3:43 PM Tiffini S wrote: Red Word that prompted transfer to Nurse Triage: Patient was seen at urgent care today is and have pain with left foot heel being swollen. Transferred call to triage nurse. Reason for Disposition  [1] MILD pain (e.g., does not interfere with normal activities) AND [2] present > 7 days  Answer Assessment - Initial Assessment Questions 1. ONSET: When did the pain start?      Off and on since may  2. LOCATION: Where is the pain located?      Left foot 3. PAIN: How bad is the pain?    (Scale 1-10; or mild, moderate, severe)  - MILD (1-3): doesn't interfere with normal activities.   - MODERATE (4-7): interferes with normal activities (e.g., work or school) or awakens from sleep, limping.   - SEVERE (8-10): excruciating pain, unable to do any normal activities, unable to walk.      4/10 4. WORK OR EXERCISE: Has there been any recent work or exercise that involved this part of the body?      no 5. CAUSE: What do you think is causing the ankle pain?     arthritis 6. OTHER SYMPTOMS: Do you have any other symptoms? (e.g., calf pain, rash, fever, swelling)     Swelling   UC gave her a brace and she has appt for next week but wanting something for pain.  Protocols used: Ankle Pain-A-AH

## 2023-06-19 NOTE — Discharge Instructions (Addendum)
 I will contact you later today with the results of the ankle xray.  As we discussed, I suspect there is some arthritis in your ankle from the old ankle fracture.  Recommend wearing compression stockings or the Ace wrap whenever this flares up.  You can continue ice, elevation, and Tylenol  as needed for pain.  Recommend follow-up with your primary care provider if your symptoms persist or worsen despite treatment.

## 2023-06-19 NOTE — ED Provider Notes (Addendum)
 [facility]    CSN: [id number] Arrival date & time: 06/19/23  1005      History   Chief Complaint No chief complaint on file.   HPI [name] is a 63 y.o. female.   Patient presents today with 56-month history of left ankle swelling, burning, and tingling.  She was seen for same in ER on 05/28/2023 and reports is not better.  Reports she takes Tylenol , elevates her ankle, and applies ice to it which does seem to help.  She reports history of ankle fracture, however denies any ankle surgeries.  No numbness or tingling in the toes.  Reports occasionally, she has right ankle pain as well.  No recent fall, trauma, or injury to the left ankle.  Patient is currently following closely with her primary care provider for anxiety.  She mentioned the ankle to her primary care provider and she reports they told her it was a pressure point.    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   Neck pain 02/02/2023   Cervical pain (neck) 01/31/2023   Tingling in extremities 01/31/2023   Encounter for examination following treatment at hospital 01/31/2023   Tobacco use 10/11/2021   Encounter to establish care 10/11/2021   Prediabetes 03/31/2019   Hyperlipidemia 09/23/2015   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   Special screening for malignant neoplasms, colon    S/P thyroidectomy 08/15/2013    Past Surgical History:  Procedure Laterality Date   APPENDECTOMY     Bilateral  bunionectomy     COLONOSCOPY N/A 01/01/2015   Procedure: COLONOSCOPY;  Surgeon: Alyce Jubilee, MD;  Location: [facility];  Service: Endoscopy;  Laterality: N/A;  11:15 AM - pt knows to arrive at 11:15   LID LESION EXCISION Right 12/29/2020   Procedure: LID LESION EXCISION;  Surgeon: Lorena Rolling, MD;  Location: [facility];  Service: Ophthalmology;  Laterality: Right;  LOCAL   THYROIDECTOMY N/A 08/15/2013   Procedure: TOTAL THYROIDECTOMY;  Surgeon: Beau Bound, MD;  Location: [facility];  Service: General;  Laterality: N/A;    OB History   No obstetric history on file.      Home Medications    Prior to Admission medications   Medication Sig Start Date End Date Taking? Authorizing Provider  busPIRone  (BUSPAR ) 7.5 MG tablet Take 1 tablet (7.5 mg total) by mouth 3 (three) times daily. 06/11/23  Yes Alison Irvine, FNP  levothyroxine  (SYNTHROID ) 88 MCG tablet Take 1 tablet (88 mcg total) by mouth daily before breakfast. 10/26/22  Yes Wendel Hals, NP  Multiple Vitamin (MULTIVITAMIN) tablet Take 1 tablet by mouth daily.   Yes [provider]  ondansetron  (ZOFRAN -ODT) 4 MG disintegrating tablet Take 1 tablet (4 mg total) by mouth every 8 (eight) hours as needed for nausea or vomiting. 05/30/23  Yes Tobi Fortes, MD  rosuvastatin  (CRESTOR ) 20 MG tablet TAKE ONE TABLET BY MOUTH ONCE DAILY. 05/25/23  Yes Tobi Fortes, MD  hydrOXYzine  (ATARAX )  25 MG tablet Take 1 tablet (25 mg total) by mouth every 8 (eight) hours as needed. 05/25/23   Steinl, Kevin, MD  hydrOXYzine  (VISTARIL ) 25 MG capsule Take 1 capsule (25 mg total) by mouth every 8 (eight) hours as needed. 05/29/23   Tobi Fortes, MD    Family History History reviewed. No pertinent family history.  Social History Social History   Tobacco Use   Smoking status: Every Day    Current packs/day: 0.10    Average packs/day: 0.1 packs/day for 15.0 years (1.5 ttl pk-yrs)    Types: Cigarettes    Passive  exposure: Past   Smokeless tobacco: Never  Vaping Use   Vaping status: Never Used  Substance Use Topics   Alcohol use: No   Drug use: No     Allergies   Patient has no known allergies.   Review of Systems Review of Systems Per HPI  Physical Exam Triage Vital Signs ED Triage Vitals  Encounter Vitals Group     BP 06/19/23 1015 110/74     Girls Systolic BP Percentile --      Girls Diastolic BP Percentile --      Boys Systolic BP Percentile --      Boys Diastolic BP Percentile --      Pulse Rate 06/19/23 1015 82     Resp 06/19/23 1015 16     Temp 06/19/23 1015 98.6 F (37 C)     Temp Source 06/19/23 1015 Oral     SpO2 06/19/23 1015 94 %     Weight --      Height --      Head Circumference --      Peak Flow --      Pain Score 06/19/23 1012 2     Pain Loc --      Pain Education --      Exclude from Growth Chart --    No data found.  Updated Vital Signs BP 110/74 (BP Location: Right Arm)   Pulse 82   Temp 98.6 F (37 C) (Oral)   Resp 16   LMP 01/11/2013   SpO2 94%   Visual Acuity Right Eye Distance:   Left Eye Distance:   Bilateral Distance:    Right Eye Near:   Left Eye Near:    Bilateral Near:     Physical Exam Vitals and nursing note reviewed.  Constitutional:      General: She is not in acute distress.    Appearance: Normal appearance. She is not toxic-appearing.  HENT:     Mouth/Throat:     Mouth: Mucous membranes are moist.     Pharynx: Oropharynx is clear.  Pulmonary:     Effort: Pulmonary effort is normal. No respiratory distress.   Musculoskeletal:     Comments: Inspection: Mild swelling surrounding left medial malleolus; no bruising, obvious deformity or redness Palpation: tender to palpation left medial malleolus diffusely and surrounding structures; no obvious deformities palpated ROM: Full ROM to left ankle and flexibility of left foot Strength: 5/5 bilateral lower extremities Neurovascular: neurovascularly intact in distal  bilateral lower extremities   Skin:    General: Skin is warm and dry.     Capillary Refill: Capillary refill takes less than 2 seconds.     Coloration: Skin is not jaundiced or pale.     Findings: No erythema.   Neurological:     Mental Status: She is alert and oriented to person, place, and time.   Psychiatric:  Behavior: Behavior is cooperative.      UC Treatments / Results  Labs (all labs ordered are listed, but only abnormal results are displayed) Labs Reviewed - No data to display  EKG   Radiology DG Ankle Complete Left Result Date: 06/19/2023 CLINICAL DATA:  Left ankle pain for 2 months. History of medial ankle pain. EXAM: LEFT ANKLE COMPLETE - 3+ VIEW COMPARISON:  None Available. FINDINGS: The ankle mortise is symmetric and intact. Mild to moderate plantar calcaneal heel spur. Moderate to high-grade dorsal osteophytosis, joint space narrowing, subchondral sclerosis, and vacuum phenomenon at the tarsometatarsal joints on lateral view, likely of the second and/or third tarsometatarsal joints. There is a circles able in the region of the first tarsometatarsal joint. A screw overlies the lateral metatarsals. Moderate navicular-cuneiform dorsal osteophytosis on oblique view. No acute fracture is seen.  No dislocation. IMPRESSION: 1. Moderate to high-grade dorsal osteophytosis, joint space narrowing, subchondral sclerosis, and vacuum phenomenon at the tarsometatarsal joints on lateral view, likely of the second and/or third tarsometatarsal joints. 2. Mild to moderate plantar calcaneal heel spur. Electronically Signed   By: Bertina Broccoli M.D.   On: 06/19/2023 12:59    Procedures Procedures (including critical care time)  Medications Ordered in UC Medications - No data to display  Initial Impression / Assessment and Plan / UC Course  I have reviewed the triage vital signs and the nursing notes.  Pertinent labs & imaging results that were available during my care of the  patient were reviewed by me and considered in my medical decision making (see chart for details).   Patient is well-appearing, normotensive, afebrile, not tachycardic, not tachypneic, oxygenating well on room air.   1. Left ankle swelling No red flags Patient requests x-ray today which was performed per her request Per my independent review, there does appear to be degenerative changes of the medial left ankle Will notify patient with results of x-ray Suspect possible arthritis versus chronic degenerative changes related to fracture Recommended continued ice, elevation, Tylenol , compression as needed for pain and follow-up with primary care provider if not improving  Update: X-ray imaging officially read as dorsal osteophytosis, joint space narrowing, subchondral sclerosis.  Likely consistent with osteoarthritis.  I attempted to call the patient and left a voicemail to return call to urgent care and then notified the patient of the results via MyChart.  No change to treatment plan from today.  Recommended follow-up with primary care provider and/or orthopedic provider as needed.  The patient was given the opportunity to ask questions.  All questions answered to their satisfaction.  The patient is in agreement to this plan.   Final Clinical Impressions(s) / UC Diagnoses   Final diagnoses:  Left ankle swelling     Discharge Instructions      I will contact you later today with the results of the ankle xray.  As we discussed, I suspect there is some arthritis in your ankle from the old ankle fracture.  Recommend wearing compression stockings or the Ace wrap whenever this flares up.  You can continue ice, elevation, and Tylenol  as needed for pain.  Recommend follow-up with your primary care provider if your symptoms persist or worsen despite treatment.    ED Prescriptions   None    PDMP not reviewed this encounter.   Wilhemena Harbour, NP 06/19/23 1125    Wilhemena Harbour,  NP 06/19/23 1311

## 2023-06-26 ENCOUNTER — Telehealth: Payer: Self-pay

## 2023-06-26 NOTE — Telephone Encounter (Signed)
 Copied from CRM [id number]. Topic: General - Other >> Jun 26, 2023  2:37 PM Santiya F wrote: Reason for CRM: Katrina with [facility] is calling in because she wanted to leave a non urgent message for patient's provider. She states that patient has had quite a few ER visits and she believes a follow up needs to be scheduled with the patient to discuss redirection of care that is not the ER.

## 2023-06-26 NOTE — Telephone Encounter (Signed)
 Pt's last several ed/uc visits pertained to anxiety which she has seen and is continuing seeing Leita for since then.

## 2023-06-29 ENCOUNTER — Telehealth: Payer: Self-pay

## 2023-06-29 ENCOUNTER — Ambulatory Visit

## 2023-06-29 VITALS — BP 105/70 | HR 77 | Ht 64.0 in | Wt 145.0 lb

## 2023-06-29 DIAGNOSIS — F419 Anxiety disorder, unspecified: Secondary | ICD-10-CM | POA: Diagnosis not present

## 2023-06-29 DIAGNOSIS — M25572 Pain in left ankle and joints of left foot: Secondary | ICD-10-CM | POA: Diagnosis not present

## 2023-06-29 MED ORDER — ESCITALOPRAM OXALATE 10 MG PO TABS
10.0000 mg | ORAL_TABLET | Freq: Every day | ORAL | 3 refills | Status: DC
Start: 2023-06-29 — End: 2023-07-12

## 2023-06-29 NOTE — Telephone Encounter (Signed)
 The pharmacy states its the Fluoxetine  (Prozac ) she can't been on that with the lexapro.

## 2023-06-29 NOTE — Progress Notes (Unsigned)
 Established Patient Office Visit  Subjective   Patient ID: [name], female    DOB: 09-20-1960  Age: 63 y.o. MRN: [id number]  Chief Complaint  Patient presents with   Medical Management of Chronic Issues    Pt here for UC follow up, done xray said it was arthritis in her ankle and wouldn't perscrib her anything because of medications she's on    HPI  Patient presents today with 28-month history of left ankle swelling, burning, and tingling. She was seen for same in ER on 05/28/2023 and reports is not better. Reports she takes Tylenol , elevates her ankle, and applies ice to it which does seem to help. She reports history of ankle fracture, however denies any ankle surgeries. No numbness or tingling in the toes. Reports occasionally, she has right ankle pain as well. No recent fall, trauma, or injury to the left ankle.   Patient Active Problem List   Diagnosis Date Noted   Dysesthesia affecting both sides of body 05/30/2023   Spondylosis of cervical region without myelopathy or radiculopathy 04/24/2023   Strain of cervical portion of left trapezius muscle 02/14/2023   Anxiety 02/14/2023   Adult hypothyroidism 02/07/2023   Benign essential HTN 02/07/2023   HLD (hyperlipidemia) 02/07/2023   Goiter, nontoxic, 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   Neck pain 02/02/2023   Cervical pain (neck) 01/31/2023   Tingling in extremities 01/31/2023   Encounter for examination following treatment at hospital 01/31/2023   Tobacco use 10/11/2021   Encounter to establish care 10/11/2021   Prediabetes 03/31/2019   Hyperlipidemia 09/23/2015   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  Constitutional:      Appearance: Normal appearance.   Eyes:     Extraocular Movements: Extraocular movements intact.     Pupils: Pupils are equal, round, and reactive to light.    Cardiovascular:     Rate and Rhythm: Normal rate and regular rhythm.  Pulmonary:     Effort: Pulmonary effort is normal.     Breath sounds: Normal breath sounds.   Musculoskeletal:     Cervical back: Normal range of motion and neck supple.     Right ankle: Normal.     Left ankle: No swelling. Tenderness present over the lateral malleolus. Normal range of motion.   Neurological:     Mental Status: She is alert and oriented to person, place, and time.   Psychiatric:        Mood and Affect: Mood is anxious. Mood is not depressed.        Speech: Speech is rapid and pressured.        Behavior: Behavior normal. Behavior is cooperative.        Thought Content: Thought content includes suicidal ideation. Thought content includes suicidal plan.  Cognition and Memory: Cognition normal.      No results found for any visits on 06/29/23.    The 10-year ASCVD risk score (Arnett DK, et al., 2019) is: 5.9%    Assessment & Plan:   Problem List Items Addressed This Visit       Other   Anxiety - Primary   Ongoing.   -Will add Lexapro for ongoing anxiety.  Discussed that it may take 4 to 6 weeks for the Lexapro to take full effect, therefore she can still use the BuSpar  2-3 times a day as needed for breakthrough anxiety.  -She has also been referred to psychiatry for further evaluation and treatment.  -Recommend follow-up in 4 to 6 weeks to evaluate how she is doing.      Relevant Medications   escitalopram (LEXAPRO) 10 MG tablet   Other Visit Diagnoses       Acute left  ankle pain       Recent imaging studies from urgent care reviewed.  Patient reports some improvement with initial symptoms.  Likely muscle strain.  Recommend RICE       Return in about 2 months (around 08/29/2023) for for recheck of symptoms.    Leita Longs, FNP

## 2023-06-29 NOTE — Telephone Encounter (Signed)
 Copied from CRM [id number]. Topic: Clinical - Prescription Issue >> Jun 29, 2023  9:20 AM Vena H wrote: Reason for CRM: [facility] called wanting clarification on the prescription that was sent over to them for Lexapro. They states that the pt is already on a long acting SSRI and being on both of them can be harmful to the pt.

## 2023-06-29 NOTE — Telephone Encounter (Signed)
 Noted spoke to pharmacy [place] acutally went to pharmacy and told them she was not taking the Prozac  anymore so she has her RX now

## 2023-07-02 ENCOUNTER — Other Ambulatory Visit: Payer: Self-pay

## 2023-07-02 ENCOUNTER — Encounter (HOSPITAL_COMMUNITY): Payer: Self-pay

## 2023-07-02 ENCOUNTER — Emergency Department (HOSPITAL_COMMUNITY)

## 2023-07-02 ENCOUNTER — Emergency Department (HOSPITAL_COMMUNITY)
Admission: EM | Admit: 2023-07-02 | Discharge: 2023-07-02 | Disposition: A | Discharge status: Home / Self Care | Attending: Emergency Medicine | Admitting: Emergency Medicine

## 2023-07-02 ENCOUNTER — Telehealth: Payer: Self-pay

## 2023-07-02 DIAGNOSIS — I10 Essential (primary) hypertension: Secondary | ICD-10-CM | POA: Insufficient documentation

## 2023-07-02 DIAGNOSIS — K5710 Diverticulosis of small intestine without perforation or abscess without bleeding: Secondary | ICD-10-CM | POA: Diagnosis not present

## 2023-07-02 DIAGNOSIS — N289 Disorder of kidney and ureter, unspecified: Secondary | ICD-10-CM | POA: Diagnosis not present

## 2023-07-02 DIAGNOSIS — K7689 Other specified diseases of liver: Secondary | ICD-10-CM | POA: Diagnosis not present

## 2023-07-02 DIAGNOSIS — F419 Anxiety disorder, unspecified: Secondary | ICD-10-CM | POA: Diagnosis not present

## 2023-07-02 DIAGNOSIS — R109 Unspecified abdominal pain: Secondary | ICD-10-CM | POA: Diagnosis not present

## 2023-07-02 DIAGNOSIS — Z79899 Other long term (current) drug therapy: Secondary | ICD-10-CM | POA: Diagnosis not present

## 2023-07-02 DIAGNOSIS — K5900 Constipation, unspecified: Secondary | ICD-10-CM | POA: Diagnosis not present

## 2023-07-02 LAB — CBG MONITORING, ED: Glucose-Capillary: 120 mg/dL — ABNORMAL HIGH (ref 70–99)

## 2023-07-02 LAB — COMPREHENSIVE METABOLIC PANEL WITH GFR
ALT: 23 U/L (ref 0–44)
AST: 20 U/L (ref 15–41)
Albumin: 4.2 g/dL (ref 3.5–5.0)
Alkaline Phosphatase: 51 U/L (ref 38–126)
Anion gap: 10 (ref 5–15)
BUN: 13 mg/dL (ref 6–20)
CO2: 26 mmol/L (ref 22–32)
Calcium: 9.4 mg/dL (ref 8.9–10.3)
Chloride: 104 mmol/L (ref 98–111)
Creatinine, Ser: 0.63 mg/dL (ref 0.44–1.00)
GFR, Estimated: >60 mL/min (ref >60)
Glucose, Bld: 106 mg/dL — ABNORMAL HIGH (ref 70–99)
Potassium: 5 mmol/L (ref 3.5–5.1)
Sodium: 140 mmol/L (ref 135–145)
Total Bilirubin: 0.4 mg/dL (ref 0.0–1.2)
Total Protein: 7.1 g/dL (ref 6.5–8.1)

## 2023-07-02 LAB — CBC WITH DIFFERENTIAL/PLATELET
Abs Immature Granulocytes: 0.05 10*3/uL (ref 0.00–0.07)
Basophils Absolute: 0.1 10*3/uL (ref 0.0–0.1)
Basophils Relative: 1 %
Eosinophils Absolute: 0.4 10*3/uL (ref 0.0–0.5)
Eosinophils Relative: 4 %
HCT: 46.1 % — ABNORMAL HIGH (ref 36.0–46.0)
Hemoglobin: 15.4 g/dL — ABNORMAL HIGH (ref 12.0–15.0)
Immature Granulocytes: 1 %
Lymphocytes Relative: 23 %
Lymphs Abs: 2.4 10*3/uL (ref 0.7–4.0)
MCH: 30.3 pg (ref 26.0–34.0)
MCHC: 33.4 g/dL (ref 30.0–36.0)
MCV: 90.6 fL (ref 80.0–100.0)
Monocytes Absolute: 0.9 10*3/uL (ref 0.1–1.0)
Monocytes Relative: 9 %
Neutro Abs: 6.7 10*3/uL (ref 1.7–7.7)
Neutrophils Relative %: 62 %
Platelets: 297 10*3/uL (ref 150–400)
RBC: 5.09 MIL/uL (ref 3.87–5.11)
RDW: 12.5 % (ref 11.5–15.5)
WBC: 10.5 10*3/uL (ref 4.0–10.5)
nRBC: 0 % (ref 0.0–0.2)

## 2023-07-02 LAB — TROPONIN I (HIGH SENSITIVITY)
Troponin I (High Sensitivity): <2 ng/L (ref <18)
Troponin I (High Sensitivity): <2 ng/L (ref <18)

## 2023-07-02 LAB — LIPASE, BLOOD: Lipase: 31 U/L (ref 11–51)

## 2023-07-02 LAB — MAGNESIUM: Magnesium: 2.4 mg/dL (ref 1.7–2.4)

## 2023-07-02 MED ORDER — ONDANSETRON HCL 4 MG/2ML IJ SOLN
4.0000 mg | Freq: Once | INTRAMUSCULAR | Status: AC
  Administered 2023-07-02: 4 mg via INTRAVENOUS
  Filled 2023-07-02: qty 2

## 2023-07-02 MED ORDER — BUSPIRONE HCL 15 MG PO TABS: 15.0000 mg | ORAL_TABLET | Freq: Three times a day (TID) | ORAL | 2 refills | Status: DC

## 2023-07-02 MED ORDER — IOHEXOL 300 MG/ML  SOLN
100.0000 mL | Freq: Once | INTRAMUSCULAR | Status: AC | PRN
Start: 2023-07-02 — End: 2023-07-02
  Administered 2023-07-02: 100 mL via INTRAVENOUS

## 2023-07-02 NOTE — Discharge Instructions (Signed)
 Test results today are reassuring.  Continue your current bowel regimen.  It would help to add a daily fiber supplement.  These are available over-the-counter.  It is typically a psyllium husk supplement such as Metamucil.    There is a telephone number below that you can call to establish care with a gastroenterologist.    Return to emergency department for any new or worsening symptoms of concern.

## 2023-07-02 NOTE — ED Provider Notes (Signed)
 [place] EMERGENCY DEPARTMENT AT [facility] Provider Note   CSN: [id number] Arrival date & time: 07/02/23  9179     Patient presents with: Constipation   [name] is a 63 y.o. female.    Constipation Associated symptoms: nausea   Patient presents for multiple complaints.  Medical history includes anxiety, arthritis, HTN, prediabetes, HLD, anxiety.  She typically has a bowel movement every 2 to 3 days.  At baseline, she does take a stool softener daily.  Over the past month, she has had worsened constipation.  She has increased her home bowel regimen.  She will often have nausea in the mornings.  This treated with ODT Zofran .  Due to worsened constipation, patient took 2 stool softeners last night.  This morning, she had MiraLAX  in her coffee.  She did subsequently have a large bowel movement.  After this, she had an episode which she describes as not feeling herself.  She had some left arm tingling.  She had some nausea.  She took a Zofran  and nausea resolved.  On the way to the hospital, she did have improved symptoms.  She currently denies any chest pain, abdominal pain.  She denies any recent urinary symptoms.     Prior to Admission medications   Medication Sig Start Date End Date Taking? Authorizing Provider  busPIRone  (BUSPAR ) 7.5 MG tablet Take 1 tablet (7.5 mg total) by mouth 3 (three) times daily. 06/11/23   [name], FNP  escitalopram (LEXAPRO) 10 MG tablet Take 1 tablet (10 mg total) by mouth at bedtime. 06/29/23   [name], FNP  hydrOXYzine  (ATARAX ) 25 MG tablet Take 1 tablet (25 mg total) by mouth every 8 (eight) hours as needed. 05/25/23   Steinl, Kevin, MD  hydrOXYzine  (VISTARIL ) 25 MG capsule Take 1 capsule (25 mg total) by mouth every 8 (eight) hours as needed. 05/29/23   Melvenia Manus BRAVO, MD  levothyroxine  (SYNTHROID ) 88 MCG tablet Take 1 tablet (88 mcg total) by mouth daily before breakfast. 10/26/22   Therisa Benton PARAS, NP  Multiple Vitamin  (MULTIVITAMIN) tablet Take 1 tablet by mouth daily.    [provider]  ondansetron  (ZOFRAN -ODT) 4 MG disintegrating tablet Take 1 tablet (4 mg total) by mouth every 8 (eight) hours as needed for nausea or vomiting. 05/30/23   Melvenia Manus BRAVO, MD  rosuvastatin  (CRESTOR ) 20 MG tablet TAKE ONE TABLET BY MOUTH ONCE DAILY. 05/25/23   Melvenia Manus BRAVO, MD    Allergies: Patient has no known allergies.    Review of Systems  Gastrointestinal:  Positive for constipation and nausea.  Psychiatric/Behavioral:  The patient is nervous/anxious.   All other systems reviewed and are negative.   Updated Vital Signs BP 105/68   Pulse 74   Temp 97.8 F (36.6 C) (Oral)   Resp 19   Ht 5' 4 (1.626 m)   Wt 65.8 kg   LMP 01/11/2013   SpO2 97%   BMI 24.89 kg/m   Physical Exam Vitals and nursing note reviewed.  Constitutional:      General: She is not in acute distress.    Appearance: Normal appearance. She is well-developed. She is not ill-appearing, toxic-appearing or diaphoretic.  HENT:     Head: Normocephalic and atraumatic.     Right Ear: External ear normal.     Left Ear: External ear normal.     Nose: Nose normal.     Mouth/Throat:     Mouth: Mucous membranes are moist.   Eyes:  Extraocular Movements: Extraocular movements intact.     Conjunctiva/sclera: Conjunctivae normal.    Cardiovascular:     Rate and Rhythm: Normal rate and regular rhythm.     Heart sounds: No murmur heard. Pulmonary:     Effort: Pulmonary effort is normal. No respiratory distress.     Breath sounds: Normal breath sounds. No wheezing or rales.  Abdominal:     General: There is no distension.     Palpations: Abdomen is soft.     Tenderness: There is abdominal tenderness. There is no guarding or rebound.   Musculoskeletal:        General: No swelling.     Cervical back: Normal range of motion and neck supple.   Skin:    General: Skin is warm and dry.     Coloration: Skin is not jaundiced or  pale.   Neurological:     General: No focal deficit present.     Mental Status: She is alert and oriented to person, place, and time.   Psychiatric:        Mood and Affect: Mood normal.        Behavior: Behavior normal.     (all labs ordered are listed, but only abnormal results are displayed) Labs Reviewed  COMPREHENSIVE METABOLIC PANEL WITH GFR - Abnormal; Notable for the following components:      Result Value   Glucose, Bld 106 (*)    All other components within normal limits  CBC WITH DIFFERENTIAL/PLATELET - Abnormal; Notable for the following components:   Hemoglobin 15.4 (*)    HCT 46.1 (*)    All other components within normal limits  CBG MONITORING, ED - Abnormal; Notable for the following components:   Glucose-Capillary 120 (*)    All other components within normal limits  LIPASE, BLOOD  MAGNESIUM   TROPONIN I (HIGH SENSITIVITY)  TROPONIN I (HIGH SENSITIVITY)    EKG: EKG Interpretation Date/Time:  Monday July 02 2023 08:56:50 EDT Ventricular Rate:  78 PR Interval:  161 QRS Duration:  79 QT Interval:  373 QTC Calculation: 425 R Axis:   20  Text Interpretation: Sinus rhythm Confirmed by Melvenia Motto [phone]) on 07/02/2023 1:11:33 PM  Radiology: CT ABDOMEN PELVIS W CONTRAST Result Date: 07/02/2023 CLINICAL DATA:  Abdominal pain, constipation. EXAM: CT ABDOMEN AND PELVIS WITH CONTRAST TECHNIQUE: Multidetector CT imaging of the abdomen and pelvis was performed using the standard protocol following bolus administration of intravenous contrast. RADIATION DOSE REDUCTION: This exam was performed according to the departmental dose-optimization program which includes automated exposure control, adjustment of the mA and/or kV according to patient size and/or use of iterative reconstruction technique. CONTRAST:  100mL OMNIPAQUE IOHEXOL 300 MG/ML  SOLN COMPARISON:  09/29/2005. FINDINGS: Lower chest: Minimal dependent atelectasis. Heart is at the upper limits of normal in size. No  pericardial or pleural effusion. Distal esophagus is grossly unremarkable. Hepatobiliary: Liver is decreased in attenuation diffusely with scattered cysts. No specific follow-up necessary. Liver and gallbladder are otherwise unremarkable. No biliary ductal dilatation. Pancreas: Negative. Spleen: Negative. Adrenals/Urinary Tract: Adrenal glands are unremarkable. Small low-attenuation lesions in the kidneys. No specific follow-up necessary. Small left renal stones. Kidneys are otherwise unremarkable. Ureters are decompressed. Bladder is grossly unremarkable. Stomach/Bowel: Incidental note is made of a duodenal diverticulum. Stomach, small bowel and colon are otherwise unremarkable. Appendectomy. Vascular/Lymphatic: Vascular structures are unremarkable. No pathologically enlarged lymph nodes. Reproductive: Uterus is visualized.  No adnexal mass. Other: No free fluid.  Mesenteries and peritoneum are unremarkable. Musculoskeletal: Degenerative changes  in the spine. IMPRESSION: 1. No findings to explain the patient's clinical history. 2. Hepatic steatosis. 3. Small left renal stones. Electronically Signed   By: Newell Eke M.D.   On: 07/02/2023 11:12   DG Chest Portable 1 View Result Date: 07/02/2023 CLINICAL DATA:  Constipation, anxiety. EXAM: PORTABLE CHEST 1 VIEW COMPARISON:  05/25/2023. FINDINGS: Trachea is midline. Heart size normal. Lungs are clear. No pleural fluid. IMPRESSION: No acute findings. Electronically Signed   By: Newell Eke M.D.   On: 07/02/2023 09:12     Procedures   Medications Ordered in the ED  iohexol (OMNIPAQUE) 300 MG/ML solution 100 mL (100 mLs Intravenous Contrast Given 07/02/23 1047)  ondansetron  (ZOFRAN ) injection 4 mg (4 mg Intravenous Given 07/02/23 1316)                                    Medical Decision Making Amount and/or Complexity of Data Reviewed Labs: ordered. Radiology: ordered.  Risk Prescription drug management.   This patient presents to the ED  for concern of constipation, this involves an extensive number of treatment options, and is a complaint that carries with it a high risk of complications and morbidity.  The differential diagnosis includes potential constipation, bowel obstruction, neoplasm, anxiety   Co morbidities / Chronic conditions that complicate the patient evaluation  anxiety, arthritis, HTN, prediabetes, HLD, anxiety   Additional history obtained:  Additional history obtained from EMR External records from outside source obtained and reviewed including N/A   Lab Tests:  I Ordered, and personally interpreted labs.  The pertinent results include: Normal hemoglobin, no leukocytosis, normal kidney function, normal electrolytes.  Normal troponin.   Imaging Studies ordered:  I ordered imaging studies including chest x-ray, CT of abdomen and pelvis I independently visualized and interpreted imaging which showed no acute findings I agree with the radiologist interpretation   Cardiac Monitoring: / EKG:  The patient was maintained on a cardiac monitor.  I personally viewed and interpreted the cardiac monitored which showed an underlying rhythm of: Sinus rhythm   Problem List / ED Course / Critical interventions / Medication management  Patient presented for multiple complaints.  She has had acute on chronic constipation over the past month, worsened over the past few days.  She did have a large bowel movement this morning after multimodal bowel regimen.  She subsequently had symptoms of nausea, left arm tingling, increased anxiety.  The symptoms did resolve prior to arrival.  On arrival, vital signs are normal.  Patient is well-appearing on exam.  Her abdomen is soft.  She does endorse some mild generalized tenderness.  EKG shows normal sinus rhythm.  Workup was initiated.  Lab work and imaging studies were unremarkable.  Patient did have recurrence of nausea while in the ED.  Dose of Zofran  was given.  On  reassessment, patient resting comfortably.  She was informed of her reassuring workup.  She states that she has had a colonoscopy in the past.  Per chart review, this was in 2016.  She has not followed up with GI since that time.  Patient was given contact information for GI follow-up.  She was advised to continue her bowel regimen to add on a daily fiber supplement.  She was discharged in stable condition. I ordered medication including Zofran  for nausea Reevaluation of the patient after these medicines showed that the patient improved I have reviewed the patients home medicines and  have made adjustments as needed  Social Determinants of Health:  Lives independently      Final diagnoses:  Constipation, unspecified constipation type    ED Discharge Orders     None          Melvenia Motto, MD 07/02/23 1342

## 2023-07-02 NOTE — ED Triage Notes (Signed)
 Patient came from home with constipation and anxiety. LBM this morning after taking miralax  with coffee and 2 stool softeners last night. 1 phillip laxative pill yesterday afternoon. BM was not normal for patient and was like thin  ribbons. Has been constipated for over a month and states it may be due to anxiety. Takes buspar  for anxiety.  Has had intermittent nausea. Normally has a BM once every 2-3 days.

## 2023-07-02 NOTE — Telephone Encounter (Signed)
 Copied from CRM [id number]. Topic: Clinical - Medication Question >> Jul 02, 2023  2:20 PM Berwyn MATSU wrote: Reason for CRM: busPIRone  (BUSPAR ) 7.5 MG tablet patient is calling in requesting to up the dosage on this medication.   May you please advise.

## 2023-07-02 NOTE — Assessment & Plan Note (Signed)
 Ongoing.   -Will add Lexapro for ongoing anxiety.  Discussed that it may take 4 to 6 weeks for the Lexapro to take full effect, therefore she can still use the BuSpar  2-3 times a day as needed for breakthrough anxiety.  -She has also been referred to psychiatry for further evaluation and treatment.  -Recommend follow-up in 4 to 6 weeks to evaluate how she is doing.

## 2023-07-03 ENCOUNTER — Telehealth: Payer: Self-pay

## 2023-07-03 NOTE — Telephone Encounter (Signed)
 Copied from CRM [id number]. Topic: Clinical - Medication Question >> Jul 02, 2023  2:20 PM Berwyn MATSU wrote: Reason for CRM: busPIRone  (BUSPAR ) 7.5 MG tablet patient is calling in requesting to up the dosage on this medication.   May you please advise.

## 2023-07-05 ENCOUNTER — Telehealth: Payer: Self-pay

## 2023-07-05 NOTE — Telephone Encounter (Signed)
 Copied from CRM [id number]. Topic: Clinical - Medication Question >> Jul 05, 2023  9:04 AM Marissa P wrote: Reason for CRM: Patient would like to speak to nurse in regards to busPIRone  (BUSPAR ) 15 MG tablet which she believes is making her feel more nausea because of the higher dosage, please give her a call back [phone]

## 2023-07-11 ENCOUNTER — Other Ambulatory Visit: Payer: Self-pay | Admitting: Internal Medicine

## 2023-07-12 ENCOUNTER — Ambulatory Visit: Payer: Self-pay

## 2023-07-12 VITALS — BP 111/76 | HR 90 | Ht 64.0 in | Wt 142.0 lb

## 2023-07-12 DIAGNOSIS — R110 Nausea: Secondary | ICD-10-CM | POA: Diagnosis not present

## 2023-07-12 DIAGNOSIS — F419 Anxiety disorder, unspecified: Secondary | ICD-10-CM | POA: Diagnosis not present

## 2023-07-12 MED ORDER — SERTRALINE HCL 25 MG PO TABS: 25.0000 mg | ORAL_TABLET | Freq: Every day | ORAL | 3 refills | Status: DC

## 2023-07-12 MED ORDER — PROMETHAZINE HCL 25 MG PO TABS: 25.0000 mg | ORAL_TABLET | Freq: Three times a day (TID) | ORAL | 0 refills | Status: DC | PRN

## 2023-07-12 NOTE — Assessment & Plan Note (Signed)
 Will have her stop the BuSpar  and add sertraline  daily for anxiety. Advised to watch for worsening of anxiety or any depression. Follow-up in 3 months.

## 2023-07-12 NOTE — Patient Instructions (Signed)
 Stop taking the Zofran  for nausea and phenergan  added in its place.    Stop taking the buspar , and start taking Zoloft  (sertraline ) tomorrow.    F/U with GI next week.

## 2023-07-12 NOTE — Progress Notes (Signed)
   Established Patient Office Visit  Subjective   Patient ID: [name], female    DOB: Nov 20, 1960  Age: 63 y.o. MRN: [id number]  Chief Complaint  Patient presents with   Medical Management of Chronic Issues    Hospital follow up    HPI Patient is here today for follow-up of ongoing anxiety issues and constipation.  She has been tried on multiple medications since anxiety started suddenly in January.  The anxiety stemmed from neck, right arm pain and chest pain.  She has been evaluated by cardiology and found to not presently have cardiac issues.  She has been tried on fluoxetine  and is currently taking BuSpar .  She feels that the BuSpar  is making her nauseous when increased to higher dose and would like to come off of this altogether.  She has been taking Zofran  for her nausea every morning.  She was recently evaluated in the ER for her constipation.    ROS    Objective:     BP 111/76   Pulse 90   Ht 5' 4 (1.626 m)   Wt 142 lb 0.6 oz (64.4 kg)   LMP 01/11/2013   SpO2 95%   BMI 24.38 kg/m    Physical Exam Vitals and nursing note reviewed.  Constitutional:      Appearance: Normal appearance.  HENT:     Head: Normocephalic.  Eyes:     Extraocular Movements: Extraocular movements intact.     Pupils: Pupils are equal, round, and reactive to light.  Cardiovascular:     Rate and Rhythm: Normal rate and regular rhythm.  Pulmonary:     Effort: Pulmonary effort is normal.     Breath sounds: Normal breath sounds.  Musculoskeletal:     Cervical back: Normal range of motion and neck supple.  Neurological:     Mental Status: She is alert and oriented to person, place, and time.  Psychiatric:        Mood and Affect: Mood normal.        Thought Content: Thought content normal.      No results found for any visits on 07/12/23.    The 10-year ASCVD risk score (Arnett DK, et al., 2019) is: 5.1%    Assessment & Plan:   Problem List Items Addressed This Visit        Other   Anxiety - Primary   Will have her stop the BuSpar  and add sertraline  daily for anxiety. Advised to watch for worsening of anxiety or any depression. Follow-up in 3 months.       Relevant Medications   sertraline  (ZOLOFT ) 25 MG tablet   Other Visit Diagnoses       Nausea       Advised her to stop the Zofran , as this is probably the cause of her chronic constipation.  She has upcoming appointment with GI next week.   Relevant Medications   promethazine  (PHENERGAN ) 25 MG tablet       No follow-ups on file.    Leita Longs, FNP

## 2023-07-17 ENCOUNTER — Ambulatory Visit: Payer: Self-pay

## 2023-07-17 NOTE — Telephone Encounter (Signed)
    FYI Only or Action Required?: Action required by provider: clinical question for provider and update on patient condition.  Patient was last seen in primary care on 07/12/2023 by [name], FNP.  Called Nurse Triage reporting Anxiety.  Symptoms began today.  Interventions attempted: Prescription medications: see note .  Symptoms are: gradually worsening.  Triage Disposition: No disposition on file. See note   Patient/caregiver understands and will follow disposition?:    Copied from CRM [id number]. Topic: Clinical - Red Word Triage >> Jul 17, 2023  4:04 PM Tobias L wrote: Red Word that prompted transfer to Nurse Triage: patient very anxious, would like advice on anxiety medication Answer Assessment - Initial Assessment Questions 1. REASON FOR CALL: What is the main reason for your call? or How can I best help you?     Med change 07/12/23.   Patient reports that she stopped taking all medications 07/13/23 Relays she was feeling better, that is why she stopped all the medication.   Patient reports her anxiety is now increasing, so she wants to know if she can take her Lorazepam  now and start her Sertraline  tonight.    [facility] line called appropriately- informed that they will call the patient appropriately to advise her.    Patient verbalized understanding.  Protocols used: Information Only Call - No Triage-A-AH

## 2023-07-17 NOTE — Progress Notes (Unsigned)
 Referring Provider:*** Primary Care Physician:  [name], FNP Primary Gastroenterologist:  Dr. PIERRETTE Johns chief complaint on file.   HPI:   [name] is a 63 y.o. female presenting today for further evaluation of constipation.  Reviewed recent ER evaluation 07/02/2023.  Patient presented with nausea, worsening constipation over the last month.  Taking a stool softener daily at baseline with a bowel movement every 2 to 3 days.  Took MiraLAX  the morning she presented to the ER to help with constipation and subsequently had a large bowel movement, but then had a episode of not feeling herself.  Had some left arm tingling and nausea.  Nausea resolved with Zofran .  On the way to the hospital, symptoms improved.  Laboratory evaluation including troponin x 2 with no significant abnormalities.  EKG unrevealing.  CT A/P with contrast with no acute abnormalities.  She was advised to continue her current bowel regimen and follow-up with GI.   Today:     Last colonoscopy 01/01/2015: 1 sigmoid polyp removed.  Internal hemorrhoids.  Pathology with tubular adenoma.  Recommended repeat colonoscopy in 5-10 years.  Past Medical History:  Diagnosis Date   Anxiety    Arthritis    Hypothyroidism     Past Surgical History:  Procedure Laterality Date   APPENDECTOMY     Bilateral bunionectomy     COLONOSCOPY N/A 01/01/2015   Procedure: COLONOSCOPY;  Surgeon: Margo LITTIE Haddock, MD;  Location: [facility];  Service: Endoscopy;  Laterality: N/A;  11:15 AM - pt knows to arrive at 11:15   LID LESION EXCISION Right 12/29/2020   Procedure: LID LESION EXCISION;  Surgeon: Jacques Sharper, MD;  Location: [facility];  Service: Ophthalmology;  Laterality: Right;  LOCAL   THYROIDECTOMY N/A 08/15/2013   Procedure: TOTAL THYROIDECTOMY;  Surgeon: Oneil DELENA Budge, MD;  Location: [facility];  Service: General;  Laterality: N/A;    Current Outpatient Medications  Medication Sig Dispense Refill    levothyroxine  (SYNTHROID ) 88 MCG tablet Take 1 tablet (88 mcg total) by mouth daily before breakfast. 90 tablet 3   Multiple Vitamin (MULTIVITAMIN) tablet Take 1 tablet by mouth daily.     promethazine  (PHENERGAN ) 25 MG tablet Take 1 tablet (25 mg total) by mouth every 8 (eight) hours as needed for nausea. 20 tablet 0   rosuvastatin  (CRESTOR ) 20 MG tablet TAKE ONE TABLET BY MOUTH ONCE DAILY. 90 tablet 1   sertraline  (ZOLOFT ) 25 MG tablet Take 1 tablet (25 mg total) by mouth daily. 30 tablet 3   No current facility-administered medications for this visit.    Allergies as of 07/19/2023   (No Known Allergies)    No family history on file.  Social History   Socioeconomic History   Marital status: Single    Spouse name: Not on file   Number of children: Not on file   Years of education: Not on file   Highest education level: Not on file  Occupational History   Not on file  Tobacco Use   Smoking status: Every Day    Current packs/day: 0.10    Average packs/day: 0.1 packs/day for 15.0 years (1.5 ttl pk-yrs)    Types: Cigarettes    Passive exposure: Past   Smokeless tobacco: Never  Vaping Use   Vaping status: Never Used  Substance and Sexual Activity   Alcohol use: No   Drug use: No   Sexual activity: Yes    Birth control/protection: None  Other Topics Concern  Not on file  Social History Narrative   Not on file   Social Drivers of Health   Financial Resource Strain: Low Risk  (05/06/2020)   Overall Financial Resource Strain (CARDIA)    Difficulty of Paying Living Expenses: Not hard at all  Food Insecurity: No Food Insecurity (05/06/2020)   Hunger Vital Sign    Worried About Running Out of Food in the Last Year: Never true    Ran Out of Food in the Last Year: Never true  Transportation Needs: No Transportation Needs (05/06/2020)   PRAPARE - Administrator, Civil Service (Medical): No    Lack of Transportation (Non-Medical): No  Physical Activity: Sufficiently  Active (05/06/2020)   Exercise Vital Sign    Days of Exercise per Week: 5 days    Minutes of Exercise per Session: 30 min  Stress: No Stress Concern Present (05/06/2020)   [employer] of Occupational Health - Occupational Stress Questionnaire    Feeling of Stress : Not at all  Social Connections: Moderately Isolated (05/06/2020)   Social Connection and Isolation Panel    Frequency of Communication with Friends and Family: More than three times a week    Frequency of Social Gatherings with Friends and Family: More than three times a week    Attends Religious Services: Never    Database administrator or Organizations: No    Attends Banker Meetings: Never    Marital Status: Living with partner  Intimate Partner Violence: Not At Risk (05/06/2020)   Humiliation, Afraid, Rape, and Kick questionnaire    Fear of Current or Ex-Partner: No    Emotionally Abused: No    Physically Abused: No    Sexually Abused: No    Review of Systems: Gen: Denies any fever, chills, fatigue, weight loss, lack of appetite.  CV: Denies chest pain, heart palpitations, peripheral edema, syncope.  Resp: Denies shortness of breath at rest or with exertion. Denies wheezing or cough.  GI: Denies dysphagia or odynophagia. Denies jaundice, hematemesis, fecal incontinence. GU : Denies urinary burning, urinary frequency, urinary hesitancy MS: Denies joint pain, muscle weakness, cramps, or limitation of movement.  Derm: Denies rash, itching, dry skin Psych: Denies depression, anxiety, memory loss, and confusion Heme: Denies bruising, bleeding, and enlarged lymph nodes.  Physical Exam: LMP 01/11/2013  General:   Alert and oriented. Pleasant and cooperative. Well-nourished and well-developed.  Head:  Normocephalic and atraumatic. Eyes:  Without icterus, sclera clear and conjunctiva pink.  Ears:  Normal auditory acuity. Lungs:  Clear to auscultation bilaterally. No wheezes, rales, or rhonchi. No distress.   Heart:  S1, S2 present without murmurs appreciated.  Abdomen:  +BS, soft, non-tender and non-distended. No HSM noted. No guarding or rebound. No masses appreciated.  Rectal:  Deferred  Msk:  Symmetrical without gross deformities. Normal posture. Extremities:  Without edema. Neurologic:  Alert and  oriented x4;  grossly normal neurologically. Skin:  Intact without significant lesions or rashes. Psych:  Alert and cooperative. Normal mood and affect.    Assessment:     Plan:  ***   Josette Centers, PA-C [facility] 07/19/2023

## 2023-07-17 NOTE — Telephone Encounter (Signed)
 FYI Only or Action Required?: FYI only for provider.  Patient was last seen in primary care on 07/12/2023 by [name], FNP.  Called Nurse Triage reporting Anxiety.  Symptoms began today.  Interventions attempted: Nothing.  Symptoms are: gradually worsening.  Triage Disposition: Go to ED Now (or PCP Triage) see note   Patient/caregiver understands and will follow disposition?: yes       Reason for Disposition  [1] SEVERE anxiety (e.g., extremely anxious with intense emotional symptoms such as feeling of unreality, urge to flee, unable to calm down; unable to cope or function) AND [2] not better after 10 minutes of reassurance and Care Advice  Answer Assessment - Initial Assessment Questions 1. CONCERN: Did anything happen that prompted you to call today?      ----------- Didn't  receive a call back from office regarding her medication. Now reporting a greater level of anxiety.   2. ANXIETY SYMPTOMS: Can you describe how you (your loved one; patient) have been feeling? (e.g., tense, restless, panicky, anxious, keyed up, overwhelmed, sense of impending doom).      --------------- Restless, panicky.   3. ONSET: How long have you been feeling this way? (e.g., hours, days, weeks)     ----------------------- Today   4. SEVERITY: How would you rate the level of anxiety? (e.g., 0 - 10; or mild, moderate, severe).     --------------------------9/10  5. FUNCTIONAL IMPAIRMENT: How have these feelings affected your ability to do daily activities? Have you had more difficulty than usual doing your normal daily activities? (e.g., getting better, same, worse; self-care, school, work, interactions)     ---------------- Not functional  6. HISTORY: Have you felt this way before? Have you ever been diagnosed with an anxiety problem in the past? (e.g., generalized anxiety disorder, panic attacks, PTSD). If Yes, ask: How was this problem treated? (e.g., medicines, counseling,  etc.)     ---Yes  7. RISK OF HARM - SUICIDAL IDEATION: Do you ever have thoughts of hurting or killing yourself? If Yes, ask:  Do you have these feelings now? Do you have a plan on how you would do this?     -------------Denies SI/HI  8. TREATMENT:  What has been done so far to treat this anxiety? (e.g., medicines, relaxation strategies). What has helped?     ------------- Nothing. Patient has stopped medications    Patient referred to local UC/ED.  Patient educated on pertinent s/s that would warrant emergent help/call 911/. Patient verbalized understanding and agrees with plan No additional questions/concerns noted during the time of the call.  Protocols used: Anxiety and Panic Attack-A-AH

## 2023-07-17 NOTE — H&P (View-Only) (Signed)
 Referring Provider:*** Primary Care Physician:  [name], FNP Primary Gastroenterologist:  Dr. PIERRETTE Johns chief complaint on file.   HPI:   [name] is a 63 y.o. female presenting today for further evaluation of constipation.  Reviewed recent ER evaluation 07/02/2023.  Patient presented with nausea, worsening constipation over the last month.  Taking a stool softener daily at baseline with a bowel movement every 2 to 3 days.  Took MiraLAX  the morning she presented to the ER to help with constipation and subsequently had a large bowel movement, but then had a episode of not feeling herself.  Had some left arm tingling and nausea.  Nausea resolved with Zofran .  On the way to the hospital, symptoms improved.  Laboratory evaluation including troponin x 2 with no significant abnormalities.  EKG unrevealing.  CT A/P with contrast with no acute abnormalities.  She was advised to continue her current bowel regimen and follow-up with GI.   Today:     Last colonoscopy 01/01/2015: 1 sigmoid polyp removed.  Internal hemorrhoids.  Pathology with tubular adenoma.  Recommended repeat colonoscopy in 5-10 years.  Past Medical History:  Diagnosis Date   Anxiety    Arthritis    Hypothyroidism     Past Surgical History:  Procedure Laterality Date   APPENDECTOMY     Bilateral bunionectomy     COLONOSCOPY N/A 01/01/2015   Procedure: COLONOSCOPY;  Surgeon: Margo LITTIE Haddock, MD;  Location: [facility];  Service: Endoscopy;  Laterality: N/A;  11:15 AM - pt knows to arrive at 11:15   LID LESION EXCISION Right 12/29/2020   Procedure: LID LESION EXCISION;  Surgeon: Jacques Sharper, MD;  Location: [facility];  Service: Ophthalmology;  Laterality: Right;  LOCAL   THYROIDECTOMY N/A 08/15/2013   Procedure: TOTAL THYROIDECTOMY;  Surgeon: Oneil DELENA Budge, MD;  Location: [facility];  Service: General;  Laterality: N/A;    Current Outpatient Medications  Medication Sig Dispense Refill    levothyroxine  (SYNTHROID ) 88 MCG tablet Take 1 tablet (88 mcg total) by mouth daily before breakfast. 90 tablet 3   Multiple Vitamin (MULTIVITAMIN) tablet Take 1 tablet by mouth daily.     promethazine  (PHENERGAN ) 25 MG tablet Take 1 tablet (25 mg total) by mouth every 8 (eight) hours as needed for nausea. 20 tablet 0   rosuvastatin  (CRESTOR ) 20 MG tablet TAKE ONE TABLET BY MOUTH ONCE DAILY. 90 tablet 1   sertraline  (ZOLOFT ) 25 MG tablet Take 1 tablet (25 mg total) by mouth daily. 30 tablet 3   No current facility-administered medications for this visit.    Allergies as of 07/19/2023   (No Known Allergies)    No family history on file.  Social History   Socioeconomic History   Marital status: Single    Spouse name: Not on file   Number of children: Not on file   Years of education: Not on file   Highest education level: Not on file  Occupational History   Not on file  Tobacco Use   Smoking status: Every Day    Current packs/day: 0.10    Average packs/day: 0.1 packs/day for 15.0 years (1.5 ttl pk-yrs)    Types: Cigarettes    Passive exposure: Past   Smokeless tobacco: Never  Vaping Use   Vaping status: Never Used  Substance and Sexual Activity   Alcohol use: No   Drug use: No   Sexual activity: Yes    Birth control/protection: None  Other Topics Concern  Not on file  Social History Narrative   Not on file   Social Drivers of Health   Financial Resource Strain: Low Risk  (05/06/2020)   Overall Financial Resource Strain (CARDIA)    Difficulty of Paying Living Expenses: Not hard at all  Food Insecurity: No Food Insecurity (05/06/2020)   Hunger Vital Sign    Worried About Running Out of Food in the Last Year: Never true    Ran Out of Food in the Last Year: Never true  Transportation Needs: No Transportation Needs (05/06/2020)   PRAPARE - Administrator, Civil Service (Medical): No    Lack of Transportation (Non-Medical): No  Physical Activity: Sufficiently  Active (05/06/2020)   Exercise Vital Sign    Days of Exercise per Week: 5 days    Minutes of Exercise per Session: 30 min  Stress: No Stress Concern Present (05/06/2020)   [employer] of Occupational Health - Occupational Stress Questionnaire    Feeling of Stress : Not at all  Social Connections: Moderately Isolated (05/06/2020)   Social Connection and Isolation Panel    Frequency of Communication with Friends and Family: More than three times a week    Frequency of Social Gatherings with Friends and Family: More than three times a week    Attends Religious Services: Never    Database administrator or Organizations: No    Attends Banker Meetings: Never    Marital Status: Living with partner  Intimate Partner Violence: Not At Risk (05/06/2020)   Humiliation, Afraid, Rape, and Kick questionnaire    Fear of Current or Ex-Partner: No    Emotionally Abused: No    Physically Abused: No    Sexually Abused: No    Review of Systems: Gen: Denies any fever, chills, fatigue, weight loss, lack of appetite.  CV: Denies chest pain, heart palpitations, peripheral edema, syncope.  Resp: Denies shortness of breath at rest or with exertion. Denies wheezing or cough.  GI: Denies dysphagia or odynophagia. Denies jaundice, hematemesis, fecal incontinence. GU : Denies urinary burning, urinary frequency, urinary hesitancy MS: Denies joint pain, muscle weakness, cramps, or limitation of movement.  Derm: Denies rash, itching, dry skin Psych: Denies depression, anxiety, memory loss, and confusion Heme: Denies bruising, bleeding, and enlarged lymph nodes.  Physical Exam: LMP 01/11/2013  General:   Alert and oriented. Pleasant and cooperative. Well-nourished and well-developed.  Head:  Normocephalic and atraumatic. Eyes:  Without icterus, sclera clear and conjunctiva pink.  Ears:  Normal auditory acuity. Lungs:  Clear to auscultation bilaterally. No wheezes, rales, or rhonchi. No distress.   Heart:  S1, S2 present without murmurs appreciated.  Abdomen:  +BS, soft, non-tender and non-distended. No HSM noted. No guarding or rebound. No masses appreciated.  Rectal:  Deferred  Msk:  Symmetrical without gross deformities. Normal posture. Extremities:  Without edema. Neurologic:  Alert and  oriented x4;  grossly normal neurologically. Skin:  Intact without significant lesions or rashes. Psych:  Alert and cooperative. Normal mood and affect.    Assessment:     Plan:  ***   Josette Centers, PA-C [facility] 07/19/2023

## 2023-07-18 ENCOUNTER — Telehealth: Payer: Self-pay

## 2023-07-18 NOTE — Telephone Encounter (Signed)
 Pt stated she came of the Buspar  7.5 mg  and she waited till last night to take the sertraline  25 mg  last night and she feels better on it and is going to continue to take it

## 2023-07-18 NOTE — Telephone Encounter (Unsigned)
 Copied from CRM [id number]. Topic: General - Call Back - No Documentation >> Jul 18, 2023 10:18 AM Avram MATSU wrote: Reason for CRM: patient is waiting to a callback, she started taking her anxiety medication and was told by NT she should receive a call today. She has a few questions about what to do?  [phone] (H)

## 2023-07-19 ENCOUNTER — Ambulatory Visit: Admitting: Gastroenterology

## 2023-07-19 ENCOUNTER — Encounter: Payer: Self-pay | Admitting: *Deleted

## 2023-07-19 ENCOUNTER — Encounter: Payer: Self-pay | Admitting: Gastroenterology

## 2023-07-19 VITALS — BP 113/73 | HR 82 | Temp 97.7°F | Ht 64.5 in | Wt 139.6 lb

## 2023-07-19 DIAGNOSIS — R630 Anorexia: Secondary | ICD-10-CM

## 2023-07-19 DIAGNOSIS — R110 Nausea: Secondary | ICD-10-CM

## 2023-07-19 DIAGNOSIS — K625 Hemorrhage of anus and rectum: Secondary | ICD-10-CM | POA: Diagnosis not present

## 2023-07-19 DIAGNOSIS — K5909 Other constipation: Secondary | ICD-10-CM | POA: Diagnosis not present

## 2023-07-19 DIAGNOSIS — R634 Abnormal weight loss: Secondary | ICD-10-CM | POA: Diagnosis not present

## 2023-07-19 DIAGNOSIS — R109 Unspecified abdominal pain: Secondary | ICD-10-CM

## 2023-07-19 DIAGNOSIS — Z8719 Personal history of other diseases of the digestive system: Secondary | ICD-10-CM

## 2023-07-19 DIAGNOSIS — Z860101 Personal history of adenomatous and serrated colon polyps: Secondary | ICD-10-CM | POA: Diagnosis not present

## 2023-07-19 DIAGNOSIS — K5900 Constipation, unspecified: Secondary | ICD-10-CM

## 2023-07-19 MED ORDER — PANTOPRAZOLE SODIUM 40 MG PO TBEC: 40.0000 mg | DELAYED_RELEASE_TABLET | Freq: Every day | ORAL | 3 refills | Status: DC

## 2023-07-19 MED ORDER — ONDANSETRON HCL 4 MG PO TABS: 4.0000 mg | ORAL_TABLET | Freq: Three times a day (TID) | ORAL | 0 refills | Status: DC | PRN

## 2023-07-19 NOTE — Patient Instructions (Addendum)
 We will arrange for you to have an upper endoscopy and colonoscopy with Dr. Cindie at [facility].  Start pantoprazole  40 mg daily.  This is best taken 30 minutes before breakfast, but this medication also needs to be taken at least 3 hours after your levothyroxine .  Use Zofran  4 mg every 8 hours as needed for nausea.  If you feel like Zofran  is not as helpful as Phenergan , you can go back to taking Phenergan  for nausea.  Start MiraLAX  1 capful (17 g) daily in 8 ounces of water  for constipation.  You can increase this to twice a day if needed.  I will plan to see you back in the office after your procedures.  It was very nice to meet you today!  Josette Centers, PA-C [facility]

## 2023-07-20 NOTE — Telephone Encounter (Signed)
 Pt advised with verbal understanding

## 2023-07-20 NOTE — Telephone Encounter (Signed)
 Pt states she will take it in the morning and pt is requesting something for sleep

## 2023-07-20 NOTE — Telephone Encounter (Signed)
 Please advise    Copied from CRM [id number]. Topic: Clinical - Medication Question >> Jul 20, 2023  7:51 AM Willma R wrote: Reason for CRM: Patient has been taking sertraline  (ZOLOFT ) 25 MG tablet at night around 9:30. States she is waking up everyday at 2:45 am, Would lie to know if it would be okay to take in the mornings and to start this morning even though she took it last night.  Patient can be reached at [phone]

## 2023-07-23 ENCOUNTER — Encounter (HOSPITAL_COMMUNITY)
Admission: RE | Admit: 2023-07-23 | Discharge: 2023-07-23 | Disposition: A | Discharge status: Home / Self Care | Source: Ambulatory Visit | Attending: Internal Medicine | Admitting: Internal Medicine

## 2023-07-26 ENCOUNTER — Ambulatory Visit (HOSPITAL_COMMUNITY): Payer: Self-pay | Admitting: Registered Nurse

## 2023-07-27 ENCOUNTER — Ambulatory Visit

## 2023-07-27 ENCOUNTER — Ambulatory Visit: Payer: Medicaid Other | Admitting: Internal Medicine

## 2023-07-27 VITALS — BP 105/70 | HR 87 | Ht 64.0 in | Wt 140.1 lb

## 2023-07-27 DIAGNOSIS — G6289 Other specified polyneuropathies: Secondary | ICD-10-CM

## 2023-07-27 DIAGNOSIS — F419 Anxiety disorder, unspecified: Secondary | ICD-10-CM | POA: Diagnosis not present

## 2023-07-27 MED ORDER — SERTRALINE HCL 50 MG PO TABS: 50.0000 mg | ORAL_TABLET | Freq: Every day | ORAL | 3 refills | Status: DC

## 2023-07-27 MED ORDER — GABAPENTIN 300 MG PO CAPS: 300.0000 mg | ORAL_CAPSULE | Freq: Two times a day (BID) | ORAL | 3 refills | Status: DC

## 2023-07-27 NOTE — Progress Notes (Signed)
 Established Patient Office Visit  Subjective   Patient ID: [name], female    DOB: [birth year]  Age: 63 y.o. 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   Neck pain 02/02/2023   Cervical pain (neck) 01/31/2023   Tingling in extremities 01/31/2023   Encounter for examination following treatment at hospital 01/31/2023   Tobacco use 10/11/2021   Encounter to establish care 10/11/2021   Prediabetes 03/31/2019   Hyperlipidemia 09/23/2015   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  Constitutional:      Appearance: Normal appearance.  HENT:     Head: Normocephalic.  Eyes:     Extraocular Movements: Extraocular movements intact.     Pupils: Pupils are equal, round, and reactive to light.  Cardiovascular:     Rate and Rhythm: Normal rate and regular rhythm.  Pulmonary:     Effort:  Pulmonary effort is normal.     Breath sounds: Normal breath sounds.  Musculoskeletal:     Cervical back: Normal range of motion and neck supple.  Neurological:     Mental Status: She is alert and oriented to person, place, and time.  Psychiatric:        Attention and Perception: Attention normal.        Mood and Affect: Affect normal. Mood is anxious.        Speech: Speech is rapid and pressured.        Behavior: Behavior is cooperative.        Thought Content: Thought content normal. Thought content does not include suicidal ideation. Thought content does not include suicidal plan.        Cognition and Memory: Cognition normal.      No results found for any visits on 07/27/23.    The 10-year ASCVD risk score (Arnett DK, et al., 2019) is: 4.4%    Assessment & Plan:   Problem List Items Addressed This Visit       Nervous and Auditory   Peripheral neuropathy - Primary   Add gabapentin  300 mg at night for symptoms of neuropathy.  Recommend follow-up if no improvement.      Relevant Medications   gabapentin  (NEURONTIN ) 300 MG capsule     Other  Anxiety   She feels like she has had some improvement with the 25 mg of sertraline , but now not working as well.  Will increase sertraline  to 50 mg.  Advised to watch for worsening of anxiety and depression.  recommend follow-up in 3 months or sooner if needed       Return in about 2 months (around 09/27/2023) for for recheck of symptoms.    Leita Longs, FNP

## 2023-07-27 NOTE — Patient Instructions (Signed)
 Increase sertraline  to 50 mg daily.    Add Gabapentin  1-2 times a day for neuropathy pain.  Start taking at bedtime.  Add daytime dose if needed.

## 2023-07-30 ENCOUNTER — Ambulatory Visit: Payer: Self-pay

## 2023-07-30 ENCOUNTER — Ambulatory Visit (HOSPITAL_COMMUNITY): Payer: Self-pay | Admitting: Anesthesiology

## 2023-07-30 ENCOUNTER — Encounter (HOSPITAL_COMMUNITY): Payer: Self-pay | Admitting: Internal Medicine

## 2023-07-30 ENCOUNTER — Ambulatory Visit (HOSPITAL_COMMUNITY)
Admission: RE | Admit: 2023-07-30 | Discharge: 2023-07-30 | Disposition: A | Discharge status: Home / Self Care | Attending: Internal Medicine | Admitting: Internal Medicine

## 2023-07-30 ENCOUNTER — Telehealth: Payer: Self-pay

## 2023-07-30 ENCOUNTER — Encounter (HOSPITAL_COMMUNITY)
Admission: RE | Disposition: A | Discharge status: Home / Self Care | Payer: Self-pay | Source: Home / Self Care | Attending: Internal Medicine

## 2023-07-30 ENCOUNTER — Encounter (HOSPITAL_COMMUNITY): Payer: Self-pay | Admitting: Anesthesiology

## 2023-07-30 ENCOUNTER — Other Ambulatory Visit: Payer: Self-pay

## 2023-07-30 DIAGNOSIS — I10 Essential (primary) hypertension: Secondary | ICD-10-CM | POA: Insufficient documentation

## 2023-07-30 DIAGNOSIS — R6881 Early satiety: Secondary | ICD-10-CM

## 2023-07-30 DIAGNOSIS — R1013 Epigastric pain: Secondary | ICD-10-CM | POA: Diagnosis not present

## 2023-07-30 DIAGNOSIS — F419 Anxiety disorder, unspecified: Secondary | ICD-10-CM | POA: Diagnosis not present

## 2023-07-30 DIAGNOSIS — D120 Benign neoplasm of cecum: Secondary | ICD-10-CM | POA: Diagnosis not present

## 2023-07-30 DIAGNOSIS — K5909 Other constipation: Secondary | ICD-10-CM | POA: Diagnosis not present

## 2023-07-30 DIAGNOSIS — R630 Anorexia: Secondary | ICD-10-CM | POA: Diagnosis not present

## 2023-07-30 DIAGNOSIS — E785 Hyperlipidemia, unspecified: Secondary | ICD-10-CM | POA: Insufficient documentation

## 2023-07-30 DIAGNOSIS — E039 Hypothyroidism, unspecified: Secondary | ICD-10-CM | POA: Insufficient documentation

## 2023-07-30 DIAGNOSIS — K625 Hemorrhage of anus and rectum: Secondary | ICD-10-CM

## 2023-07-30 DIAGNOSIS — R1030 Lower abdominal pain, unspecified: Secondary | ICD-10-CM

## 2023-07-30 DIAGNOSIS — K648 Other hemorrhoids: Secondary | ICD-10-CM | POA: Diagnosis not present

## 2023-07-30 DIAGNOSIS — K2970 Gastritis, unspecified, without bleeding: Secondary | ICD-10-CM | POA: Insufficient documentation

## 2023-07-30 DIAGNOSIS — Z6823 Body mass index (BMI) 23.0-23.9, adult: Secondary | ICD-10-CM | POA: Diagnosis not present

## 2023-07-30 DIAGNOSIS — F17210 Nicotine dependence, cigarettes, uncomplicated: Secondary | ICD-10-CM | POA: Diagnosis not present

## 2023-07-30 DIAGNOSIS — R634 Abnormal weight loss: Secondary | ICD-10-CM | POA: Diagnosis not present

## 2023-07-30 DIAGNOSIS — R110 Nausea: Secondary | ICD-10-CM | POA: Diagnosis not present

## 2023-07-30 DIAGNOSIS — G709 Myoneural disorder, unspecified: Secondary | ICD-10-CM | POA: Diagnosis not present

## 2023-07-30 DIAGNOSIS — D122 Benign neoplasm of ascending colon: Secondary | ICD-10-CM | POA: Insufficient documentation

## 2023-07-30 DIAGNOSIS — K635 Polyp of colon: Secondary | ICD-10-CM | POA: Diagnosis not present

## 2023-07-30 SURGERY — COLONOSCOPY
Anesthesia: General

## 2023-07-30 MED ORDER — PROPOFOL 500 MG/50ML IV EMUL
INTRAVENOUS | Status: DC | PRN
  Administered 2023-07-30: 100 mg via INTRAVENOUS
  Administered 2023-07-30 (×2): 30 mg via INTRAVENOUS

## 2023-07-30 MED ORDER — LIDOCAINE 2% (20 MG/ML) 5 ML SYRINGE
INTRAMUSCULAR | Status: DC | PRN
  Administered 2023-07-30: 80 mg via INTRAVENOUS

## 2023-07-30 MED ORDER — LACTATED RINGERS IV SOLN: INTRAVENOUS | Status: DC

## 2023-07-30 NOTE — Transfer of Care (Signed)
 Immediate Anesthesia Transfer of Care Note  Patient: [name]  Procedure(s) Performed: COLONOSCOPY EGD (ESOPHAGOGASTRODUODENOSCOPY)  Patient Location: Endoscopy Unit  Anesthesia Type:MAC  Level of Consciousness: awake, alert , and oriented  Airway & Oxygen  Therapy: Patient Spontanous Breathing and Patient connected to nasal cannula oxygen   Post-op Assessment: Report given to RN and Post -op Vital signs reviewed and stable  Post vital signs: Reviewed and stable  Last Vitals:  Vitals Value Taken Time  BP 94/63 07/30/23 12:56  Temp 36.4 C 07/30/23 12:56  Pulse 71 07/30/23 12:56  Resp 18 07/30/23 12:56  SpO2 100 % 07/30/23 12:56    Last Pain:  Vitals:   07/30/23 1256  TempSrc: Oral  PainSc: 0-No pain      Patients Stated Pain Goal: 5 (07/30/23 1148)  Complications: No notable events documented.

## 2023-07-30 NOTE — Discharge Instructions (Addendum)
 EGD Discharge instructions Please read the instructions outlined below and refer to this sheet in the next few weeks. These discharge instructions provide you with general information on caring for yourself after you leave the hospital. Your doctor may also give you specific instructions. While your treatment has been planned according to the most current medical practices available, unavoidable complications occasionally occur. If you have any problems or questions after discharge, please call your doctor. ACTIVITY You may resume your regular activity but move at a slower pace for the next 24 hours.  Take frequent rest periods for the next 24 hours.  Walking will help expel (get rid of) the air and reduce the bloated feeling in your abdomen.  No driving for 24 hours (because of the anesthesia (medicine) used during the test).  You may shower.  Do not sign any important legal documents or operate any machinery for 24 hours (because of the anesthesia used during the test).  NUTRITION Drink plenty of fluids.  You may resume your normal diet.  Begin with a light meal and progress to your normal diet.  Avoid alcoholic beverages for 24 hours or as instructed by your caregiver.  MEDICATIONS You may resume your normal medications unless your caregiver tells you otherwise.  WHAT YOU CAN EXPECT TODAY You may experience abdominal discomfort such as a feeling of fullness or "gas" pains.  FOLLOW-UP Your doctor will discuss the results of your test with you.  SEEK IMMEDIATE MEDICAL ATTENTION IF ANY OF THE FOLLOWING OCCUR: Excessive nausea (feeling sick to your stomach) and/or vomiting.  Severe abdominal pain and distention (swelling).  Trouble swallowing.  Temperature over 101 F (37.8 C).  Rectal bleeding or vomiting of blood.     Colonoscopy Discharge Instructions  Read the instructions outlined below and refer to this sheet in the next few weeks. These discharge instructions provide you with  general information on caring for yourself after you leave the hospital. Your doctor may also give you specific instructions. While your treatment has been planned according to the most current medical practices available, unavoidable complications occasionally occur.   ACTIVITY You may resume your regular activity, but move at a slower pace for the next 24 hours.  Take frequent rest periods for the next 24 hours.  Walking will help get rid of the air and reduce the bloated feeling in your belly (abdomen).  No driving for 24 hours (because of the medicine (anesthesia) used during the test).   Do not sign any important legal documents or operate any machinery for 24 hours (because of the anesthesia used during the test).  NUTRITION Drink plenty of fluids.  You may resume your normal diet as instructed by your doctor.  Begin with a light meal and progress to your normal diet. Heavy or fried foods are harder to digest and may make you feel sick to your stomach (nauseated).  Avoid alcoholic beverages for 24 hours or as instructed.  MEDICATIONS You may resume your normal medications unless your doctor tells you otherwise.  WHAT YOU CAN EXPECT TODAY Some feelings of bloating in the abdomen.  Passage of more gas than usual.  Spotting of blood in your stool or on the toilet paper.  IF YOU HAD POLYPS REMOVED DURING THE COLONOSCOPY: No aspirin products for 7 days or as instructed.  No alcohol for 7 days or as instructed.  Eat a soft diet for the next 24 hours.  FINDING OUT THE RESULTS OF YOUR TEST Not all test results are  available during your visit. If your test results are not back during the visit, make an appointment with your caregiver to find out the results. Do not assume everything is normal if you have not heard from your caregiver or the medical facility. It is important for you to follow up on all of your test results.  SEEK IMMEDIATE MEDICAL ATTENTION IF: You have more than a spotting of  blood in your stool.  Your belly is swollen (abdominal distention).  You are nauseated or vomiting.  You have a temperature over 101.  You have abdominal pain or discomfort that is severe or gets worse throughout the day.   Your EGD revealed mild amount inflammation in your stomach.  I took biopsies of this to rule out infection with a bacteria called H. pylori.  Await pathology results, my office will contact you.  Esophagus and small bowel were normal.   Your colonoscopy revealed 2 polyp(s) which I removed successfully. Await pathology results, my office will contact you. I recommend repeating colonoscopy in 7 years for surveillance purposes.   Overall, your colon appeared very healthy.  I did not see any active inflammation indicative of underlying inflammatory bowel disease such as Crohn's disease or ulcerative colitis throughout your colon or end portion of your small bowel.    Follow up in GI office in 6-8 weeks.   I hope you have a great rest of your week!  [name]. [name], D.O. [facility]

## 2023-07-30 NOTE — Op Note (Signed)
 [facility] Patient Name: [name] Procedure Date: 07/30/2023 12:03 PM MRN: [id number] Date of Birth: 22-Feb-1960 Attending MD: Carlin POUR. Cindie , [place], [id number] CSN: [id number] Age: 63 Admit Type: Outpatient Procedure:                Colonoscopy Indications:              Rectal bleeding Providers:                Carlin POUR. Cindie, DO, Devere Lodge, Daphne Mulch                            Technician, Technician Referring MD:              Medicines:                See the Anesthesia note for documentation of the                            administered medications Complications:            No immediate complications. Estimated Blood Loss:     Estimated blood loss was minimal. Procedure:                Pre-Anesthesia Assessment:                           - The anesthesia plan was to use monitored                            anesthesia care (MAC).                           After obtaining informed consent, the colonoscope                            was passed under direct vision. Throughout the                            procedure, the patient's blood pressure, pulse, and                            oxygen  saturations were monitored continuously. The                            [id number]-HQ190L ([id number]) scope was introduced through                            the anus and advanced to the the cecum, identified                            by appendiceal orifice and ileocecal valve. The                            colonoscopy was performed without difficulty. The                            patient tolerated the procedure well. The quality  of the bowel preparation was evaluated using the                            BB[employer] Bowel Preparation Scale) with scores                            of: Right Colon = 3, Transverse Colon = 3 and Left                            Colon = 3 (entire mucosa seen well with no residual                            staining, small fragments  of stool or opaque                            liquid). The total BBPS score equals 9. Scope In: 12:33:33 PM Scope Out: 12:46:55 PM Scope Withdrawal Time: 0 hours 10 minutes 51 seconds  Total Procedure Duration: 0 hours 13 minutes 22 seconds  Findings:      Non-bleeding internal hemorrhoids were found.      Two sessile polyps were found in the ascending colon and cecum. The       polyps were 4 to 6 mm in size. These polyps were removed with a cold       snare. Resection and retrieval were complete.      The exam was otherwise without abnormality. Impression:               - Non-bleeding internal hemorrhoids.                           - Two 4 to 6 mm polyps in the ascending colon and                            in the cecum, removed with a cold snare. Resected                            and retrieved.                           - The examination was otherwise normal. Moderate Sedation:      Per Anesthesia Care Recommendation:           - Patient has a contact number available for                            emergencies. The signs and symptoms of potential                            delayed complications were discussed with the                            patient. Return to normal activities tomorrow.                            Written discharge instructions were  provided to the                            patient.                           - Resume previous diet.                           - Continue present medications.                           - Await pathology results.                           - Repeat colonoscopy in 7 years for surveillance.                           - Return to GI clinic in 8 weeks. Procedure Code(s):        --- Professional ---                           [phone], Colonoscopy, flexible; with removal of                            tumor(s), polyp(s), or other lesion(s) by snare                            technique Diagnosis Code(s):        --- Professional ---                            D12.2, Benign neoplasm of ascending colon                           D12.0, Benign neoplasm of cecum                           K64.8, Other hemorrhoids                           K62.5, Hemorrhage of anus and rectum CPT copyright 2022 American Medical Association. All rights reserved. The codes documented in this report are preliminary and upon coder review may  be revised to meet current compliance requirements. Carlin POUR. Cindie, DO Carlin POUR. Cindie, DO 07/30/2023 12:50:17 PM This report has been signed electronically. Number of Addenda: 0

## 2023-07-30 NOTE — Anesthesia Procedure Notes (Signed)
 Date/Time: 07/30/2023 12:14 PM  Performed by: Barbarann Verneita RAMAN, CRNAPre-anesthesia Checklist: Patient identified, Emergency Drugs available, Suction available, Timeout performed and Patient being monitored Patient Re-evaluated:Patient Re-evaluated prior to induction Oxygen  Delivery Method: Nasal Cannula

## 2023-07-30 NOTE — Anesthesia Procedure Notes (Signed)
 Date/Time: 07/30/2023 12:22 PM  Performed by: Barbarann Verneita RAMAN, CRNAPre-anesthesia Checklist: Patient identified, Emergency Drugs available, Suction available, Timeout performed and Patient being monitored Patient Re-evaluated:Patient Re-evaluated prior to induction Oxygen  Delivery Method: Nasal cannula Comments: Optiflow

## 2023-07-30 NOTE — Telephone Encounter (Signed)
 Copied from CRM [id number]. Topic: Clinical - Medication Question >> Jul 30, 2023  1:31 PM Sophia H wrote: Reason for CRM: patient is following up on message to PCP earlier today from [place], states she has not taken any anxiety medication today. Advised was routed over and to allow more time but patient is very adamant she needs an answer from clinic soon.

## 2023-07-30 NOTE — Interval H&P Note (Signed)
 History and Physical Interval Note:  07/30/2023 12:05 PM  [name]  has presented today for surgery, with the diagnosis of nausea, wt loss, rectal bleeding, abdominal burning.  The various methods of treatment have been discussed with the patient and family. After consideration of risks, benefits and other options for treatment, the patient has consented to  Procedure(s) with comments: COLONOSCOPY (N/A) - 130pm, asa 2 EGD (ESOPHAGOGASTRODUODENOSCOPY) (N/A) as a surgical intervention.  The patient's history has been reviewed, patient examined, no change in status, stable for surgery.  I have reviewed the patient's chart and labs.  Questions were answered to the patient's satisfaction.     Carlin MARLA Hasty

## 2023-07-30 NOTE — Op Note (Signed)
 [facility] Patient Name: [name] Procedure Date: 07/30/2023 12:04 PM MRN: [id number] Date of Birth: 1960-01-11 Attending MD: Carlin POUR. Cindie , [place], [id number] CSN: [id number] Age: 63 Admit Type: Outpatient Procedure:                Upper GI endoscopy Indications:              Epigastric abdominal pain, Early satiety, Nausea Providers:                Carlin POUR. Cindie, DO, Devere Lodge, Daphne Mulch                            Technician, Technician Referring MD:              Medicines:                See the Anesthesia note for documentation of the                            administered medications Complications:            No immediate complications. Estimated Blood Loss:     Estimated blood loss was minimal. Procedure:                Pre-Anesthesia Assessment:                           - The anesthesia plan was to use monitored                            anesthesia care (MAC).                           After obtaining informed consent, the endoscope was                            passed under direct vision. Throughout the                            procedure, the patient's blood pressure, pulse, and                            oxygen  saturations were monitored continuously. The                            [id number]-H190 ([id number]) scope was introduced through the                            mouth, and advanced to the second part of duodenum.                            The upper GI endoscopy was accomplished without                            difficulty. The patient tolerated the procedure                            well.  Scope In: 12:26:05 PM Scope Out: 12:28:54 PM Total Procedure Duration: 0 hours 2 minutes 49 seconds  Findings:      The Z-line was regular and was found 36 cm from the incisors.      Patchy mild inflammation characterized by erythema was found in the       entire examined stomach. Biopsies were taken with a cold forceps for       Helicobacter pylori testing.       The duodenal bulb, first portion of the duodenum and second portion of       the duodenum were normal. Impression:               - Z-line regular, 36 cm from the incisors.                           - Gastritis. Biopsied.                           - Normal duodenal bulb, first portion of the                            duodenum and second portion of the duodenum. Moderate Sedation:      Per Anesthesia Care Recommendation:           - Patient has a contact number available for                            emergencies. The signs and symptoms of potential                            delayed complications were discussed with the                            patient. Return to normal activities tomorrow.                            Written discharge instructions were provided to the                            patient.                           - Resume previous diet.                           - Continue present medications.                           - Await pathology results.                           - Return to GI clinic in 8 weeks. Procedure Code(s):        --- Professional ---                           [phone], Esophagogastroduodenoscopy, flexible,  transoral; with biopsy, single or multiple Diagnosis Code(s):        --- Professional ---                           K29.70, Gastritis, unspecified, without bleeding                           R10.13, Epigastric pain                           R68.81, Early satiety                           R11.0, Nausea CPT copyright 2022 American Medical Association. All rights reserved. The codes documented in this report are preliminary and upon coder review may  be revised to meet current compliance requirements. Carlin POUR. Cindie, DO Carlin POUR. Cindie, DO 07/30/2023 12:31:45 PM This report has been signed electronically. Number of Addenda: 0

## 2023-07-30 NOTE — Anesthesia Preprocedure Evaluation (Signed)
 Anesthesia Evaluation  Patient identified by MRN, date of birth, ID band Patient awake    Reviewed: Allergy & Precautions, H&P , NPO status , Patient's Chart, lab work & pertinent test results, reviewed documented beta blocker date and time   Airway Mallampati: II  TM Distance: >3 FB Neck ROM: full    Dental no notable dental hx.    Pulmonary neg pulmonary ROS, Current Smoker and Patient abstained from smoking.   Pulmonary exam normal breath sounds clear to auscultation       Cardiovascular Exercise Tolerance: Good hypertension,  Rhythm:regular Rate:Normal     Neuro/Psych   Anxiety      Neuromuscular disease  negative psych ROS   GI/Hepatic negative GI ROS, Neg liver ROS,,,  Endo/Other  Hypothyroidism    Renal/GU negative Renal ROS  negative genitourinary   Musculoskeletal   Abdominal   Peds  Hematology negative hematology ROS (+)   Anesthesia Other Findings   Reproductive/Obstetrics negative OB ROS                              Anesthesia Physical Anesthesia Plan  ASA: 2  Anesthesia Plan: General   Post-op Pain Management:    Induction:   PONV Risk Score and Plan: Propofol  infusion  Airway Management Planned:   Additional Equipment:   Intra-op Plan:   Post-operative Plan:   Informed Consent: I have reviewed the patients History and Physical, chart, labs and discussed the procedure including the risks, benefits and alternatives for the proposed anesthesia with the patient or authorized representative who has indicated his/her understanding and acceptance.     Dental Advisory Given  Plan Discussed with: CRNA  Anesthesia Plan Comments:         Anesthesia Quick Evaluation

## 2023-07-30 NOTE — Telephone Encounter (Signed)
 FYI Only or Action Required?: Action required by provider: clinical question for provider.  Patient was last seen in primary care on 07/27/2023 by [name], FNP.  Called Nurse Triage reporting Medication Problem.  Symptoms began several days ago.  Interventions attempted: Prescription medications: Zoloft .  Symptoms are: unchanged.Pt.'s Zoloft  dose increased Friday and has had side effects since then - headaches,dizziness, tingling.I can't take anymore. She said she could try something else for me. Has colonoscopy today at 1:30, will be available after that procedure.  Triage Disposition: Call PCP Now  Patient/caregiver understands and will follow disposition?: Yes   Copied from CRM #[id number]. Topic: Clinical - Red Word Triage >> Jul 30, 2023  8:07 AM Essie A wrote: Red Word that prompted transfer to Nurse Triage: Patient of NP Huenink called because she has been prescribed sertraline  (ZOLOFT ) 50 MG tablet.  The medication was changed to 50 mg from 25 mg.  She is having an issue with the higher dosage.  She is dizzy, lightheaded with tingling sensation in arms and chest. She is taking Tylenol  for headache. Reason for Disposition  [1] Caller has URGENT medicine question about med that primary care doctor (or NP/PA) or specialist prescribed AND [2] triager unable to answer question  Answer Assessment - Initial Assessment Questions 1. NAME of MEDICINE: What medicine(s) are you calling about?     Zoloft  2. QUESTION: What is your question? (e.g., double dose of medicine, side effect)     Side effects - dizzy,headache, no energy,not sleeping 3. PRESCRIBER: Who prescribed the medicine? Reason: if prescribed by specialist, call should be referred to that group.     pcp 4. SYMPTOMS: Do you have any symptoms? If Yes, ask: What symptoms are you having?  How bad are the symptoms (e.g., mild, moderate, severe)     yes 5. PREGNANCY:  Is there any chance that you are pregnant?  When was your last menstrual period?     no  Protocols used: Medication Question Call-A-AH

## 2023-07-31 ENCOUNTER — Encounter (HOSPITAL_COMMUNITY): Payer: Self-pay | Admitting: Internal Medicine

## 2023-07-31 ENCOUNTER — Ambulatory Visit: Payer: Self-pay

## 2023-07-31 LAB — SURGICAL PATHOLOGY

## 2023-07-31 NOTE — Telephone Encounter (Signed)
 FYI Only or Action Required?: Action required by provider: update on patient condition.  Patient was last seen in primary care on 07/27/2023 by [name], FNP.  Called Nurse Triage reporting Medication Problem.   Triage Disposition: Call PCP Now  Patient/caregiver understands and will follow disposition?: Yes           Copied from CRM [id number]. Topic: Clinical - Prescription Issue >> Jul 31, 2023  8:05 AM Cleave MATSU wrote: Reason for CRM: pt stated that the setraline 25mg  isnt helping her either she wants to know if she can try lexapro . Please follow up with pt. Reason for Disposition  [1] Caller has URGENT medicine question about med that primary care doctor (or NP/PA) or specialist prescribed AND [2] triager unable to answer question  Answer Assessment - Initial Assessment Questions 1. NAME of MEDICINE: What medicine(s) are you calling about?     Sertraline  50 mg did not agree with me (increased dose started last Friday) 2. QUESTION: What is your question? (e.g., double dose of medicine, side effect)     Wants to switch to lexapro  3. PRESCRIBER: Who prescribed the medicine? Reason: if prescribed by specialist, call should be referred to that group.     [name], FNP 4. SYMPTOMS: Do you have any symptoms? If Yes, ask: What symptoms are you having?  How bad are the symptoms (e.g., mild, moderate, severe)     On edge, couldn't sit down, fidgety and tingling/burning sensation in both arms and chest, affects sleep, wakes up every hour - when takes sertraline  25 mg    Took the sertraline  25 mg this morning  Swelling in feet/ tingling sensation in both feet (worse in left), swelling extends up to knee; pt wants a referral to a neurologist; pt states PCP is aware of this ongoing issue and was prescribed gabapentin   Protocols used: Medication Question Call-A-AH

## 2023-08-01 ENCOUNTER — Other Ambulatory Visit: Payer: Self-pay

## 2023-08-01 DIAGNOSIS — F419 Anxiety disorder, unspecified: Secondary | ICD-10-CM

## 2023-08-01 MED ORDER — ESCITALOPRAM OXALATE 10 MG PO TABS: 10.0000 mg | ORAL_TABLET | Freq: Every day | ORAL | 3 refills | Status: DC

## 2023-08-03 NOTE — Anesthesia Postprocedure Evaluation (Signed)
 Anesthesia Post Note  Patient: [name]  Procedure(s) Performed: COLONOSCOPY EGD (ESOPHAGOGASTRODUODENOSCOPY)  Patient location during evaluation: Phase II Anesthesia Type: General Level of consciousness: awake Pain management: pain level controlled Vital Signs Assessment: post-procedure vital signs reviewed and stable Respiratory status: spontaneous breathing and respiratory function stable Cardiovascular status: blood pressure returned to baseline and stable Postop Assessment: no headache and no apparent nausea or vomiting Anesthetic complications: no Comments: Late entry   No notable events documented.   Last Vitals:  Vitals:   07/30/23 1256 07/30/23 1300  BP: 94/63 103/65  Pulse: 71   Resp: 18   Temp: 36.4 C   SpO2: 100%     Last Pain:  Vitals:   07/31/23 1219  TempSrc:   PainSc: 0-No pain                 Yvonna JINNY Bosworth

## 2023-08-04 DIAGNOSIS — G629 Polyneuropathy, unspecified: Secondary | ICD-10-CM | POA: Insufficient documentation

## 2023-08-04 NOTE — Assessment & Plan Note (Signed)
 Add gabapentin  300 mg at night for symptoms of neuropathy.  Recommend follow-up if no improvement.

## 2023-08-04 NOTE — Assessment & Plan Note (Signed)
 She feels like she has had some improvement with the 25 mg of sertraline , but now not working as well.  Will increase sertraline  to 50 mg.  Advised to watch for worsening of anxiety and depression.  recommend follow-up in 3 months or sooner if needed

## 2023-08-07 ENCOUNTER — Ambulatory Visit: Admitting: Podiatry

## 2023-08-10 ENCOUNTER — Ambulatory Visit: Payer: Self-pay

## 2023-08-10 NOTE — Telephone Encounter (Signed)
 FYI Only or Action Required?: Action required by provider: referral request and update on patient condition.  Patient was last seen in primary care on 07/27/2023 by [name], FNP.  Called Nurse Triage reporting Headache and Medication Problem.  Symptoms began started gabapentin  on 07/27/23.  Interventions attempted: OTC medications: Tylenol .  Symptoms are: waking up with severe headaches (when taking gabapentin  at night), nausea, hot flashes, mild swelling in left leg, both leg veins look like they are sticking out more gradually improving.  Triage Disposition: Call PCP When Office is Open  Patient/caregiver understands and will follow disposition?: Yes            Copied from CRM #[id number]. Topic: Clinical - Red Word Triage >> Aug 10, 2023 10:43 AM Ivette P wrote: [facility] that prompted transfer to Nurse Triage: gabapentin  (NEURONTIN ) 300 MG capsule giving real bad headaches and wondering if another medications, and referral to neurologist. Reason for Disposition  [1] Caller has NON-URGENT medicine question about med that PCP prescribed AND [2] triager unable to answer question  Answer Assessment - Initial Assessment Questions Patient states her neuropathy is not doing any better on the gabapentin  and she feels like she is having adverse effects from the medication and would like something else prescribed. She is also requesting a referral to a neurology or vascular specialist. She states she called in several weeks ago and did not hear back.   1. NAME of MEDICINE: What medicine(s) are you calling about?     Gabapentin .  2. QUESTION: What is your question? (e.g., double dose of medicine, side effect)     Side effect, patient states she wakes up with headaches when she takes her gabapentin  at bedtime.  3. PRESCRIBER: Who prescribed the medicine? Reason: if prescribed by specialist, call should be referred to that group.     Doffing Bevely NP.  4. SYMPTOMS: Do you  have any symptoms? If Yes, ask: What symptoms are you having?  How bad are the symptoms (e.g., mild, moderate, severe)     8-9/10 headaches when she wakes up, she states she took Tylenol  and it is now a 1/10; nausea, hot flashes, mild swelling in left leg it's like my veins are more showing in both legs. Patient denies chest pain, SOB, changes in speech or vision, unilateral numbness or weakness, facial droop, neck stiffness, fever.  5. PREGNANCY:  Is there any chance that you are pregnant? When was your last menstrual period?     N/A.  Protocols used: Medication Question Call-A-AH

## 2023-08-14 ENCOUNTER — Telehealth: Payer: Self-pay

## 2023-08-14 ENCOUNTER — Other Ambulatory Visit: Payer: Self-pay

## 2023-08-14 ENCOUNTER — Ambulatory Visit: Admitting: Podiatry

## 2023-08-14 ENCOUNTER — Ambulatory Visit (INDEPENDENT_AMBULATORY_CARE_PROVIDER_SITE_OTHER)

## 2023-08-14 ENCOUNTER — Encounter: Payer: Self-pay | Admitting: Podiatry

## 2023-08-14 DIAGNOSIS — M19072 Primary osteoarthritis, left ankle and foot: Secondary | ICD-10-CM

## 2023-08-14 DIAGNOSIS — M76822 Posterior tibial tendinitis, left leg: Secondary | ICD-10-CM

## 2023-08-14 DIAGNOSIS — M19071 Primary osteoarthritis, right ankle and foot: Secondary | ICD-10-CM | POA: Diagnosis not present

## 2023-08-14 DIAGNOSIS — Q666 Other congenital valgus deformities of feet: Secondary | ICD-10-CM | POA: Diagnosis not present

## 2023-08-14 DIAGNOSIS — G5793 Unspecified mononeuropathy of bilateral lower limbs: Secondary | ICD-10-CM

## 2023-08-14 DIAGNOSIS — G6289 Other specified polyneuropathies: Secondary | ICD-10-CM

## 2023-08-14 DIAGNOSIS — M19079 Primary osteoarthritis, unspecified ankle and foot: Secondary | ICD-10-CM

## 2023-08-14 MED ORDER — MELOXICAM 15 MG PO TABS: 15.0000 mg | ORAL_TABLET | Freq: Every day | ORAL | 0 refills | Status: DC

## 2023-08-14 MED ORDER — TRIAMCINOLONE ACETONIDE 40 MG/ML IJ SUSP
20.0000 mg | Freq: Once | INTRAMUSCULAR | Status: AC
  Administered 2023-08-14 (×2): 20 mg

## 2023-08-14 MED ORDER — PREGABALIN 75 MG PO CAPS: 75.0000 mg | ORAL_CAPSULE | Freq: Two times a day (BID) | ORAL | 0 refills | Status: DC

## 2023-08-14 MED ORDER — METHYLPREDNISOLONE 4 MG PO TBPK
ORAL_TABLET | ORAL | 0 refills | Status: DC
Start: 2023-08-14 — End: 2023-09-24

## 2023-08-14 NOTE — Telephone Encounter (Signed)
 Called pt to inform her that [name] switched her gabapentin  to Lyrica  and sent that in to [facility].  Referral to neurology was also placed. And to ask was the referral for neuropathy or headaches?

## 2023-08-14 NOTE — Telephone Encounter (Signed)
 Called pt left voicemail to call back   Reason for call documented in her chart

## 2023-08-15 ENCOUNTER — Other Ambulatory Visit: Payer: Self-pay

## 2023-08-15 DIAGNOSIS — G6289 Other specified polyneuropathies: Secondary | ICD-10-CM

## 2023-08-15 DIAGNOSIS — G4452 New daily persistent headache (NDPH): Secondary | ICD-10-CM

## 2023-08-15 NOTE — Telephone Encounter (Signed)
 Pt states Headaches

## 2023-08-15 NOTE — Progress Notes (Signed)
 Subjective:  Patient ID: [name], female    DOB: 1960/03/05,  MRN: [id number] HPI Chief Complaint  Patient presents with   Foot Pain    Medial foot/dorsal midfoot bilateral (L>R) - tingling, numbness, burning x several months, worse at night, went to Urgent Care-xrayed, said had arthritis, no treatment, PCP did give gabapentin  to try, but made head hurt so bad in the morning couldn't tolerate it, thinks arches have fallen too, previous foot surgery   New Patient (Initial Visit)    63 y.o. female presents with the above complaint.   ROS: Denies fever chills nausea muscle aches pains calf pain back pain chest pain shortness of breath.  States that she had to stop taking the gabapentin  due to the headache that it would cause.  Past Medical History:  Diagnosis Date   Anxiety    Arthritis    Hypothyroidism    Past Surgical History:  Procedure Laterality Date   APPENDECTOMY     Bilateral bunionectomy     COLONOSCOPY N/A 01/01/2015   Procedure: COLONOSCOPY;  Surgeon: Margo LITTIE Haddock, MD;  [facility];  Service: Endoscopy;  Laterality: N/A;  11:15 AM - pt knows to arrive at 11:15   COLONOSCOPY N/A 07/30/2023   Procedure: COLONOSCOPY;  Surgeon: Cindie Carlin POUR, DO;  [facility]: [facility];  Service: Endoscopy;  Laterality: N/A;  130pm, asa 2   ESOPHAGOGASTRODUODENOSCOPY N/A 07/30/2023   Procedure: EGD (ESOPHAGOGASTRODUODENOSCOPY);  Surgeon: Cindie Carlin POUR, DO;  [facility]: [facility];  Service: Endoscopy;  Laterality: N/A;   LID LESION EXCISION Right 12/29/2020   Procedure: LID LESION EXCISION;  Surgeon: Jacques Sharper, MD;  Location: [facility];  Service: Ophthalmology;  Laterality: Right;  LOCAL   THYROIDECTOMY N/A 08/15/2013   Procedure: TOTAL THYROIDECTOMY;  Surgeon: Oneil DELENA Budge, MD;  Location: [facility];  Service: General;  Laterality: N/A;    Current Outpatient Medications:    acetaminophen  (TYLENOL ) 500 MG tablet, Take 500 mg by mouth every  6 (six) hours as needed., Disp: , Rfl:    escitalopram  (LEXAPRO ) 10 MG tablet, Take 1 tablet (10 mg total) by mouth daily., Disp: 30 tablet, Rfl: 3   levothyroxine  (SYNTHROID ) 88 MCG tablet, Take 1 tablet (88 mcg total) by mouth daily before breakfast., Disp: 90 tablet, Rfl: 3   meloxicam  (MOBIC ) 15 MG tablet, Take 1 tablet (15 mg total) by mouth daily., Disp: 30 tablet, Rfl: 0   methylPREDNISolone  (MEDROL  DOSEPAK) 4 MG TBPK tablet, 6 day dose pack - take as directed, Disp: 21 tablet, Rfl: 0   Multiple Vitamin (MULTIVITAMIN) tablet, Take 1 tablet by mouth daily., Disp: , Rfl:    rosuvastatin  (CRESTOR ) 20 MG tablet, TAKE ONE TABLET BY MOUTH ONCE DAILY., Disp: 90 tablet, Rfl: 1   pregabalin  (LYRICA ) 75 MG capsule, Take 1 capsule (75 mg total) by mouth 2 (two) times daily., Disp: 60 capsule, Rfl: 0  No Known Allergies Review of Systems Objective:  There were no vitals filed for this visit.  General: Well developed, nourished, in no acute distress, alert and oriented x3   Dermatological: Skin is warm, dry and supple bilateral. Nails x 10 are well maintained; remaining integument appears unremarkable at this time. There are no open sores, no preulcerative lesions, no rash or signs of infection present.  Vascular: Dorsalis Pedis artery and Posterior Tibial artery pedal pulses are 2/4 bilateral with immedate capillary fill time. Pedal hair growth present. No varicosities and no lower extremity edema present bilateral.  Neruologic: Grossly intact via light touch bilateral. Vibratory intact via tuning fork bilateral. Protective threshold with Semmes Wienstein monofilament intact to all pedal sites bilateral. Patellar and Achilles deep tendon reflexes 2+ bilateral. No Babinski or clonus noted bilateral.   Musculoskeletal: No gross boney pedal deformities bilateral. No pain, crepitus, or limitation noted with foot and ankle range of motion bilateral. Muscular strength 5/5 in all groups tested bilateral.   She has a severe pain on palpation of the posterior tibial tendon left there is fluctuance in the tendon sheath just beneath the medial malleolus.  She also has rather rigid pes planovalgus bilaterally with prominent medial navicular and tarsometatarsal joint arthritis.  Osteoarthritis is demonstrated by thickening of the bone and spurring dorsally and plantarly primarily the tarsometatarsal joints.  Gait: Unassisted, Nonantalgic.    Radiographs:  Radiographs taken today demonstrate osseously mature foot bilateral good bone mineralization severe osteoarthritis of the tarsometatarsal joints retained internal fixation some fragmented at the tarsometatarsal joints screw intact to the first metatarsal distally most likely capital osteotomy for bunion repair.  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  Plan: Discussed etiology pathology conservative versus surgical therapies at this point we injected the posterior tibial tendon area with 5 mg of Kenalog  making sure not to inject into the tendon itself.  I also started her on methylprednisolone .  Started her on meloxicam  to follow the methylprednisolone  and then Lyrica  twice daily and I will follow-up with her in 4 to 6 weeks to evaluate all of this.     Simmie Garin T. [place], [place]

## 2023-08-17 ENCOUNTER — Ambulatory Visit: Admitting: Cardiology

## 2023-08-17 NOTE — Progress Notes (Deleted)
 Clinical Summary [name] is a 63 y.o.female seen today as a new patient, referred for the following medical problems.  1.Chest pain - ER visit 05/2023 with chest pain, anxiety - trops neg, EKG NSR no ischemic changes. CXR no acute process     Past Medical History:  Diagnosis Date   Anxiety    Arthritis    Hypothyroidism      No Known Allergies   Current Outpatient Medications  Medication Sig Dispense Refill   acetaminophen  (TYLENOL ) 500 MG tablet Take 500 mg by mouth every 6 (six) hours as needed.     escitalopram  (LEXAPRO ) 10 MG tablet Take 1 tablet (10 mg total) by mouth daily. 30 tablet 3   levothyroxine  (SYNTHROID ) 88 MCG tablet Take 1 tablet (88 mcg total) by mouth daily before breakfast. 90 tablet 3   meloxicam  (MOBIC ) 15 MG tablet Take 1 tablet (15 mg total) by mouth daily. 30 tablet 0   methylPREDNISolone  (MEDROL  DOSEPAK) 4 MG TBPK tablet 6 day dose pack - take as directed 21 tablet 0   Multiple Vitamin (MULTIVITAMIN) tablet Take 1 tablet by mouth daily.     pregabalin  (LYRICA ) 75 MG capsule Take 1 capsule (75 mg total) by mouth 2 (two) times daily. 60 capsule 0   rosuvastatin  (CRESTOR ) 20 MG tablet TAKE ONE TABLET BY MOUTH ONCE DAILY. 90 tablet 1   No current facility-administered medications for this visit.     Past Surgical History:  Procedure Laterality Date   APPENDECTOMY     Bilateral bunionectomy     COLONOSCOPY N/A 01/01/2015   Procedure: COLONOSCOPY;  Surgeon: Margo LITTIE Haddock, MD;  [facility];  Service: Endoscopy;  Laterality: N/A;  11:15 AM - pt knows to arrive at 11:15   COLONOSCOPY N/A 07/30/2023   Procedure: COLONOSCOPY;  Surgeon: Cindie Carlin POUR, DO;  [facility];  Service: Endoscopy;  Laterality: N/A;  130pm, asa 2   ESOPHAGOGASTRODUODENOSCOPY N/A 07/30/2023   Procedure: EGD (ESOPHAGOGASTRODUODENOSCOPY);  Surgeon: Cindie Carlin POUR, DO;  [facility];  Service: Endoscopy;  Laterality: N/A;   LID LESION  EXCISION Right 12/29/2020   Procedure: LID LESION EXCISION;  Surgeon: Jacques Sharper, MD;  Location: [facility];  Service: Ophthalmology;  Laterality: Right;  LOCAL   THYROIDECTOMY N/A 08/15/2013   Procedure: TOTAL THYROIDECTOMY;  Surgeon: Oneil DELENA Budge, MD;  Location: [facility];  Service: General;  Laterality: N/A;     No Known Allergies    Family History  Problem Relation Age of Onset   Colon cancer Neg Hx    Inflammatory bowel disease Neg Hx      Social History Ms. Mcnamara reports that she has been smoking cigarettes. She has a 1.5 pack-year smoking history. She has been exposed to tobacco smoke. She has never used smokeless tobacco. Ms. Albertsen reports no history of alcohol use.   Review of Systems CONSTITUTIONAL: No weight loss, fever, chills, weakness or fatigue.  HEENT: Eyes: No visual loss, blurred vision, double vision or yellow sclerae.No hearing loss, sneezing, congestion, runny nose or sore throat.  SKIN: No rash or itching.  CARDIOVASCULAR:  RESPIRATORY: No shortness of breath, cough or sputum.  GASTROINTESTINAL: No anorexia, nausea, vomiting or diarrhea. No abdominal pain or blood.  GENITOURINARY: No burning on urination, no polyuria NEUROLOGICAL: No headache, dizziness, syncope, paralysis, ataxia, numbness or tingling in the extremities. No change in bowel or bladder control.  MUSCULOSKELETAL: No muscle, back pain, joint pain or  stiffness.  LYMPHATICS: No enlarged nodes. No history of splenectomy.  PSYCHIATRIC: No history of depression or anxiety.  ENDOCRINOLOGIC: No reports of sweating, cold or heat intolerance. No polyuria or polydipsia.  SABRA   Physical Examination There were no vitals filed for this visit. There were no vitals filed for this visit.  Gen: resting comfortably, no acute distress HEENT: no scleral icterus, pupils equal round and reactive, no palptable cervical adenopathy,  CV Resp: Clear to auscultation bilaterally GI: abdomen is  soft, non-tender, non-distended, normal bowel sounds, no hepatosplenomegaly MSK: extremities are warm, no edema.  Skin: warm, no rash Neuro:  no focal deficits Psych: appropriate affect   Diagnostic Studies     Assessment and Plan        Dorn PHEBE Ross, M.D., F.A.C.C.

## 2023-08-20 ENCOUNTER — Telehealth: Payer: Self-pay

## 2023-08-20 ENCOUNTER — Telehealth: Payer: Self-pay | Admitting: Podiatry

## 2023-08-20 NOTE — Telephone Encounter (Signed)
 Patient needs clarification on when she starts taking the meloxicam  and have been on for a week. Do she start the Lyrica  and continue to take meloxicam .

## 2023-08-20 NOTE — Telephone Encounter (Signed)
 Patient came into the office said she contacted [facility] neurology and there was no openings until 01.15.2026 patient needs another referral to another neurologist  to get a sooner appointment.

## 2023-08-21 ENCOUNTER — Telehealth: Payer: Self-pay | Admitting: Lab

## 2023-08-21 NOTE — Telephone Encounter (Signed)
 Patient is calling with concern on how to take medication stated she spoke with on call provider who advised her to start medication once steroids had been completed and she states Dr.Hyatt stated to wait 5 days and would like Dr.Hyatt to call her back.

## 2023-08-21 NOTE — Telephone Encounter (Signed)
 Spoke to patient advised.

## 2023-08-22 ENCOUNTER — Telehealth: Payer: Self-pay | Admitting: Neurology

## 2023-08-22 ENCOUNTER — Encounter: Payer: Self-pay | Admitting: Neurology

## 2023-08-22 ENCOUNTER — Other Ambulatory Visit: Payer: Self-pay

## 2023-08-22 DIAGNOSIS — G6289 Other specified polyneuropathies: Secondary | ICD-10-CM

## 2023-08-22 DIAGNOSIS — G4452 New daily persistent headache (NDPH): Secondary | ICD-10-CM

## 2023-08-22 NOTE — Telephone Encounter (Signed)
 Referral updated

## 2023-08-22 NOTE — Telephone Encounter (Signed)
 Request to cx, not needed

## 2023-08-27 ENCOUNTER — Telehealth: Payer: Self-pay

## 2023-08-27 DIAGNOSIS — M7918 Myalgia, other site: Secondary | ICD-10-CM | POA: Diagnosis not present

## 2023-08-27 DIAGNOSIS — M47812 Spondylosis without myelopathy or radiculopathy, cervical region: Secondary | ICD-10-CM | POA: Diagnosis not present

## 2023-08-27 DIAGNOSIS — M5412 Radiculopathy, cervical region: Secondary | ICD-10-CM | POA: Diagnosis not present

## 2023-08-27 DIAGNOSIS — M542 Cervicalgia: Secondary | ICD-10-CM | POA: Diagnosis not present

## 2023-08-27 NOTE — Telephone Encounter (Signed)
 Copied from CRM #[id number]. Topic: Clinical - Prescription Issue >> Aug 27, 2023  8:23 AM [name] wrote: Reason for CRM: Pt called and is reporting that pregabalin  is upsetting her stomach at this current dose. Wants to speak about lowering the MG.   Best contact: [phone]

## 2023-08-28 ENCOUNTER — Telehealth: Payer: Self-pay

## 2023-08-28 ENCOUNTER — Other Ambulatory Visit: Payer: Self-pay

## 2023-08-28 DIAGNOSIS — G6289 Other specified polyneuropathies: Secondary | ICD-10-CM

## 2023-08-28 MED ORDER — PREGABALIN 50 MG PO CAPS: 50.0000 mg | ORAL_CAPSULE | Freq: Two times a day (BID) | ORAL | 2 refills | Status: DC

## 2023-08-28 NOTE — Telephone Encounter (Signed)
 Copied from CRM #[id number]. Topic: Clinical - Prescription Issue >> Aug 27, 2023  8:23 AM [name] wrote: Reason for CRM: Pt called and is reporting that pregabalin  is upsetting her stomach at this current dose. Wants to speak about lowering the MG.   Best contact: [phone]

## 2023-08-30 DIAGNOSIS — M5412 Radiculopathy, cervical region: Secondary | ICD-10-CM | POA: Diagnosis not present

## 2023-09-04 ENCOUNTER — Ambulatory Visit: Payer: Self-pay | Admitting: Internal Medicine

## 2023-09-13 DIAGNOSIS — H5213 Myopia, bilateral: Secondary | ICD-10-CM | POA: Diagnosis not present

## 2023-09-17 ENCOUNTER — Other Ambulatory Visit: Payer: Self-pay | Admitting: Podiatry

## 2023-09-21 ENCOUNTER — Telehealth: Payer: Self-pay

## 2023-09-21 DIAGNOSIS — M7918 Myalgia, other site: Secondary | ICD-10-CM | POA: Diagnosis not present

## 2023-09-21 DIAGNOSIS — M5010 Cervical disc disorder with radiculopathy, unspecified cervical region: Secondary | ICD-10-CM

## 2023-09-21 DIAGNOSIS — M542 Cervicalgia: Secondary | ICD-10-CM | POA: Diagnosis not present

## 2023-09-21 DIAGNOSIS — M5412 Radiculopathy, cervical region: Secondary | ICD-10-CM | POA: Diagnosis not present

## 2023-09-21 DIAGNOSIS — M47812 Spondylosis without myelopathy or radiculopathy, cervical region: Secondary | ICD-10-CM | POA: Diagnosis not present

## 2023-09-21 NOTE — Progress Notes (Signed)
 Complex Care Management Note  Care Guide Note 09/21/2023 Name: YESLY GERETY MRN: [id number] DOB: 1960-06-14  PHENIX GREIN is a 63 y.o. year old female who sees [name], FNP for primary care. I reached out to Veva LITTIE Litter by phone today to offer complex care management services.  Ms. Albany was given information about Complex Care Management services today including:   The Complex Care Management services include support from the care team which includes your Nurse Care Manager, Clinical Social Worker, or Pharmacist.  The Complex Care Management team is here to help remove barriers to the health concerns and goals most important to you. Complex Care Management services are voluntary, and the patient may decline or stop services at any time by request to their care team member.   Complex Care Management Consent Status: Patient agreed to services and verbal consent obtained.   Follow up plan:  Telephone appointment with complex care management team member scheduled for:  09/26/23 @ 1 pm  Encounter Outcome:  Patient Scheduled  Leotis Rase [facility], [facility] Guide  Direct Dial: [phone]  Fax [phone]

## 2023-09-24 ENCOUNTER — Ambulatory Visit: Admitting: Gastroenterology

## 2023-09-24 ENCOUNTER — Encounter: Payer: Self-pay | Admitting: Gastroenterology

## 2023-09-24 VITALS — BP 98/64 | HR 75 | Temp 98.3°F | Ht 64.5 in | Wt 141.6 lb

## 2023-09-24 DIAGNOSIS — K5900 Constipation, unspecified: Secondary | ICD-10-CM | POA: Diagnosis not present

## 2023-09-24 DIAGNOSIS — K2970 Gastritis, unspecified, without bleeding: Secondary | ICD-10-CM | POA: Diagnosis not present

## 2023-09-24 DIAGNOSIS — R634 Abnormal weight loss: Secondary | ICD-10-CM

## 2023-09-24 DIAGNOSIS — R109 Unspecified abdominal pain: Secondary | ICD-10-CM

## 2023-09-24 DIAGNOSIS — Z860100 Personal history of colon polyps, unspecified: Secondary | ICD-10-CM

## 2023-09-24 DIAGNOSIS — R110 Nausea: Secondary | ICD-10-CM

## 2023-09-24 NOTE — Progress Notes (Signed)
 GI Office Note    Referring Provider: [name], FNP Primary Care Physician:  [name], FNP Primary Gastroenterologist: Carlin POUR. Cindie, DO  Date:  09/24/2023  ID:  [name], DOB February 09, 1960, MRN [id number]   Chief Complaint   Chief Complaint  Patient presents with   Follow-up    Doing well, no issues   History of Present Illness  [name] is a 63 y.o. female with a history of constipation, anxiety, and hypothyroidism presenting today with no reported complaints.  Colonoscopy December 2016: - Sigmoid polyp removed - Internal hemorrhoids - Pathology with tubular adenoma -Follow-up colonoscopy in 5-10 years  ED visit 07/02/2023 where she presented for nausea and worsening constipation over a month.  Had been taking stool softener with BM every 2-3 days and took MiraLAX  today she presented to the ED and she did have a large bowel movement but then had an episode of not feeling well afterwards along with left arm tingling and nausea which was resolved with Zofran .  Symptoms improved while on the way to the ED.  She had negative troponin x 2 and normal EKG.  CT A/P with contrast with no acute abnormalities.  Last office visit 07/19/23 with Josette Centers, PA.  She was previously on BuSpar  at the end of May for anxiety, on hydroxyzine  prior to this.  She states she took milk of magnesia and constipation improved initially but had ongoing trouble.  Not taking anything on a daily basis to help with bowel movements.  Previously has taken Colace, [place]' laxative, and MiraLAX  as needed.  Morning of visit she took a suppository.  Reportedly had a bowel movement daily at this point.  BuSpar  was switched to sertraline  which she feels like has helped some but bowel movements still not productive enough and having lots of gas.  Has had some toilet tissue hematochezia.  Is waking up overnight with stomach pain and will take nausea medication that helps.  Morning of visit did not  help much at all and she was having pain across her mid abdomen described as burning without any specific triggers.  No postprandial symptoms.  Also had a lack of appetite and some unintentional weight loss, reported about 20 pounds since January.  Denied any regular NSAIDs.  No family history of IBD or colon cancer.  Given her upper GI symptoms she was advised an upper endoscopy and was due for colon cancer surveillance therefore also scheduled for colonoscopy.  Advised to start MiraLAX  daily and pantoprazole  40 mg daily and to use Zofran  as needed for nausea.  Colonoscopy 07/30/23: - Non- bleeding internal hemorrhoids.  - Two 4 to 6 mm polyps in the ascending colon and in the cecum - The examination was otherwise normal. - Path: Tubular adenoma negative for high-grade dysplasia or malignancy. - Repeat colonoscopy in 7 years for surveillance.  EGD 07/30/23: - Z- line regular, 36 cm from the incisors.  - Gastritis. Biopsied.  - Normal duodenal bulb, first and second portion of the duodenum - Path: No specific pathologic diagnosis.  Negative H. pylori.  Today:  Discussed the use of AI scribe software for clinical note transcription with the patient, who gave verbal consent to proceed.  She has experienced improvement in her gastrointestinal symptoms, including nausea and constipation, since her last visit in July. Previously, she had nausea in the mornings and evenings, which has now resolved. Her constipation has also improved following her colonoscopy, and she now has regular bowel movements, sometimes  twice in the morning before breakfast, without straining, incomplete evacuation, or blood with wiping. Not taking any otc medications for this.  In July, she underwent an upper endoscopy and colonoscopy. Biopsies were benign, and no H. pylori was detected. She was started on Miralax  daily and pantoprazole  40 mg daily, with Zofran  as needed at her last office visit. However, she is not currently  taking pantoprazole  and has not needed zofran  since EGD. She also takes a multivitamin.  She experienced unintentional weight loss of about 20 pounds over six months, along with decreased appetite. Prior to starting Miralax , she was using a stool softener and Phillips laxative. Weight is stable since last OV - no further loss and no significant weight gain.  She is cautious about the use of meloxicam , which she was prescribed for arthritis in her foot, due to potential gastrointestinal side effects and her concurrent use of Lexapro . She prefers to use Tylenol  for pain relief. Goes to see foot doctor this week to determine if meloxicam  is going to be needed long term.   No current nausea, abdominal pain, lack of appetite, early satiety, nausea, vomiting, bloating, blood with wiping, or melena. She reports regular bowel movements.      Wt Readings from Last 5 Encounters:  09/24/23 141 lb 9.6 oz (64.2 kg)  07/27/23 140 lb 1.3 oz (63.5 kg)  07/19/23 139 lb 9.6 oz (63.3 kg)  07/12/23 142 lb 0.6 oz (64.4 kg)  07/02/23 145 lb (65.8 kg)    Current Outpatient Medications  Medication Sig Dispense Refill   acetaminophen  (TYLENOL ) 500 MG tablet Take 500 mg by mouth every 6 (six) hours as needed.     escitalopram  (LEXAPRO ) 10 MG tablet Take 1 tablet (10 mg total) by mouth daily. 30 tablet 3   levothyroxine  (SYNTHROID ) 88 MCG tablet Take 1 tablet (88 mcg total) by mouth daily before breakfast. 90 tablet 3   meloxicam  (MOBIC ) 15 MG tablet Take 1 tablet (15 mg total) by mouth daily. 30 tablet 0   Multiple Vitamin (MULTIVITAMIN) tablet Take 1 tablet by mouth daily.     pregabalin  (LYRICA ) 50 MG capsule Take 1 capsule (50 mg total) by mouth 2 (two) times daily. 60 capsule 2   rosuvastatin  (CRESTOR ) 20 MG tablet TAKE ONE TABLET BY MOUTH ONCE DAILY. 90 tablet 1   No current facility-administered medications for this visit.    Past Medical History:  Diagnosis Date   Anxiety    Arthritis     Hypothyroidism     Past Surgical History:  Procedure Laterality Date   APPENDECTOMY     Bilateral bunionectomy     COLONOSCOPY N/A 01/01/2015   Procedure: COLONOSCOPY;  Surgeon: Margo LITTIE Haddock, MD;  Location: [facility];  Service: Endoscopy;  Laterality: N/A;  11:15 AM - pt knows to arrive at 11:15   COLONOSCOPY N/A 07/30/2023   Procedure: COLONOSCOPY;  Surgeon: Cindie Carlin POUR, DO;  Location: [facility];  Service: Endoscopy;  Laterality: N/A;  130pm, asa 2   ESOPHAGOGASTRODUODENOSCOPY N/A 07/30/2023   Procedure: EGD (ESOPHAGOGASTRODUODENOSCOPY);  Surgeon: Cindie Carlin POUR, DO;  Location: [facility];  Service: Endoscopy;  Laterality: N/A;   LID LESION EXCISION Right 12/29/2020   Procedure: LID LESION EXCISION;  Surgeon: Jacques Sharper, MD;  Location: [facility];  Service: Ophthalmology;  Laterality: Right;  LOCAL   THYROIDECTOMY N/A 08/15/2013   Procedure: TOTAL THYROIDECTOMY;  Surgeon: Oneil DELENA Budge, MD;  Location: [facility];  Service: General;  Laterality:  N/A;    Family History  Problem Relation Age of Onset   Colon cancer Neg Hx    Inflammatory bowel disease Neg Hx     Allergies as of 09/24/2023   (No Known Allergies)    Social History   Socioeconomic History   Marital status: Single    Spouse name: Not on file   Number of children: Not on file   Years of education: Not on file   Highest education level: Not on file  Occupational History   Not on file  Tobacco Use   Smoking status: Every Day    Current packs/day: 0.10    Average packs/day: 0.1 packs/day for 15.0 years (1.5 ttl pk-yrs)    Types: Cigarettes    Passive exposure: Past   Smokeless tobacco: Never  Vaping Use   Vaping status: Never Used  Substance and Sexual Activity   Alcohol use: No   Drug use: No   Sexual activity: Yes    Birth control/protection: None  Other Topics Concern   Not on file  Social History Narrative   Not on file   Social Drivers of Health   Financial  Resource Strain: Low Risk  (05/06/2020)   Overall Financial Resource Strain (CARDIA)    Difficulty of Paying Living Expenses: Not hard at all  Food Insecurity: No Food Insecurity (05/06/2020)   Hunger Vital Sign    Worried About Running Out of Food in the Last Year: Never true    Ran Out of Food in the Last Year: Never true  Transportation Needs: No Transportation Needs (05/06/2020)   PRAPARE - Administrator, Civil Service (Medical): No    Lack of Transportation (Non-Medical): No  Physical Activity: Sufficiently Active (05/06/2020)   Exercise Vital Sign    Days of Exercise per Week: 5 days    Minutes of Exercise per Session: 30 min  Stress: No Stress Concern Present (05/06/2020)   [employer] of Occupational Health - Occupational Stress Questionnaire    Feeling of Stress : Not at all  Social Connections: Moderately Isolated (05/06/2020)   Social Connection and Isolation Panel    Frequency of Communication with Friends and Family: More than three times a week    Frequency of Social Gatherings with Friends and Family: More than three times a week    Attends Religious Services: Never    Database administrator or Organizations: No    Attends Banker Meetings: Never    Marital Status: Living with partner     Review of Systems   Gen: Denies fever, chills, anorexia. Denies fatigue, weakness, weight loss.  CV: Denies chest pain, palpitations, syncope, peripheral edema, and claudication. Resp: Denies dyspnea at rest, cough, wheezing, coughing up blood, and pleurisy. GI: See HPI Derm: Denies rash, itching, dry skin Psych: Denies depression, anxiety, memory loss, confusion. No homicidal or suicidal ideation.  Heme: Denies bruising, bleeding, and enlarged lymph nodes. + foot pain.   Physical Exam   BP 98/64 (BP Location: Right Arm, Patient Position: Sitting, Cuff Size: Normal)   Pulse 75   Temp 98.3 F (36.8 C) (Oral)   Ht 5' 4.5 (1.638 m)   Wt 141 lb 9.6 oz  (64.2 kg)   LMP 01/11/2013   SpO2 96%   BMI 23.93 kg/m   General:   Alert and oriented. No distress noted. Pleasant and cooperative.  Head:  Normocephalic and atraumatic. Eyes:  Conjuctiva clear without scleral icterus. Mouth:  Oral mucosa pink and  moist. Good dentition. No lesions. Abdomen:  +BS, soft, non-tender and non-distended. No rebound or guarding. No HSM or masses noted. Rectal: deferred Msk:  Symmetrical without gross deformities. Normal posture. Extremities:  Without edema. Neurologic:  Alert and  oriented x4 Psych:  Alert and cooperative. Normal mood and affect.  Assessment  [name] is a 63 y.o. female presenting today with no complaints.    Constipation, resolved Constipation has resolved following colonoscopy. Bowel movements have returned to normal frequency and consistency, with no straining or incomplete evacuation. No current abdominal pain or blood with wiping.  Gastric inflammation (gastritis), bloating and nausea Previous upper endoscopy in July showed mild gastric inflammation. Biopsies were benign with no H. pylori. Currently asymptomatic with no nausea, bloating, indigestion, or heartburn symptoms. Also no dysphagia. Not taking pantoprazole  as previously prescribed. Meloxicam  use could contribute to recurrent gastrointestinal symptoms of pain, nausea, and bloating. EGD findings discussed with her today in the office.  - Monitor for symptoms of nausea, bloating, indigestion, or heartburn. - Advised her discuss meloxicam  use with prescribing physician to determine if long-term use is necessary. If so, consider prophylactic acid reflux medication such as pepcid  or low dose pantoprazole .   Prior weight loss History of a 20-pound weight loss from January to July 2025.  EGD unremarkable other than gastritis with biopsies negative for H. pylori.  Colonoscopy unremarkable other than few colon polyps removed.  No issues with appetite any longer and weight remains  stable since July.   History of colonic polyps Recent colonoscopy revealed precancerous polyps, consistent with previous findings. No immediate concerns. No alarm symptoms present at this time. Findings reviewed with her today.  - Schedule follow-up colonoscopy in seven years.      PLAN   Follow up as needed.    Charmaine Melia, MSN, FNP-BC, AGACNP-BC [facility]

## 2023-09-24 NOTE — Patient Instructions (Addendum)
 Glad to hear you are doing well and are no longer having any symptoms of reflux or constipation.  Please discuss the use of meloxicam  with your orthopedic provider given your prior GI history.  If they want you to continue this long-term please let me know as at that point I would recommend low-dose medication for protection of your stomach such as Pepcid  or low-dose pantoprazole .  Please feel free to contact the office if you have any recurrent symptoms.  Follow-up as needed.  It was a pleasure to see you today. I want to create trusting relationships with patients. If you receive a survey regarding your visit,  I greatly appreciate you taking time to fill this out on paper or through your MyChart. I value your feedback.  Charmaine Melia, MSN, FNP-BC, AGACNP-BC [facility]

## 2023-09-25 ENCOUNTER — Encounter: Payer: Self-pay | Admitting: Podiatry

## 2023-09-25 ENCOUNTER — Ambulatory Visit: Admitting: Podiatry

## 2023-09-25 DIAGNOSIS — M19071 Primary osteoarthritis, right ankle and foot: Secondary | ICD-10-CM

## 2023-09-25 DIAGNOSIS — M19072 Primary osteoarthritis, left ankle and foot: Secondary | ICD-10-CM

## 2023-09-25 MED ORDER — CELECOXIB 200 MG PO CAPS: 200.0000 mg | ORAL_CAPSULE | Freq: Two times a day (BID) | ORAL | 3 refills | Status: AC

## 2023-09-25 NOTE — Progress Notes (Signed)
 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  Objective: Vital signs are stable she is alert and oriented x 3.  Pulses are palpable.  Much decrease in symptomatology.  Assessment: Resolving osteoarthritic pain.  Plan: Started her on Celebrex  200 mg capsules 1 twice daily if necessary.

## 2023-09-26 ENCOUNTER — Telehealth: Payer: Self-pay | Admitting: *Deleted

## 2023-09-26 ENCOUNTER — Other Ambulatory Visit: Payer: Self-pay

## 2023-09-26 NOTE — Telephone Encounter (Signed)
 Pt called and stated that she went to her podiatrist yesterday and they stopped her meloxicam  and started her on celebrex  200mg  1 twice daily

## 2023-09-26 NOTE — Telephone Encounter (Signed)
Spoke to pt, informed her of recommendations. She voiced understanding.  

## 2023-09-26 NOTE — Patient Instructions (Signed)
 Visit Information  [name] was given information about Medicaid Managed Care team care coordination services as a part of their Healthy [facility] Medicaid benefit. [name]   If you would like to schedule transportation through your Healthy [facility] plan, please call the following number at least 2 days in advance of your appointment: [phone]  For information about your ride after you set it up, call Ride Assist at [phone]. Use this number to activate a Will Call pickup, or if your transportation is late for a scheduled pickup. Use this number, too, if you need to make a change or cancel a previously scheduled reservation.  If you need transportation services right away, call [phone]. The after-hours call center is staffed 24 hours to handle ride assistance and urgent reservation requests (including discharges) 365 days a year. Urgent trips include sick visits, hospital discharge requests and life-sustaining treatment.  Call the [facility] Line at [phone], at any time, 24 hours a day, 7 days a week. If you are in danger or need immediate medical attention call 911.   Please see education materials related to cervical disc disorder and smoking cessation  provided by MyChart link.  Patient verbalizes understanding of instructions and care plan provided today and agrees to view in MyChart. Active MyChart status and patient understanding of how to access instructions and care plan via MyChart confirmed with patient.     Telephone follow up appointment with Managed Medicaid care management team member scheduled for: 10-15-2023 at 3:00 PM   Hendricks Her RN, BSN   I [facility]BC[facility]-Population Health RN Case Manager   Direct [phone]   Following is a copy of your plan of care:  There are no care plans that you recently modified to display for this patient.

## 2023-10-05 ENCOUNTER — Ambulatory Visit

## 2023-10-12 ENCOUNTER — Ambulatory Visit

## 2023-10-12 DIAGNOSIS — F419 Anxiety disorder, unspecified: Secondary | ICD-10-CM

## 2023-10-12 DIAGNOSIS — Z23 Encounter for immunization: Secondary | ICD-10-CM

## 2023-10-12 MED ORDER — ESCITALOPRAM OXALATE 5 MG PO TABS: 5.0000 mg | ORAL_TABLET | Freq: Every day | ORAL | 3 refills | Status: DC

## 2023-10-12 NOTE — Progress Notes (Signed)
 Established Patient Office Visit  Subjective   Patient ID: [name], female    DOB: 1960-01-17  Age: 63 y.o. MRN: [id number]  Chief Complaint  Patient presents with   Medical Management of Chronic Issues    Follow up    HPI Discussed the use of AI scribe software for clinical note transcription with the patient, who gave verbal consent to proceed.  History of Present Illness   [name] is a 63 year old female who presents for medication management and follow-up after an orthopedic consultation.  Cervical radiculopathy symptoms - Neck-related symptoms with sensations radiating to chest and arms - Symptoms attributed to cervical spine pathology by orthopedic specialist - Significant improvement in symptoms following cervical injection  Antidepressant medication management - Currently taking Lexapro  10 mg - Has not filled prescription recently - Considering reducing dose by splitting tablets if necessary  Blood pressure monitoring - Believes blood pressure has been normal recently - History of elevated blood pressure in the past      Patient Active Problem List   Diagnosis Date Noted   Peripheral neuropathy 08/04/2023   Dysesthesia affecting both sides of body 05/30/2023   Spondylosis of cervical region without myelopathy or radiculopathy 04/24/2023   Strain of cervical portion of left trapezius muscle 02/14/2023   Anxiety 02/14/2023   Adult hypothyroidism 02/07/2023   Benign essential HTN 02/07/2023   HLD (hyperlipidemia) 02/07/2023   Goiter, nontoxic, 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   Neck pain 02/02/2023   Cervical pain (neck) 01/31/2023   Tingling in extremities 01/31/2023   Encounter for examination following treatment at hospital 01/31/2023   Tobacco use 10/11/2021   Encounter to establish care 10/11/2021   Prediabetes 03/31/2019   Hyperlipidemia 09/23/2015   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  Constitutional:      Appearance: Normal appearance.  HENT:     Head: Normocephalic.  Eyes:     Extraocular Movements: Extraocular movements intact.     Pupils: Pupils are equal, round, and reactive to light.  Cardiovascular:     Rate and Rhythm: Normal rate and regular rhythm.  Pulmonary:     Effort: Pulmonary effort is normal.     Breath sounds: Normal breath sounds.  Musculoskeletal:     Cervical back: Normal range of motion and neck supple.  Neurological:     Mental Status: She is alert and oriented to person, place, and time.  Psychiatric:        Mood and Affect: Mood normal.        Thought Content: Thought content normal.    No results found for any visits on 10/12/23.    The 10-year ASCVD risk score (Arnett DK, et al., 2019) is: 4.8%    Assessment & Plan:   Problem  List Items Addressed This Visit       Other   Anxiety   Doing very well on the Lexapro , and is requesting to reduce the dose to 5 mg.  Will send in new prescription for Lexapro  5 mg.  Advised to watch for worsening of anxiety and/or depression.        Relevant Medications   escitalopram  (LEXAPRO ) 5 MG tablet   Other Relevant Orders   Flu vaccine trivalent PF, 6mos and older(Flulaval,Afluria,Fluarix,Fluzone) (Completed)    Return in about 4 months (around 02/12/2024).    Leita Longs, FNP

## 2023-10-12 NOTE — Progress Notes (Deleted)
 CARDIOLOGY CONSULT NOTE       Patient ID: [name] MRN: [id number] DOB/AGE: Apr 23, 1960 63 y.o.  Referring Physician: Bevely Primary Physician: [name], FNP Primary Cardiologist: New Reason for Consultation: Chest pain   HPI:  63 y.o. referred by FNP [facility] for chest pain. History of anxiety, arthritis and low thyroid . She has had some GI issues this year with unintentional weight loss , nausea and constipation EGD/colon benign Does use meloxicam  and celebrex  for arthritic pain in feet. She indicates smoking < 1/2 PPD. Takes lyrica  for neuropathy. She's been seen in ER multiple times with anxiety related symptoms including neck pain, nausea and paresthesias. She does have some cervical spine dx and sees Dr Burnetta in ortho for this At most when she is seen for anxiety she can have mild chest burning which is more likely related to GERD.   ***  ROS All other systems reviewed and negative except as noted above  Past Medical History:  Diagnosis Date   Anxiety    Arthritis    Hypothyroidism     Family History  Problem Relation Age of Onset   Colon cancer Neg Hx    Inflammatory bowel disease Neg Hx     Social History   Socioeconomic History   Marital status: Single    Spouse name: Not on file   Number of children: Not on file   Years of education: Not on file   Highest education level: Not on file  Occupational History   Not on file  Tobacco Use   Smoking status: Every Day    Current packs/day: 0.10    Average packs/day: 0.1 packs/day for 15.0 years (1.5 ttl pk-yrs)    Types: Cigarettes    Passive exposure: Past   Smokeless tobacco: Never  Vaping Use   Vaping status: Never Used  Substance and Sexual Activity   Alcohol use: No   Drug use: No   Sexual activity: Yes    Birth control/protection: None  Other Topics Concern   Not on file  Social History Narrative   Not on file   Social Drivers of Health   Financial Resource Strain: Low Risk  (05/06/2020)    Overall Financial Resource Strain (CARDIA)    Difficulty of Paying Living Expenses: Not hard at all  Food Insecurity: No Food Insecurity (09/26/2023)   Hunger Vital Sign    Worried About Running Out of Food in the Last Year: Never true    Ran Out of Food in the Last Year: Never true  Transportation Needs: No Transportation Needs (09/26/2023)   PRAPARE - Administrator, Civil Service (Medical): No    Lack of Transportation (Non-Medical): No  Physical Activity: Sufficiently Active (05/06/2020)   Exercise Vital Sign    Days of Exercise per Week: 5 days    Minutes of Exercise per Session: 30 min  Stress: No Stress Concern Present (05/06/2020)   [employer] of Occupational Health - Occupational Stress Questionnaire    Feeling of Stress : Not at all  Social Connections: Moderately Isolated (05/06/2020)   Social Connection and Isolation Panel    Frequency of Communication with Friends and Family: More than three times a week    Frequency of Social Gatherings with Friends and Family: More than three times a week    Attends Religious Services: Never    Database administrator or Organizations: No    Attends Banker Meetings: Never    Marital Status: Living with  partner  Intimate Partner Violence: Not At Risk (09/26/2023)   Humiliation, Afraid, Rape, and Kick questionnaire    Fear of Current or Ex-Partner: No    Emotionally Abused: No    Physically Abused: No    Sexually Abused: No    Past Surgical History:  Procedure Laterality Date   APPENDECTOMY     Bilateral bunionectomy     COLONOSCOPY N/A 01/01/2015   Procedure: COLONOSCOPY;  Surgeon: Margo LITTIE Haddock, MD;  [facility];  Service: Endoscopy;  Laterality: N/A;  11:15 AM - pt knows to arrive at 11:15   COLONOSCOPY N/A 07/30/2023   Procedure: COLONOSCOPY;  Surgeon: Cindie Carlin POUR, DO;  Location: [facility];  Service: Endoscopy;  Laterality: N/A;  130pm, asa 2   ESOPHAGOGASTRODUODENOSCOPY N/A  07/30/2023   Procedure: EGD (ESOPHAGOGASTRODUODENOSCOPY);  Surgeon: Cindie Carlin POUR, DO;  Location: [facility];  Service: Endoscopy;  Laterality: N/A;   LID LESION EXCISION Right 12/29/2020   Procedure: LID LESION EXCISION;  Surgeon: Jacques Sharper, MD;  Location: [facility];  Service: Ophthalmology;  Laterality: Right;  LOCAL   THYROIDECTOMY N/A 08/15/2013   Procedure: TOTAL THYROIDECTOMY;  Surgeon: Oneil DELENA Budge, MD;  Location: [facility];  Service: General;  Laterality: N/A;      Current Outpatient Medications:    acetaminophen  (TYLENOL ) 500 MG tablet, Take 500 mg by mouth every 6 (six) hours as needed., Disp: , Rfl:    celecoxib  (CELEBREX ) 200 MG capsule, Take 1 capsule (200 mg total) by mouth 2 (two) times daily., Disp: 60 capsule, Rfl: 3   escitalopram  (LEXAPRO ) 5 MG tablet, Take 1 tablet (5 mg total) by mouth daily., Disp: 30 tablet, Rfl: 3   famotidine  (PEPCID ) 20 MG tablet, Take 20 mg by mouth daily., Disp: , Rfl:    levothyroxine  (SYNTHROID ) 88 MCG tablet, Take 1 tablet (88 mcg total) by mouth daily before breakfast., Disp: 90 tablet, Rfl: 3   Multiple Vitamin (MULTIVITAMIN) tablet, Take 1 tablet by mouth daily., Disp: , Rfl:    pregabalin  (LYRICA ) 50 MG capsule, Take 1 capsule (50 mg total) by mouth 2 (two) times daily., Disp: 60 capsule, Rfl: 2   rosuvastatin  (CRESTOR ) 20 MG tablet, TAKE ONE TABLET BY MOUTH ONCE DAILY., Disp: 90 tablet, Rfl: 1    Physical Exam: Last menstrual period 01/11/2013.   Affect appropriate Healthy:  appears stated age HEENT: normal Neck supple with no adenopathy JVP normal no bruits no thyromegaly Lungs clear with no wheezing and good diaphragmatic motion Heart:  S1/S2 no murmur, no rub, gallop or click PMI normal Abdomen: benighn, BS positve, no tenderness, no AAA no bruit.  No HSM or HJR Distal pulses intact with no bruits No edema Neuro non-focal Skin warm and dry No muscular weakness   Labs:   Lab Results   Component Value Date   WBC 10.5 07/02/2023   HGB 15.4 (H) 07/02/2023   HCT 46.1 (H) 07/02/2023   MCV 90.6 07/02/2023   PLT 297 07/02/2023   No results for input(s): NA, K, CL, CO2, BUN, CREATININE, CALCIUM , PROT, BILITOT, ALKPHOS, ALT, AST, GLUCOSE in the last 168 hours.  Invalid input(s): LABALBU No results found for: CKTOTAL, CKMB, CKMBINDEX, TROPONINI  Lab Results  Component Value Date   CHOL 158 01/26/2023   CHOL 168 10/11/2021   CHOL 158 09/22/2019   Lab Results  Component Value Date   HDL 44 01/26/2023   HDL 41 10/11/2021   HDL 44 03/19/2019   Lab Results  Component Value Date   LDLCALC 73 01/26/2023   LDLCALC 82 10/11/2021   LDLCALC 85 09/22/2019   Lab Results  Component Value Date   TRIG 255 (H) 01/26/2023   TRIG 274 (H) 10/11/2021   TRIG 179 (A) 09/22/2019   Lab Results  Component Value Date   CHOLHDL 3.6 01/26/2023   CHOLHDL 4.1 10/11/2021   No results found for: LDLDIRECT    Radiology: No results found.  EKG: 07/02/23 SR rate 78 normal   ASSESSMENT AND PLAN:   *** GI:  f/u for constipation ? IBS GERD improved on pepcid  Neuropathy:  to see neurology now on Lyrica  Anxiety:  Rx with Lexapro  Arthtritis prefer Tylenol  for pain use Celebrex  sparingly with GI issues Thyroid :  on synthroid  replacement TSH 3.4 05/08/23   ***  F/U cardiology PRN ***  Signed: Maude Emmer 10/12/2023, 4:22 PM

## 2023-10-15 ENCOUNTER — Other Ambulatory Visit: Payer: Self-pay | Admitting: *Deleted

## 2023-10-15 NOTE — Patient Outreach (Signed)
 Complex Care Management   Visit Note  10/15/2023  Name:  [name] MRN: [id number] DOB: 05-Feb-1960  Situation: Referral received for Complex Care Management related to Cervical Disc Disorder I obtained verbal consent from Patient.  Visit completed with Patient  on the phone  Background:   Past Medical History:  Diagnosis Date   Anxiety    Arthritis    Hypothyroidism     Assessment: Patient Reported Symptoms:  Cognitive Cognitive Status: No symptoms reported Cognitive/Intellectual Conditions Management [RPT]: None reported or documented in medical history or problem list   Health Maintenance Behaviors: Annual physical exam Healing Pattern: Average Health Facilitated by: Rest  Neurological Neurological Review of Symptoms: No symptoms reported Neurological Management Strategies: Routine screening Neurological Self-Management Outcome: 4 (good)  HEENT HEENT Symptoms Reported: No symptoms reported HEENT Management Strategies: Routine screening HEENT Self-Management Outcome: 4 (good)    Cardiovascular Cardiovascular Symptoms Reported: No symptoms reported Does patient have uncontrolled Hypertension?: No Cardiovascular Management Strategies: Routine screening Cardiovascular Self-Management Outcome: 4 (good)  Respiratory Respiratory Symptoms Reported: No symptoms reported Respiratory Management Strategies: Routine screening Respiratory Self-Management Outcome: 4 (good)  Endocrine Endocrine Symptoms Reported: No symptoms reported Is patient diabetic?: No Endocrine Self-Management Outcome: 4 (good)  Gastrointestinal Gastrointestinal Symptoms Reported: No symptoms reported Gastrointestinal Self-Management Outcome: 4 (good)    Genitourinary Genitourinary Symptoms Reported: No symptoms reported Genitourinary Self-Management Outcome: 4 (good)  Integumentary Integumentary Symptoms Reported: No symptoms reported Skin Management Strategies: Routine screening Skin Self-Management  Outcome: 4 (good)  Musculoskeletal     Falls in the past year?: No Number of falls in past year: 1 or less Was there an injury with Fall?: No Fall Risk Category Calculator: 0 Patient Fall Risk Level: Low Fall Risk Patient at Risk for Falls Due to: No Fall Risks Fall risk Follow up: Falls evaluation completed  Psychosocial Psychosocial Symptoms Reported: No symptoms reported Behavioral Management Strategies: Support system Behavioral Health Self-Management Outcome: 4 (good) Major Change/Loss/Stressor/Fears (CP): Denies Techniques to Cope with Loss/Stress/Change: Spiritual practice(s) Quality of Family Relationships: helpful, involved, supportive Do you feel physically threatened by others?: No    10/15/2023    PHQ2-9 Depression Screening   Little interest or pleasure in doing things Not at all  Feeling down, depressed, or hopeless Not at all  PHQ-2 - Total Score 0  Trouble falling or staying asleep, or sleeping too much    Feeling tired or having little energy    Poor appetite or overeating     Feeling bad about yourself - or that you are a failure or have let yourself or your family down    Trouble concentrating on things, such as reading the newspaper or watching television    Moving or speaking so slowly that other people could have noticed.  Or the opposite - being so fidgety or restless that you have been moving around a lot more than usual    Thoughts that you would be better off dead, or hurting yourself in some way    PHQ2-9 Total Score    If you checked off any problems, how difficult have these problems made it for you to do your work, take care of things at home, or get along with other people    Depression Interventions/Treatment      There were no vitals filed for this visit.  Medications Reviewed Today     Reviewed by Bertrum Rosina HERO, RN (Registered Nurse) on 10/15/23 at 1506  Med List Status: <None>   Medication Order Taking?  Sig Documenting Provider Last  Dose Status Informant  acetaminophen  (TYLENOL ) 500 MG tablet [id number] Yes Take 500 mg by mouth every 6 (six) hours as needed. [provider]  Active   celecoxib  (CELEBREX ) 200 MG capsule [id number] Yes Take 1 capsule (200 mg total) by mouth 2 (two) times daily. Hyatt, Max T, [place]  Active   escitalopram  (LEXAPRO ) 5 MG tablet [id number] Yes Take 1 tablet (5 mg total) by mouth daily. [name], FNP  Active   famotidine  (PEPCID ) 20 MG tablet [id number] Yes Take 20 mg by mouth daily. [provider]  Active   levothyroxine  (SYNTHROID ) 88 MCG tablet [id number] Yes Take 1 tablet (88 mcg total) by mouth daily before breakfast. Therisa Benton PARAS, NP  Active   Multiple Vitamin (MULTIVITAMIN) tablet [id number] Yes Take 1 tablet by mouth daily. [provider]  Active   pregabalin  (LYRICA ) 50 MG capsule [id number] Yes Take 1 capsule (50 mg total) by mouth 2 (two) times daily. [name], FNP  Active   rosuvastatin  (CRESTOR ) 20 MG tablet [id number] Yes TAKE ONE TABLET BY MOUTH ONCE DAILY. Melvenia Manus BRAVO, MD  Active             Recommendation:   Continue Current Plan of Care  Follow Up Plan:   Telephone follow-up in 1 month  [name], BSN RN [facility], [facility] RN Care Manager Direct Dial: [phone]  Fax: [phone]

## 2023-10-15 NOTE — Patient Instructions (Signed)
 Visit Information  Ms. Yasin was given information about Medicaid Managed Care team care coordination services as a part of their Healthy [facility] Medicaid benefit. SUMIYA MAMARIL   If you would like to schedule transportation through your Healthy [facility] plan, please call the following number at least 2 days in advance of your appointment: [phone]  For information about your ride after you set it up, call Ride Assist at [phone]. Use this number to activate a Will Call pickup, or if your transportation is late for a scheduled pickup. Use this number, too, if you need to make a change or cancel a previously scheduled reservation.  If you need transportation services right away, call [phone]. The after-hours call center is staffed 24 hours to handle ride assistance and urgent reservation requests (including discharges) 365 days a year. Urgent trips include sick visits, hospital discharge requests and life-sustaining treatment.  Call the [facility] Line at [phone], at any time, 24 hours a day, 7 days a week. If you are in danger or need immediate medical attention call 911.   Please see education materials related to Smoking Cessation provided by MyChart link.  Patient verbalizes understanding of instructions and care plan provided today and agrees to view in MyChart. Active MyChart status and patient understanding of how to access instructions and care plan via MyChart confirmed with patient.     Telephone follow up appointment with Managed Medicaid care management team member scheduled for:11-15-2023 at 1:00 pm  [name], BSN RN [facility], [facility] RN Care Manager Direct Dial: [phone]  Fax: [phone]   Following is a copy of your plan of care:  There are no care plans that you recently modified to display for this patient.

## 2023-10-17 NOTE — Assessment & Plan Note (Signed)
 Doing very well on the Lexapro , and is requesting to reduce the dose to 5 mg.  Will send in new prescription for Lexapro  5 mg.  Advised to watch for worsening of anxiety and/or depression.

## 2023-10-19 ENCOUNTER — Ambulatory Visit: Admitting: Cardiovascular Disease

## 2023-10-24 LAB — HEMOGLOBIN A1C
Est. average glucose Bld gHb Est-mCnc: 120 mg/dL
Hgb A1c MFr Bld: 5.8 % — ABNORMAL HIGH (ref 4.8–5.6)

## 2023-10-24 LAB — COMPREHENSIVE METABOLIC PANEL WITH GFR
ALT: 19 IU/L (ref 0–32)
AST: 18 IU/L (ref 0–40)
Albumin: 4.3 g/dL (ref 3.9–4.9)
Alkaline Phosphatase: 66 IU/L (ref 49–135)
BUN/Creatinine Ratio: 23 (ref 12–28)
BUN: 15 mg/dL (ref 8–27)
Bilirubin Total: 0.3 mg/dL (ref 0.0–1.2)
CO2: 24 mmol/L (ref 20–29)
Calcium: 10.2 mg/dL (ref 8.7–10.3)
Chloride: 103 mmol/L (ref 96–106)
Creatinine, Ser: 0.64 mg/dL (ref 0.57–1.00)
Globulin, Total: 2.1 g/dL (ref 1.5–4.5)
Glucose: 65 mg/dL — ABNORMAL LOW (ref 70–99)
Potassium: 5.3 mmol/L — ABNORMAL HIGH (ref 3.5–5.2)
Sodium: 140 mmol/L (ref 134–144)
Total Protein: 6.4 g/dL (ref 6.0–8.5)
eGFR: 100 mL/min/1.73 (ref >59)

## 2023-10-24 LAB — TSH: TSH: 1.19 u[IU]/mL (ref 0.450–4.500)

## 2023-10-24 LAB — VITAMIN D 25 HYDROXY (VIT D DEFICIENCY, FRACTURES): Vit D, 25-Hydroxy: 53.5 ng/mL (ref 30.0–100.0)

## 2023-10-24 LAB — T4, FREE: Free T4: 1.59 ng/dL (ref 0.82–1.77)

## 2023-10-25 ENCOUNTER — Ambulatory Visit: Payer: Self-pay

## 2023-10-25 NOTE — Telephone Encounter (Signed)
 Patient called to follow up on report of labs from this morning Explained that the information has been forwarded to staff of L. Huenink, FNP and that they would follow up after Leita has reviewed the results.

## 2023-10-25 NOTE — Telephone Encounter (Signed)
  Reason for Disposition  [1] Follow-up call from patient regarding patient's clinical status AND [2] information NON-URGENT  Answer Assessment - Initial Assessment Questions 1. REASON FOR CALL or QUESTION: What is your reason for calling today? or How can I best     Patient called to inform PCP that endo advised her that her K is elevated and her BS is low.  Per 10/21 Labs: K 5.3 BS 65  Protocols used: PCP Call - No Triage-A-AH  Message from Turkey B sent at 10/25/2023 12:50 PM EDT  Reason for Triage: Patient states received labs from endocrinlogist and potassium is high and Blood sugar is low

## 2023-10-26 ENCOUNTER — Telehealth: Payer: Self-pay

## 2023-10-26 ENCOUNTER — Ambulatory Visit: Payer: Medicaid Other | Admitting: Nurse Practitioner

## 2023-10-26 NOTE — Telephone Encounter (Signed)
 Copied from CRM #[id number]. Topic: Clinical - Pink Word Triage >> Oct 25, 2023 12:48 PM Turkey B wrote: Paitent states received labs from endocrinlogist and potassium is high and Blood sugar is low

## 2023-10-26 NOTE — Telephone Encounter (Signed)
 She has an appointment with them on 10/28, so they can review labs at that time.

## 2023-10-29 NOTE — Telephone Encounter (Signed)
Noted will let pt know 

## 2023-10-30 ENCOUNTER — Ambulatory Visit: Admitting: Nurse Practitioner

## 2023-10-30 ENCOUNTER — Encounter: Payer: Self-pay | Admitting: Nurse Practitioner

## 2023-10-30 VITALS — BP 100/66 | HR 73 | Ht 64.5 in | Wt 145.6 lb

## 2023-10-30 DIAGNOSIS — E890 Postprocedural hypothyroidism: Secondary | ICD-10-CM

## 2023-10-30 DIAGNOSIS — R7303 Prediabetes: Secondary | ICD-10-CM | POA: Diagnosis not present

## 2023-10-30 DIAGNOSIS — E673 Hypervitaminosis D: Secondary | ICD-10-CM

## 2023-10-30 MED ORDER — LEVOTHYROXINE SODIUM 88 MCG PO TABS: 88.0000 ug | ORAL_TABLET | Freq: Every day | ORAL | 3 refills | Status: AC

## 2023-10-30 NOTE — Patient Instructions (Signed)

## 2023-10-30 NOTE — Progress Notes (Signed)
 10/30/2023   Endocrinology follow-up note   Subjective:    Patient ID: [name], female    DOB: 05-30-1960, PCP [name], FNP   Past Medical History:  Diagnosis Date   Anxiety    Arthritis    Hypothyroidism    Past Surgical History:  Procedure Laterality Date   APPENDECTOMY     Bilateral bunionectomy     COLONOSCOPY N/A 01/01/2015   Procedure: COLONOSCOPY;  Surgeon: Margo LITTIE Haddock, MD;  [facility]: [facility];  Service: Endoscopy;  Laterality: N/A;  11:15 AM - pt knows to arrive at 11:15   COLONOSCOPY N/A 07/30/2023   Procedure: COLONOSCOPY;  Surgeon: Cindie Carlin POUR, DO;  Location: [facility];  Service: Endoscopy;  Laterality: N/A;  130pm, asa 2   ESOPHAGOGASTRODUODENOSCOPY N/A 07/30/2023   Procedure: EGD (ESOPHAGOGASTRODUODENOSCOPY);  Surgeon: Cindie Carlin POUR, DO;  [facility]: [facility];  Service: Endoscopy;  Laterality: N/A;   LID LESION EXCISION Right 12/29/2020   Procedure: LID LESION EXCISION;  Surgeon: Jacques Sharper, MD;  Location: [facility];  Service: Ophthalmology;  Laterality: Right;  LOCAL   THYROIDECTOMY N/A 08/15/2013   Procedure: TOTAL THYROIDECTOMY;  Surgeon: Oneil DELENA Budge, MD;  Location: [facility];  Service: General;  Laterality: N/A;   Social History   Socioeconomic History   Marital status: Single    Spouse name: Not on file   Number of children: Not on file   Years of education: Not on file   Highest education level: Not on file  Occupational History   Not on file  Tobacco Use   Smoking status: Every Day    Current packs/day: 0.10    Average packs/day: 0.1 packs/day for 15.0 years (1.5 ttl pk-yrs)    Types: Cigarettes    Passive exposure: Past   Smokeless tobacco: Never  Vaping Use   Vaping status: Never Used  Substance and Sexual Activity   Alcohol use: No   Drug use: No   Sexual activity: Yes    Birth control/protection: None  Other Topics Concern   Not on file  Social History Narrative   Not on file    Social Drivers of Health   Financial Resource Strain: Low Risk  (05/06/2020)   Overall Financial Resource Strain (CARDIA)    Difficulty of Paying Living Expenses: Not hard at all  Food Insecurity: No Food Insecurity (09/26/2023)   Hunger Vital Sign    Worried About Running Out of Food in the Last Year: Never true    Ran Out of Food in the Last Year: Never true  Transportation Needs: No Transportation Needs (09/26/2023)   PRAPARE - Administrator, Civil Service (Medical): No    Lack of Transportation (Non-Medical): No  Physical Activity: Sufficiently Active (05/06/2020)   Exercise Vital Sign    Days of Exercise per Week: 5 days    Minutes of Exercise per Session: 30 min  Stress: No Stress Concern Present (05/06/2020)   [employer] of Occupational Health - Occupational Stress Questionnaire    Feeling of Stress : Not at all  Social Connections: Moderately Isolated (05/06/2020)   Social Connection and Isolation Panel    Frequency of Communication with Friends and Family: More than three times a week    Frequency of Social Gatherings with Friends and Family: More than three times a week    Attends Religious Services: Never    Database Administrator or Organizations: No    Attends Banker  Meetings: Never    Marital Status: Living with partner   Outpatient Encounter Medications as of 10/30/2023  Medication Sig   acetaminophen  (TYLENOL ) 500 MG tablet Take 500 mg by mouth every 6 (six) hours as needed.   celecoxib  (CELEBREX ) 200 MG capsule Take 1 capsule (200 mg total) by mouth 2 (two) times daily.   escitalopram  (LEXAPRO ) 5 MG tablet Take 1 tablet (5 mg total) by mouth daily.   famotidine  (PEPCID ) 20 MG tablet Take 20 mg by mouth daily.   Multiple Vitamin (MULTIVITAMIN) tablet Take 1 tablet by mouth daily.   pregabalin  (LYRICA ) 50 MG capsule Take 1 capsule (50 mg total) by mouth 2 (two) times daily.   rosuvastatin  (CRESTOR ) 20 MG tablet TAKE ONE TABLET BY MOUTH  ONCE DAILY.   [DISCONTINUED] levothyroxine  (SYNTHROID ) 88 MCG tablet Take 1 tablet (88 mcg total) by mouth daily before breakfast.   levothyroxine  (SYNTHROID ) 88 MCG tablet Take 1 tablet (88 mcg total) by mouth daily before breakfast.   No facility-administered encounter medications on file as of 10/30/2023.   ALLERGIES: No Known Allergies VACCINATION STATUS: Immunization History  Administered Date(s) Administered   Influenza Inj Mdck Quad With Preservative 10/25/2016   Influenza, Seasonal, Injecte, Preservative Fre 10/12/2023   Influenza,inj,Quad PF,6+ Mos 10/11/2021, 11/22/2022   Influenza,inj,quad, With Preservative 10/25/2017   Tdap 09/24/2019    Thyroid  Problem Presents for follow-up visit. Patient reports no anxiety, cold intolerance, constipation, depressed mood, diarrhea, dry skin, fatigue, hair loss, heat intolerance, leg swelling, palpitations, tremors, weight gain or weight loss. The symptoms have been stable.     63 year old female  with medical history significant for multinodular goiter status post prior radioactive iodine  ablation, and completion thyroidectomy on 08/15/13 with benign outcomes. She is currently on Levothyroxine  88 mcg p.o. daily before breakfast for postsurgical hypothyroidism.  She is compliant to her medications.  She has been stable on this medication for quite some time now.  She has no new complaints today.   -She denies cold/hot intolerance. -She has a steady energy level.  She has family hx of thyroid  nodules, but no family hx of thyroid  cancer.   Review of systems  Constitutional: + Minimally fluctuating body weight,  current Body mass index is 24.61 kg/m. , no fatigue, no subjective hyperthermia, no subjective hypothermia Eyes: no blurry vision, no xerophthalmia ENT: no sore throat, no nodules palpated in throat, no dysphagia/odynophagia, no hoarseness Cardiovascular: no chest pain, no shortness of breath, no palpitations, no leg  swelling Respiratory: no cough, no shortness of breath Gastrointestinal: no nausea/vomiting/diarrhea Musculoskeletal: no muscle/joint aches Skin: no rashes, no hyperemia Neurological: no tremors, no numbness, no tingling, no dizziness Psychiatric: no depression, no anxiety  Objective:    BP 100/66 (BP Location: Left Arm, Patient Position: Sitting, Cuff Size: Large)   Pulse 73   Ht 5' 4.5 (1.638 m)   Wt 145 lb 9.6 oz (66 kg)   LMP 01/11/2013   BMI 24.61 kg/m   Wt Readings from Last 3 Encounters:  10/30/23 145 lb 9.6 oz (66 kg)  10/12/23 143 lb 1.9 oz (64.9 kg)  09/26/23 141 lb (64 kg)    BP Readings from Last 3 Encounters:  10/30/23 100/66  10/12/23 103/70  09/24/23 98/64     Physical Exam- Limited  Constitutional:  Body mass index is 24.61 kg/m. , not in acute distress, normal state of mind Eyes:  EOMI, no exophthalmos Musculoskeletal: no gross deformities, strength intact in all four extremities, no gross restriction of joint movements  Skin:  no rashes, no hyperemia Neurological: no tremor with outstretched hands     CMP     Component Value Date/Time   NA 140 10/23/2023 1042   K 5.3 (H) 10/23/2023 1042   CL 103 10/23/2023 1042   CO2 24 10/23/2023 1042   GLUCOSE 65 (L) 10/23/2023 1042   GLUCOSE 106 (H) 07/02/2023 0900   BUN 15 10/23/2023 1042   CREATININE 0.64 10/23/2023 1042   CALCIUM  10.2 10/23/2023 1042   PROT 6.4 10/23/2023 1042   ALBUMIN 4.3 10/23/2023 1042   AST 18 10/23/2023 1042   ALT 19 10/23/2023 1042   ALKPHOS 66 10/23/2023 1042   BILITOT 0.3 10/23/2023 1042   GFRNONAA >60 07/02/2023 0900   GFRAA >90 08/16/2013 0547   Recent Results (from the past 2160 hours)  T4, free     Status: None   Collection Time: 10/23/23 10:42 AM  Result Value Ref Range   Free T4 1.59 0.82 - 1.77 ng/dL  TSH     Status: None   Collection Time: 10/23/23 10:42 AM  Result Value Ref Range   TSH 1.190 0.450 - 4.500 uIU/mL  Comprehensive metabolic panel     Status:  Abnormal   Collection Time: 10/23/23 10:42 AM  Result Value Ref Range   Glucose 65 (L) 70 - 99 mg/dL   BUN 15 8 - 27 mg/dL   Creatinine, Ser 9.35 0.57 - 1.00 mg/dL   eGFR 899 >40 fO/fpw/8.26   BUN/Creatinine Ratio 23 12 - 28   Sodium 140 134 - 144 mmol/L   Potassium 5.3 (H) 3.5 - 5.2 mmol/L   Chloride 103 96 - 106 mmol/L   CO2 24 20 - 29 mmol/L   Calcium  10.2 8.7 - 10.3 mg/dL   Total Protein 6.4 6.0 - 8.5 g/dL   Albumin 4.3 3.9 - 4.9 g/dL   Globulin, Total 2.1 1.5 - 4.5 g/dL   Bilirubin Total 0.3 0.0 - 1.2 mg/dL   Alkaline Phosphatase 66 49 - 135 IU/L   AST 18 0 - 40 IU/L   ALT 19 0 - 32 IU/L  Hemoglobin A1c     Status: Abnormal   Collection Time: 10/23/23 10:42 AM  Result Value Ref Range   Hgb A1c MFr Bld 5.8 (H) 4.8 - 5.6 %    Comment:          Prediabetes: 5.7 - 6.4          Diabetes: >6.4          Glycemic control for adults with diabetes: <7.0    Est. average glucose Bld gHb Est-mCnc 120 mg/dL  VITAMIN D  25 Hydroxy (Vit-D Deficiency, Fractures)     Status: None   Collection Time: 10/23/23 10:42 AM  Result Value Ref Range   Vit D, 25-Hydroxy 53.5 30.0 - 100.0 ng/mL    Comment: Vitamin D  deficiency has been defined by the Institute of Medicine and an Endocrine Society practice guideline as a level of serum 25-OH vitamin D  less than 20 ng/mL (1,2). The Endocrine Society went on to further define vitamin D  insufficiency as a level between 21 and 29 ng/mL (2). 1. IOM (Institute of Medicine). 2010. Dietary reference    intakes for calcium  and D. [place]: The    [employer]. 2. Holick MF, Binkley [place], Bischoff-Ferrari HA, et al.    Evaluation, treatment, and prevention of vitamin D     deficiency: an Endocrine Society clinical practice    guideline. JCEM. 2011 Jul; 96(7):1911-30.  Latest Reference Range & Units 10/06/22 11:17 02/03/23 09:25 05/08/23 08:13 10/23/23 10:42  TSH 0.450 - 4.500 uIU/mL 3.210 5.348 (H) 3.441 1.190  T4,Free(Direct) 0.82 -  1.77 ng/dL 8.54 8.82 (H)  8.40  (H): Data is abnormally high  Assessment & Plan:   1. Postsurgical hypothyroidism -s/p total thyroidectomy Her previsit thyroid  function tests are consistent with appropriate replacement.  She is advised to continue Levothyroxine  88 mcg p.o. daily before breakfast. Will recheck TFTs prior to next visit in 1 year.    - We discussed about the correct intake of her thyroid  hormone, on empty stomach at fasting, with water , separated by at least 30 minutes from breakfast and other medications,  and separated by more than 4 hours from calcium , iron, multivitamins, acid reflux medications (PPIs). -Patient is made aware of the fact that thyroid  hormone replacement is needed for life, dose to be adjusted by periodic monitoring of thyroid  function tests.  2. Prediabetes Her previsit A1C is 5.8%, essentially the same as last year, improved somewhat.  Still in the prediabetes category.  She does not need medication at this time.  She is encouraged to continue to work on lifestyle modifications.  She did have low glucose on CMP, but she had eaten cereal that morning.  We did talk about reactive hypoglycemia and normal glucose ranges for patients who do not have diabetes.  - Nutritional counseling repeated at each appointment due to patients tendency to fall back in to old habits.  - The patient admits there is a room for improvement in their diet and drink choices. -  Suggestion is made for the patient to avoid simple carbohydrates from their diet including Cakes, Sweet Desserts / Pastries, Ice Cream, Soda (diet and regular), Sweet Tea, Candies, Chips, Cookies, Sweet Pastries, Store Bought Juices, Alcohol in Excess of 1-2 drinks a day, Artificial Sweeteners, Coffee Creamer, and Sugar-free Products. This will help patient to have stable blood glucose profile and potentially avoid unintended weight gain.   - I encouraged the patient to switch to unprocessed or minimally  processed complex starch and increased protein intake (animal or plant source), fruits, and vegetables.   - Patient is advised to stick to a routine mealtimes to eat 3 meals a day and avoid unnecessary snacks (to snack only to correct hypoglycemia).  3. Hypervitaminosis D Her repeat vitamin D  level on 10/23/23 was normal at 53.5. She is taking MVI with Vitamin D  in it, advised to continue.  Will recheck in a year.    I am having her repeat her CMP in 2 months due to high potassium level to recheck.   - I advised patient to maintain close follow up with [name], FNP for primary care needs.    I spent  26  minutes in the care of the patient today including review of labs from Thyroid  Function, CMP, and other relevant labs ; imaging/biopsy records (current and previous including abstractions from other facilities); face-to-face time discussing  her lab results and symptoms, medications doses, her options of short and long term treatment based on the latest standards of care / guidelines;   and documenting the encounter.  Veva CROME Branch  participated in the discussions, expressed understanding, and voiced agreement with the above plans.  All questions were answered to her satisfaction. she is encouraged to contact clinic should she have any questions or concerns prior to her return visit.   Follow up plan: Return in about 1 year (around 10/29/2024) for Thyroid  follow  up, Previsit labs.  Benton Rio, [facility] [address] [place], [place] [address] Phone: [phone] Fax: [phone]  10/30/2023, 2:39 PM

## 2023-11-07 NOTE — Progress Notes (Deleted)
 Initial neurology clinic note  Reason for Evaluation: Consultation requested by [name], FNP for an opinion regarding ***. My final recommendations will be communicated back to the requesting physician by way of shared medical record or letter to requesting physician via [place]  mail.  HPI: This is Ms. [name], a 62 y.o. ***-handed female with a medical history of pre-DM, HLD, hypothyroidism, OA, anxiety*** who presents to neurology clinic with the chief complaint of ***. The patient is accompanied by ***.  *** Has pain in her feet (L > R) with tingling, numbness, and burning Was given gabapentin  by PCP but made her head hurt so she stopped it Switched to Lyrica  - was upsetting her stomach so dose lowered to 50 mg BID Had foot surgery in the past? 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  Lexapro  was recently started for anxiety   The patient has not*** had similar episodes of symptoms in the past. ***  Muscle bulk loss? *** Muscle pain? ***  Cramps/Twitching? *** Suggestion of myotonia/difficulty relaxing after contraction? ***  Fatigable weakness?*** Does strength improve after brief exercise?***  Able to brush hair/teeth without difficulty? *** Able to button shirts/use zips? *** Clumsiness/dropping grasped objects?*** Can you arise from squatted position easily? *** Able to get out of chair without using arms? *** Able to walk up steps easily? *** Use an assistive device to walk? *** Significant imbalance with walking? *** Falls?*** Any change in urine color, especially after exertion/physical activity? ***  The patient denies*** symptoms suggestive of  oculobulbar weakness including diplopia, ptosis, dysphagia, poor saliva control, dysarthria/dysphonia, impaired mastication, facial weakness/droop.  There are no*** neuromuscular respiratory weakness symptoms, particularly orthopnea>dyspnea.   Pseudobulbar affect is absent***.  The patient does not*** report symptoms referable to autonomic dysfunction including impaired sweating, heat or cold intolerance, excessive mucosal dryness, gastroparetic early satiety, postprandial abdominal bloating, constipation, bowel or bladder dyscontrol, erectile dysfunction*** or syncope/presyncope/orthostatic intolerance.  There are no*** complaints relating to other symptoms of small fiber modalities including paresthesia/pain.  The patient has not *** noticed any recent skin rashes nor does he*** report any constitutional symptoms like fever, night sweats, anorexia or unintentional weight loss.  EtOH use: ***  Restrictive diet? *** Family history of neuropathy/myopathy/NM disease?***  Previous labs, electrodiagnostics, and neuroimaging are summarized below, but pertinent findings include***  Any biopsy done? *** Current medications being tried for the patient's symptoms include ***  Prior medications that have been tried: ***   MEDICATIONS:  Outpatient Encounter Medications as of 11/15/2023  Medication Sig   acetaminophen  (TYLENOL ) 500 MG tablet Take 500 mg by mouth every 6 (six) hours as needed.   celecoxib  (CELEBREX ) 200 MG capsule Take 1 capsule (200 mg total) by mouth 2 (two) times daily.   escitalopram  (LEXAPRO ) 5 MG tablet Take 1 tablet (5 mg total) by mouth daily.   famotidine  (PEPCID ) 20 MG tablet Take 20 mg by mouth daily.   levothyroxine  (SYNTHROID ) 88 MCG tablet Take 1 tablet (88 mcg total) by mouth daily before breakfast.   Multiple Vitamin (MULTIVITAMIN) tablet Take 1 tablet by mouth daily.   pregabalin  (LYRICA ) 50 MG capsule Take 1 capsule (50 mg total) by mouth 2 (two) times daily.    rosuvastatin  (CRESTOR ) 20  MG tablet TAKE ONE TABLET BY MOUTH ONCE DAILY.   No facility-administered encounter medications on file as of 11/15/2023.    PAST MEDICAL HISTORY: Past Medical History:  Diagnosis Date   Anxiety    Arthritis    Hypothyroidism     PAST SURGICAL HISTORY: Past Surgical History:  Procedure Laterality Date   APPENDECTOMY     Bilateral bunionectomy     COLONOSCOPY N/A 01/01/2015   Procedure: COLONOSCOPY;  Surgeon: Margo LITTIE Haddock, MD;  [facility];  Service: Endoscopy;  Laterality: N/A;  11:15 AM - pt knows to arrive at 11:15   COLONOSCOPY N/A 07/30/2023   Procedure: COLONOSCOPY;  Surgeon: Cindie Carlin POUR, DO;  [facility];  Service: Endoscopy;  Laterality: N/A;  130pm, asa 2   ESOPHAGOGASTRODUODENOSCOPY N/A 07/30/2023   Procedure: EGD (ESOPHAGOGASTRODUODENOSCOPY);  Surgeon: Cindie Carlin POUR, DO;  [facility];  Service: Endoscopy;  Laterality: N/A;   LID LESION EXCISION Right 12/29/2020   Procedure: LID LESION EXCISION;  Surgeon: Jacques Sharper, MD;  Location: [facility];  Service: Ophthalmology;  Laterality: Right;  LOCAL   THYROIDECTOMY N/A 08/15/2013   Procedure: TOTAL THYROIDECTOMY;  Surgeon: Oneil DELENA Budge, MD;  Location: [facility];  Service: General;  Laterality: N/A;    ALLERGIES: No Known Allergies  FAMILY HISTORY: Family History  Problem Relation Age of Onset   Colon cancer Neg Hx    Inflammatory bowel disease Neg Hx     SOCIAL HISTORY: Social History   Tobacco Use   Smoking status: Every Day    Current packs/day: 0.10    Average packs/day: 0.1 packs/day for 15.0 years (1.5 ttl pk-yrs)    Types: Cigarettes    Passive exposure: Past   Smokeless tobacco: Never  Vaping Use   Vaping status: Never Used  Substance Use Topics   Alcohol use: No   Drug use: No   Social History   Social History Narrative   Not on file     OBJECTIVE: PHYSICAL EXAM: LMP 01/11/2013    General:*** General appearance: Awake and alert. No distress. Cooperative with exam.  Skin: No obvious rash or jaundice. HEENT: Atraumatic. Anicteric. Lungs: Non-labored breathing on room air  Heart: Regular Abdomen: Soft, non tender. Extremities: No edema. No obvious deformity.  Musculoskeletal: No obvious joint swelling. Psych: Affect appropriate.  Neurological: Mental Status: Alert. Speech fluent. No pseudobulbar affect Cranial Nerves: CNII: No RAPD. Visual fields grossly intact. CNIII, IV, VI: PERRL. No nystagmus. EOMI. CN V: Facial sensation intact bilaterally to fine touch. [place] clench strong. Jaw jerk***. CN VII: Facial muscles symmetric and strong. No ptosis at rest or after sustained upgaze***. CN VIII: Hearing grossly intact bilaterally. CN IX: No hypophonia. CN X: Palate elevates symmetrically. CN XI: Full strength shoulder shrug bilaterally. CN XII: Tongue protrusion full and midline. No atrophy or fasciculations. No significant dysarthria*** Motor: Tone is ***. *** fasciculations in *** extremities. *** atrophy. No grip or percussive myotonia.***  Individual muscle group testing (MRC grade out of 5):  Movement     Neck flexion ***    Neck extension ***     Right Left   Shoulder abduction *** ***   Shoulder adduction *** ***   Shoulder ext rotation *** ***   Shoulder int rotation *** ***   Elbow flexion *** ***   Elbow extension *** ***   Wrist extension *** ***   Wrist flexion *** ***   Finger abduction - FDI *** ***  Finger abduction - ADM *** ***   Finger extension *** ***   Finger distal flexion - 2/3 *** ***   Finger distal flexion - 4/5 *** ***   Thumb flexion - FPL *** ***   Thumb abduction - APB *** ***    Hip flexion *** ***   Hip extension *** ***   Hip adduction *** ***   Hip abduction *** ***   Knee extension *** ***   Knee flexion *** ***   Dorsiflexion *** ***   Plantarflexion *** ***   Inversion *** ***   Eversion ***  ***   Great toe extension *** ***   Great toe flexion *** ***     Reflexes:  Right Left   Bicep *** ***   Tricep *** ***   BrRad *** ***   Knee *** ***   Ankle *** ***    Pathological Reflexes: Babinski: *** response bilaterally*** Hoffman: *** Troemner: *** Pectoral: *** Palmomental: *** Facial: *** Midline tap: *** Sensation: Pinprick: *** Vibration: *** Temperature: *** Proprioception: *** Coordination: Intact finger-to- nose-finger bilaterally. Romberg negative.*** Gait: Able to rise from chair with arms crossed unassisted. Normal, narrow-based gait. Able to tandem walk. Able to walk on toes and heels.***  Lab and Test Review: Internal labs: 10/23/23: Vit D wnl HbA1c: 5.8 CMP significant for K 5.3 TSH wnl  02/07/23: Folate > 20 Ferritin 160  External labs: ***  Imaging/Procedures: MRI brain w/wo contrast (11/12/2003): Unremarkable MRI of the brain.  Small incidental venous angioma, right frontal lobe.  This represents a benign finding and represents a variant of venous drainage/disordered venous drainage.  Venous angiomas are usually incidental findings and only rarely are associated with hemorrhage or symptoms.  Pansinusitis.   Cervical spine xray (02/02/23): IMPRESSION: 1. Multilevel degenerative changes of the cervical spine most pronounced at C6-7 where there is moderate intervertebral disc space height loss and trace retrolisthesis. 2. Severe RIGHT osseous neuroforaminal narrowing at C3-4 and C4-5. ***  ASSESSMENT: ILZE ROSELLI is a 63 y.o. female who presents for evaluation of ***. *** has a relevant medical history of ***. *** neurological examination is pertinent for ***. Available diagnostic data is significant for ***. This constellation of symptoms and objective data would most likely localize to ***. ***  PLAN: -Blood work: *** ***  -Return to clinic ***  The impression above as well as the plan as outlined below were extensively  discussed with the patient (in the company of ***) who voiced understanding. All questions were answered to their satisfaction.  The patient was counseled on pertinent fall precautions per the printed material provided today, and as noted under the Patient Instructions section below.***  When available, results of the above investigations and possible further recommendations will be communicated to the patient via telephone/MyChart. Patient to call office if not contacted after expected testing turnaround time.   Total time spent reviewing records, interview, history/exam, documentation, and coordination of care on day of encounter:  *** min   Thank you for allowing me to participate in patient's care.  If I can answer any additional questions, I would be pleased to do so.  Venetia Potters, MD   CC: [name], FNP [address] [place] [address]  CC: Referring provider: [name], FNP [phone] [place],  [place] [address]

## 2023-11-11 ENCOUNTER — Other Ambulatory Visit: Payer: Self-pay

## 2023-11-11 DIAGNOSIS — G6289 Other specified polyneuropathies: Secondary | ICD-10-CM

## 2023-11-15 ENCOUNTER — Other Ambulatory Visit: Payer: Self-pay | Admitting: *Deleted

## 2023-11-15 ENCOUNTER — Ambulatory Visit: Admitting: Neurology

## 2023-11-15 NOTE — Patient Instructions (Signed)
 Visit Information  Ms. Casebolt was given information about Medicaid Managed Care team care coordination services as a part of their Healthy [facility] Medicaid benefit. ANNALYSA MOHAMMAD   If you would like to schedule transportation through your Healthy [facility] plan, please call the following number at least 2 days in advance of your appointment: [phone]  For information about your ride after you set it up, call Ride Assist at [phone]. Use this number to activate a Will Call pickup, or if your transportation is late for a scheduled pickup. Use this number, too, if you need to make a change or cancel a previously scheduled reservation.  If you need transportation services right away, call [phone]. The after-hours call center is staffed 24 hours to handle ride assistance and urgent reservation requests (including discharges) 365 days a year. Urgent trips include sick visits, hospital discharge requests and life-sustaining treatment.  Call the [facility] Line at [phone], at any time, 24 hours a day, 7 days a week. If you are in danger or need immediate medical attention call 911.   Please see education materials related to health Maintenance provided by MyChart link.  Patient verbalizes understanding of instructions and care plan provided today and agrees to view in MyChart. Active MyChart status and patient understanding of how to access instructions and care plan via MyChart confirmed with patient.     Telephone follow up appointment with Managed Medicaid care management team member scheduled for:  Duel Conrad RN RN Care Manager [facility] [phone]   Following is a copy of your plan of care:  There are no care plans that you recently modified to display for this patient.

## 2023-11-15 NOTE — Patient Outreach (Signed)
 Complex Care Management   Visit Note  11/15/2023  Name:  CARMINA WALLE MRN: [id number] DOB: [birth year]  Situation: Referral received for Complex Care Management related to cerevical disc disorder I obtained verbal consent from Patient.  Visit completed with Patient  on the phone  Background:   Past Medical History:  Diagnosis Date   Anxiety    Arthritis    Hypothyroidism     Assessment: Patient Reported Symptoms:  Cognitive Cognitive Status: No symptoms reported Cognitive/Intellectual Conditions Management [RPT]: None reported or documented in medical history or problem list   Health Maintenance Behaviors: Annual physical exam Healing Pattern: Fast Health Facilitated by: Stress management, Rest, Healthy diet  Neurological Neurological Review of Symptoms: No symptoms reported Neurological Management Strategies: Routine screening Neurological Self-Management Outcome: 4 (good)  HEENT HEENT Symptoms Reported: Nasal discharge HEENT Management Strategies: Routine screening HEENT Self-Management Outcome: 4 (good)    Cardiovascular Cardiovascular Symptoms Reported: No symptoms reported Does patient have uncontrolled Hypertension?: No Cardiovascular Management Strategies: Routine screening Cardiovascular Self-Management Outcome: 4 (good)  Respiratory Respiratory Symptoms Reported: Dry cough Respiratory Management Strategies: Routine screening Respiratory Self-Management Outcome: 4 (good)  Endocrine Endocrine Symptoms Reported: No symptoms reported Is patient diabetic?: No Endocrine Self-Management Outcome: 4 (good)  Gastrointestinal Gastrointestinal Symptoms Reported: No symptoms reported Gastrointestinal Self-Management Outcome: 4 (good)    Genitourinary Genitourinary Symptoms Reported: No symptoms reported Genitourinary Self-Management Outcome: 4 (good)  Integumentary Integumentary Symptoms Reported: No symptoms reported Skin Management Strategies: Routine screening Skin  Self-Management Outcome: 4 (good)  Musculoskeletal Musculoskelatal Symptoms Reviewed: No symptoms reported Musculoskeletal Management Strategies: Routine screening Musculoskeletal Self-Management Outcome: 4 (good) Falls in the past year?: No Number of falls in past year: 1 or less Was there an injury with Fall?: No Fall Risk Category Calculator: 0 Patient Fall Risk Level: Low Fall Risk Patient at Risk for Falls Due to: No Fall Risks Fall risk Follow up: Falls evaluation completed  Psychosocial Psychosocial Symptoms Reported: No symptoms reported Behavioral Health Self-Management Outcome: 4 (good) Major Change/Loss/Stressor/Fears (CP): Denies Techniques to Cope with Loss/Stress/Change: Not applicable Do you feel physically threatened by others?: No    11/15/2023    PHQ2-9 Depression Screening   Little interest or pleasure in doing things Not at all  Feeling down, depressed, or hopeless Not at all  PHQ-2 - Total Score 0  Trouble falling or staying asleep, or sleeping too much    Feeling tired or having little energy    Poor appetite or overeating     Feeling bad about yourself - or that you are a failure or have let yourself or your family down    Trouble concentrating on things, such as reading the newspaper or watching television    Moving or speaking so slowly that other people could have noticed.  Or the opposite - being so fidgety or restless that you have been moving around a lot more than usual    Thoughts that you would be better off dead, or hurting yourself in some way    PHQ2-9 Total Score    If you checked off any problems, how difficult have these problems made it for you to do your work, take care of things at home, or get along with other people    Depression Interventions/Treatment      There were no vitals filed for this visit. Pain Scale: 0-10 Pain Score: 0-No pain  Medications Reviewed Today     Reviewed by Nivia Sark , RN (Registered Nurse) on 11/15/23  at 1310  Med List Status: <None>   Medication Order Taking? Sig Documenting Provider Last Dose Status Informant  acetaminophen  (TYLENOL ) 500 MG tablet [id number] Yes Take 500 mg by mouth every 6 (six) hours as needed. [provider]  Active   celecoxib  (CELEBREX ) 200 MG capsule [id number] Yes Take 1 capsule (200 mg total) by mouth 2 (two) times daily.  Patient taking differently: Take 200 mg by mouth daily.   Hyatt, Max T, [place]  Active   escitalopram  (LEXAPRO ) 5 MG tablet [id number] Yes Take 1 tablet (5 mg total) by mouth daily. [name], FNP  Active   famotidine  (PEPCID ) 20 MG tablet [id number] Yes Take 20 mg by mouth daily. [provider]  Active   levothyroxine  (SYNTHROID ) 88 MCG tablet [id number] Yes Take 1 tablet (88 mcg total) by mouth daily before breakfast. Therisa Benton PARAS, NP  Active   Multiple Vitamin (MULTIVITAMIN) tablet [id number] Yes Take 1 tablet by mouth daily. [provider]  Active   pregabalin  (LYRICA ) 50 MG capsule [id number] Yes Take 1 capsule (50 mg total) by mouth 2 (two) times daily. [name], FNP  Active   rosuvastatin  (CRESTOR ) 20 MG tablet [id number] Yes TAKE ONE TABLET BY MOUTH ONCE DAILY. Melvenia Manus BRAVO, MD  Active             Recommendation:   Continue Current Plan of Care  Follow Up Plan:   Telephone follow-up in 1 month  Jeanpierre Thebeau RN RN Care Manager [facility] [phone]

## 2023-11-16 ENCOUNTER — Other Ambulatory Visit: Payer: Self-pay | Admitting: Internal Medicine

## 2023-11-16 DIAGNOSIS — E782 Mixed hyperlipidemia: Secondary | ICD-10-CM

## 2023-11-27 ENCOUNTER — Other Ambulatory Visit: Payer: Self-pay

## 2023-11-27 DIAGNOSIS — F419 Anxiety disorder, unspecified: Secondary | ICD-10-CM

## 2023-12-12 ENCOUNTER — Other Ambulatory Visit (HOSPITAL_COMMUNITY): Payer: Self-pay

## 2023-12-12 DIAGNOSIS — Z1231 Encounter for screening mammogram for malignant neoplasm of breast: Secondary | ICD-10-CM

## 2023-12-14 ENCOUNTER — Other Ambulatory Visit: Payer: Self-pay | Admitting: *Deleted

## 2023-12-14 NOTE — Patient Outreach (Signed)
 Complex Care Management   Visit Note  12/14/2023  Name:  [name] MRN: [id number] DOB: 1960/06/24  Situation: Referral received for Complex Care Management related to Cervical Disc Disorder I obtained verbal consent from Patient.  Visit completed with Patient  on the phone  Background:   Past Medical History:  Diagnosis Date   Anxiety    Arthritis    Hypothyroidism     Assessment: Patient Reported Symptoms:  Cognitive Cognitive Status: No symptoms reported Cognitive/Intellectual Conditions Management [RPT]: None reported or documented in medical history or problem list   Health Maintenance Behaviors: Annual physical exam Healing Pattern: Average Health Facilitated by: Rest  Neurological Neurological Review of Symptoms: No symptoms reported Neurological Management Strategies: Routine screening Neurological Self-Management Outcome: 4 (good)  HEENT HEENT Symptoms Reported: No symptoms reported HEENT Management Strategies: Routine screening HEENT Self-Management Outcome: 4 (good)    Cardiovascular Cardiovascular Symptoms Reported: No symptoms reported Does patient have uncontrolled Hypertension?: No Cardiovascular Management Strategies: Routine screening Cardiovascular Self-Management Outcome: 4 (good)  Respiratory Respiratory Symptoms Reported: No symptoms reported Respiratory Management Strategies: Routine screening Respiratory Self-Management Outcome: 4 (good)  Endocrine Endocrine Symptoms Reported: No symptoms reported Is patient diabetic?: No Endocrine Self-Management Outcome: 4 (good)  Gastrointestinal Gastrointestinal Symptoms Reported: No symptoms reported Gastrointestinal Self-Management Outcome: 4 (good)    Genitourinary Genitourinary Symptoms Reported: No symptoms reported Genitourinary Self-Management Outcome: 4 (good)  Integumentary Integumentary Symptoms Reported: No symptoms reported Skin Management Strategies: Routine screening Skin Self-Management  Outcome: 4 (good)  Musculoskeletal Musculoskelatal Symptoms Reviewed: No symptoms reported Musculoskeletal Management Strategies: Routine screening Musculoskeletal Self-Management Outcome: 4 (good) Falls in the past year?: No Number of falls in past year: 1 or less Was there an injury with Fall?: No Fall Risk Category Calculator: 0 Patient Fall Risk Level: Low Fall Risk Patient at Risk for Falls Due to: No Fall Risks Fall risk Follow up: Falls evaluation completed  Psychosocial Psychosocial Symptoms Reported: No symptoms reported Behavioral Management Strategies: Coping strategies Behavioral Health Self-Management Outcome: 4 (good) Major Change/Loss/Stressor/Fears (CP): Denies Techniques to Cope with Loss/Stress/Change: Not applicable      12/14/2023    PHQ2-9 Depression Screening   Little interest or pleasure in doing things Not at all  Feeling down, depressed, or hopeless Not at all  PHQ-2 - Total Score 0  Trouble falling or staying asleep, or sleeping too much    Feeling tired or having little energy    Poor appetite or overeating     Feeling bad about yourself - or that you are a failure or have let yourself or your family down    Trouble concentrating on things, such as reading the newspaper or watching television    Moving or speaking so slowly that other people could have noticed.  Or the opposite - being so fidgety or restless that you have been moving around a lot more than usual    Thoughts that you would be better off dead, or hurting yourself in some way    PHQ2-9 Total Score    If you checked off any problems, how difficult have these problems made it for you to do your work, take care of things at home, or get along with other people    Depression Interventions/Treatment      There were no vitals filed for this visit. Pain Scale: 0-10 Pain Score: 0-No pain  Medications Reviewed Today     Reviewed by Bertrum Rosina HERO, RN (Registered Nurse) on 12/14/23 at 1004   Med List  Status: <None>   Medication Order Taking? Sig Documenting Provider Last Dose Status Informant  acetaminophen  (TYLENOL ) 500 MG tablet [id number] Yes Take 500 mg by mouth every 6 (six) hours as needed. [provider]  Active   celecoxib  (CELEBREX ) 200 MG capsule [id number] Yes Take 1 capsule (200 mg total) by mouth 2 (two) times daily.  Patient taking differently: Take 200 mg by mouth daily.   Hyatt, Max T, [place]  Active   escitalopram  (LEXAPRO ) 10 MG tablet [id number]  Take 1 tablet (10 mg total) by mouth daily.  Patient not taking: Reported on 12/14/2023   [name], FNP  Consider Medication Status and Discontinue   escitalopram  (LEXAPRO ) 5 MG tablet [id number] Yes Take 1 tablet (5 mg total) by mouth daily. [name], FNP  Active   famotidine  (PEPCID ) 20 MG tablet [id number] Yes Take 20 mg by mouth daily. [provider]  Active   levothyroxine  (SYNTHROID ) 88 MCG tablet [id number] Yes Take 1 tablet (88 mcg total) by mouth daily before breakfast. Therisa Benton PARAS, NP  Active   Multiple Vitamin (MULTIVITAMIN) tablet [id number] Yes Take 1 tablet by mouth daily. [provider]  Active   pregabalin  (LYRICA ) 50 MG capsule [id number] Yes Take 1 capsule (50 mg total) by mouth 2 (two) times daily. [name], FNP  Active   rosuvastatin  (CRESTOR ) 20 MG tablet [id number] Yes TAKE ONE TABLET BY MOUTH ONCE DAILY. [name], FNP  Active             Recommendation:   Continue Current Plan of Care  Follow Up Plan:   Patient has met all care management goals. Care Management case will be closed. Patient has been provided contact information should new needs arise.   [name], BSN RN [facility], [facility] RN Care Manager Direct Dial: [phone]  Fax: [phone]

## 2023-12-14 NOTE — Patient Instructions (Signed)
 Visit Information  [name] was given information about Medicaid Managed Care team care coordination services as a part of their Healthy [facility] Medicaid benefit. [name]   If you would like to schedule transportation through your Healthy [name] plan, please call the following number at least 2 days in advance of your appointment: [phone]  For information about your ride after you set it up, call Ride Assist at [phone]. Use this number to activate a Will Call pickup, or if your transportation is late for a scheduled pickup. Use this number, too, if you need to make a change or cancel a previously scheduled reservation.  If you need transportation services right away, call [phone]. The after-hours call center is staffed 24 hours to handle ride assistance and urgent reservation requests (including discharges) 365 days a year. Urgent trips include sick visits, hospital discharge requests and life-sustaining treatment.  Call the [facility] Line at [phone], at any time, 24 hours a day, 7 days a week. If you are in danger or need immediate medical attention call 911.   Please see education materials related to Health Maintenance provided by MyChart link.  Patient verbalizes understanding of instructions and care plan provided today and agrees to view in MyChart. Active MyChart status and patient understanding of how to access instructions and care plan via MyChart confirmed with patient.     No further follow up required: Goals Met, Care Plan Completed  [name], BSN RN [facility]  [facility], [facility] RN Care Manager Direct Dial: [phone]  Fax: [phone]   Following is a copy of your plan of care:   Goals Addressed             This Visit's Progress    COMPLETED: [facility] RN Care Plan       Problems:  Chronic Disease Management support and education needs related to cervical disc disorder  Goal: Over the  next 90 days the Patient will attend all scheduled medical appointments: with providers as evidenced by appointment Encounter notes in EMR         collaborate with the care management team towards completion of advanced directives   as evidenced by advanced directives being uploaded into EPIC  demonstrate Improved adherence to prescribed treatment plan for cervical disc disorder as evidenced by a decrease in self reported pain scores.  take all medications exactly as prescribed and will call provider for medication related questions as evidenced by communication with provider for any concerns or questions related to medications     verbalize understanding of plan for management of cervical disc disorder as evidenced by verbalizing taking medication as directed, modifying rest and activity as needed, Heat/Ice therapy as needed, lifestyle changes (Smoking cessation)   Interventions:   Evaluation of current treatment plan related to cervical disc disorder,  self-management and patient's adherence to plan as established by provider. No pain reported patient states no issues post injection.  Discussed plans with patient for ongoing care management follow up and provided patient with direct contact information for care management team Evaluation of current treatment plan related to cervical disc disorder and patient's adherence to plan as established by provider Advised patient to quit smoking. Reported that she would be interested in smoking cessation. Provided 1-800 QUIT NOW. Patient reports that she has reduced her cigarette usage. Provided education to patient and/or caregiver about advanced directives Reviewed medications with patient and discussed importance of taking as prescribed  Provided patient with educational  materials related to smoking cessation. Reviewed scheduled/upcoming provider appointments including PCP appointment  Screening for signs and symptoms of depression related to chronic  disease state  Assessed social determinant of health barriers  Patient Self-Care Activities:  Attend all scheduled provider appointments Call pharmacy for medication refills 3-7 days in advance of running out of medications Call provider office for new concerns or questions  Notify RN Care Manager of TOC call rescheduling needs Perform all self care activities independently  Perform IADL's (shopping, preparing meals, housekeeping, managing finances) independently Take medications as prescribed    Plan:  Telephone follow up appointment with care management team member scheduled for:  12-14-2023 at 10:00 aM

## 2023-12-24 ENCOUNTER — Ambulatory Visit (HOSPITAL_COMMUNITY)

## 2023-12-31 ENCOUNTER — Ambulatory Visit: Payer: Self-pay | Admitting: *Deleted

## 2023-12-31 NOTE — Telephone Encounter (Signed)
 FYI Only or Action Required?: FYI only for provider: appointment scheduled on 12/30.  Patient was last seen in primary care on 10/12/2023 by [name], FNP.  Called Nurse Triage reporting Nasal Congestion (Sore throat, ear pain).  Symptoms began several days ago.  Interventions attempted: Rest, hydration, or home remedies.  Symptoms are: gradually worsening.  Triage Disposition: See Physician Within 24 Hours  Patient/caregiver understands and will follow disposition?: YesReason for Triage: Patient is experiencing right ear pain every once and a while and nasal congestion mucus is sometimes light greenish color. Symptoms started Christmas night with a sore throat. Patient would like to see if doctor could in something for her symptoms.  Call back [phone]   Reason for Disposition  Earache  Answer Assessment - Initial Assessment Questions 1. LOCATION: Where does it hurt?      Ear pain- R,headache, sore throat 2. ONSET: When did the sinus pain start?  (e.g., hours, days)      12/25 3. SEVERITY: How bad is the pain?   (Scale 0-10; or none, mild, moderate or severe)     Mucus- pressure 4. RECURRENT SYMPTOM: Have you ever had sinus problems before? If Yes, ask: When was the last time? and What happened that time?      Not improving, maybe once during the winter  5. NASAL CONGESTION: Is the nose blocked? If Yes, ask: Can you open it or must you breathe through your mouth?     Yes- - R side clogged 6. NASAL DISCHARGE: Do you have discharge from your nose? If so ask, What color?     greenish 7. FEVER: Do you have a fever? If Yes, ask: What is it, how was it measured, and when did it start?      no 8. OTHER SYMPTOMS: Do you have any other symptoms? (e.g., sore throat, cough, earache, difficulty breathing)     Ear ache, sore throat, cough  Protocols used: Sinus Pain or Congestion-A-AH

## 2023-12-31 NOTE — Telephone Encounter (Signed)
Noted patient scheduled

## 2024-01-01 ENCOUNTER — Ambulatory Visit: Admitting: Family Medicine

## 2024-01-01 VITALS — BP 105/92 | HR 76 | Temp 97.7°F | Ht 64.5 in | Wt 148.1 lb

## 2024-01-01 DIAGNOSIS — J0100 Acute maxillary sinusitis, unspecified: Secondary | ICD-10-CM | POA: Diagnosis not present

## 2024-01-01 DIAGNOSIS — J019 Acute sinusitis, unspecified: Secondary | ICD-10-CM | POA: Insufficient documentation

## 2024-01-01 MED ORDER — AMOXICILLIN-POT CLAVULANATE 875-125 MG PO TABS: 1.0000 | ORAL_TABLET | Freq: Two times a day (BID) | ORAL | 0 refills | Status: AC

## 2024-01-01 NOTE — Assessment & Plan Note (Signed)
 Treating with Augmentin.

## 2024-01-01 NOTE — Progress Notes (Signed)
 "  Subjective:  Patient ID: [name], female    DOB: 1960/03/12  Age: 63 y.o. MRN: [id number]  CC:   Chief Complaint  Patient presents with   Cough    Patient is here for experiencing a slight cough, extreme head pressure, and ear ache/pain since Friday     HPI:  63 year old female presents for evaluation of respiratory symptoms.  Patient reports that she has been sick since Christmas.  She has had sore throat, sinus pain, pressure, and congestion.  No fever.  She states that she was around her grandkids who have had runny noses.  She is using nasal saline with some improvement.  Also reports some right ear discomfort.  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    Social Hx   Social History   Socioeconomic History   Marital status: Single    Spouse name: Not on file   Number of children: Not on file   Years of education: Not on file   Highest education level: Not on file  Occupational History   Not on file  Tobacco Use   Smoking status: Every Day    Current packs/day: 0.10    Average packs/day: 0.1 packs/day for 15.0 years (1.5 ttl pk-yrs)    Types: Cigarettes    Passive exposure: Past   Smokeless tobacco: Never  Vaping Use   Vaping status: Never Used  Substance and Sexual Activity   Alcohol use: No   Drug use: No   Sexual activity: Yes    Birth control/protection: None  Other Topics Concern   Not on file  Social History Narrative   Not on file   Social Drivers of Health   Tobacco Use: High Risk  (10/30/2023)   Patient History    Smoking Tobacco Use: Every Day    Smokeless Tobacco Use: Never    Passive Exposure: Past  Financial Resource Strain: Not on file  Food Insecurity: No Food Insecurity (09/26/2023)   Epic    Worried About Programme Researcher, Broadcasting/film/video in the Last Year: Never true    [employer] of Food in the Last Year: Never true  Transportation Needs: No Transportation Needs (09/26/2023)   Epic    Lack of Transportation (Medical): No    Lack of Transportation (Non-Medical): No  Physical Activity: Not on file  Stress: Not on file  Social Connections: Not on file  Depression (PHQ2-9): Low Risk (01/01/2024)   Depression (PHQ2-9)    PHQ-2 Score: 0  Alcohol Screen: Not on file  Housing: Unknown (09/26/2023)   Epic    Unable to Pay for Housing in the Last Year: Not on file    Number of Times Moved in the Last Year: 0    Homeless in the Last Year: No  Utilities: Not At Risk (09/26/2023)   Epic    Threatened with loss of utilities: No  Health Literacy: Not on file    Review of Systems Per HPI  Objective:  BP (!) 105/92 (BP Location: Left Arm, Patient Position: Sitting)   Pulse 76   Temp 97.7  F (36.5 C)   Ht 5' 4.5 (1.638 m)   Wt 148 lb 2 oz (67.2 kg)   LMP 01/11/2013   SpO2 96%   BMI 25.03 kg/m      01/01/2024    3:38 PM 10/30/2023    1:44 PM 10/12/2023    2:10 PM  BP/Weight  Systolic BP 105 100 103  Diastolic BP 92 66 70  Wt. (Lbs) 148.13 145.6 143.12  BMI 25.03 kg/m2 24.61 kg/m2 24.57 kg/m2    Physical Exam Vitals and nursing note reviewed.  Constitutional:      General: She is not in acute distress. HENT:     Head: Normocephalic and atraumatic.     Right Ear: Tympanic membrane normal.     Left Ear: There is impacted cerumen.     Nose: Congestion present.     Mouth/Throat:     Pharynx: Posterior oropharyngeal erythema present.  Cardiovascular:     Rate and Rhythm: Normal rate and regular rhythm.  Pulmonary:     Effort: Pulmonary effort is normal.      Breath sounds: Normal breath sounds. No wheezing or rales.  Neurological:     Mental Status: She is alert.     Lab Results  Component Value Date   WBC 10.5 07/02/2023   HGB 15.4 (H) 07/02/2023   HCT 46.1 (H) 07/02/2023   PLT 297 07/02/2023   GLUCOSE 65 (L) 10/23/2023   CHOL 158 01/26/2023   TRIG 255 (H) 01/26/2023   HDL 44 01/26/2023   LDLCALC 73 01/26/2023   ALT 19 10/23/2023   AST 18 10/23/2023   NA 140 10/23/2023   K 5.3 (H) 10/23/2023   CL 103 10/23/2023   CREATININE 0.64 10/23/2023   BUN 15 10/23/2023   CO2 24 10/23/2023   TSH 1.190 10/23/2023   HGBA1C 5.8 (H) 10/23/2023     Assessment & Plan:  Acute maxillary sinusitis, recurrence not specified Assessment & Plan: Treating with Augmentin.  Orders: -     Amoxicillin-Pot Clavulanate; Take 1 tablet by mouth 2 (two) times daily.  Dispense: 20 tablet; Refill: 0    Follow-up:  Return if symptoms worsen or fail to improve.  Jacqulyn Ahle DO [facility] "

## 2024-01-07 ENCOUNTER — Ambulatory Visit (HOSPITAL_COMMUNITY)

## 2024-01-07 ENCOUNTER — Telehealth: Payer: Self-pay

## 2024-01-07 ENCOUNTER — Other Ambulatory Visit: Payer: Self-pay

## 2024-01-07 DIAGNOSIS — F419 Anxiety disorder, unspecified: Secondary | ICD-10-CM

## 2024-01-07 MED ORDER — ESCITALOPRAM OXALATE 10 MG PO TABS: 10.0000 mg | ORAL_TABLET | Freq: Every day | ORAL | 1 refills | Status: AC

## 2024-01-07 NOTE — Telephone Encounter (Signed)
 Copied from CRM [id number]. Topic: Clinical - Medication Refill >> Jan 07, 2024  8:26 AM Delon HERO wrote: Medication: escitalopram  (LEXAPRO ) 10 MG tablet [[id number]] Patient d/c 10 mg went to the 5mg . Patient feels like she needs to return to the 10mg .  Patient's dog passed. Patient not sleeping & resting  well  Has the patient contacted their pharmacy? Yes (Agent: If no, request that the patient contact the pharmacy for the refill. If patient does not wish to contact the pharmacy document the reason why and proceed with request.) (Agent: If yes, when and what did the pharmacy advise?)  This is the patient's preferred pharmacy:  [facility] - [place], [place] - [address] [place] [address] Phone: [phone] Fax: [phone]    Is this the correct pharmacy for this prescription? Yes If no, delete pharmacy and type the correct one.   Has the prescription been filled recently? Yes  Is the patient out of the medication? Yes  Has the patient been seen for an appointment in the last year OR does the patient have an upcoming appointment? Yes  Can we respond through MyChart? Yes  Agent: Please be advised that Rx refills may take up to 3 business days. We ask that you follow-up with your pharmacy.

## 2024-01-07 NOTE — Telephone Encounter (Signed)
 Lexapro  10 mg sent to [facility]

## 2024-01-07 NOTE — Telephone Encounter (Signed)
 Pt advised with verbal understanding

## 2024-01-09 ENCOUNTER — Ambulatory Visit (HOSPITAL_COMMUNITY)
Admission: RE | Admit: 2024-01-09 | Discharge: 2024-01-09 | Disposition: A | Discharge status: Home / Self Care | Source: Ambulatory Visit

## 2024-01-09 DIAGNOSIS — Z1231 Encounter for screening mammogram for malignant neoplasm of breast: Secondary | ICD-10-CM | POA: Diagnosis present

## 2024-01-10 ENCOUNTER — Other Ambulatory Visit: Payer: Self-pay

## 2024-01-10 ENCOUNTER — Ambulatory Visit: Payer: Self-pay

## 2024-01-10 MED ORDER — PROMETHAZINE HCL 25 MG PO TABS: 25.0000 mg | ORAL_TABLET | Freq: Three times a day (TID) | ORAL | 0 refills | Status: AC | PRN

## 2024-01-10 NOTE — Telephone Encounter (Signed)
 FYI Only or Action Required?: Action required by provider: Would like rx for nausea after having lexapro  increased from 5 mg to 10 mg.  Patient was last seen in primary care on 01/01/2024 by Cook, Jayce G, DO.  Called Nurse Triage reporting Medication Problem and Nausea.  Symptoms began several days ago.  Interventions attempted: Prescription medications: Lexparo.  Symptoms are: stable.  Triage Disposition: Call PCP When Office is Open (overriding See PCP When Office is Open (Within 3 Days))  Patient/caregiver understands and will follow disposition?: Yes    Lexapro  increased from 5 to 10 mg on 01-28-2024 by PCP after pts dog died. Has been having mild nausea since, no emesis. Asking for rx for nausea. Forwarding request to office. Advised to call back for worsening symptoms.    Copied from CRM #[id number]. Topic: Clinical - Red Word Triage >> Jan 10, 2024  9:32 AM Wess RAMAN wrote: Red Word that prompted transfer to Nurse Triage: Patient would like escitalopram  (LEXAPRO ) 10 MG tablet called in. Feeling Nausea for several days in the morning and during the day.   Pharmacy: [facility] - [place], [place] - [address] [place] [address] Phone: [phone] Fax: [phone] Hours: Not open 24 hours Reason for Disposition  Prescription request for new medicine (not a refill)  Answer Assessment - Initial Assessment Questions 1. NAME of MEDICINE: What medicine(s) are you calling about?     Lexapro   2. QUESTION: What is your question? (e.g., double dose of medicine, side effect)     Has been having nausea since increasing Lexapro  dose from 5 to 10 mg on 2024-01-28  3. PRESCRIBER: Who prescribed the medicine? Reason: if prescribed by specialist, call should be referred to that group.     Leita Longs, NP  4. SYMPTOMS: Do you have any symptoms? If Yes, ask: What symptoms are you having?  How bad are the symptoms (e.g., mild, moderate,  severe)     Mild nausea, no emesis  Protocols used: Medication Question Call-A-AH

## 2024-01-10 NOTE — Telephone Encounter (Signed)
 Medication sent to her pharmacy.  She is likely nauseated f/t antibiotic that she is on.

## 2024-01-10 NOTE — Telephone Encounter (Signed)
 Patient advised.

## 2024-01-17 ENCOUNTER — Ambulatory Visit: Admitting: Neurology

## 2024-01-21 NOTE — Progress Notes (Signed)
 "  Initial neurology clinic note  Reason for Evaluation: Consultation requested by [name], FNP for an opinion regarding numbness, tingling, and burning in her feet and headache. My final recommendations will be communicated back to the requesting physician by way of shared medical record or letter to requesting physician via [place]  mail.  HPI: This is Ms. [name], a 64 y.o. left-handed female with a medical history of pre-DM, HLD, hypothyroidism, OA, anxiety who presents to neurology clinic with the chief complaint of numbness, tingling, and burning in her feet and headache. The patient is alone today.  Patient's symptoms started with neck pain about 1 year ago. She has tightness of her shoulders at well. She gets injections by ortho for this. She thinks this is better than previous. She has not done PT. She will get headaches as well that seem to come from her neck and radiates more to her left side of head. She thinks she has about 3 headaches per month. She describes it as a throbbing pain. She rates the pain as mild. She sometimes takes tylenol  that sometimes helps. The headache generally lasts about 1 hour. She denies photophobia, phonophobia, nausea, or vomiting. She denies any vision changes.  She then started noticing numbness and tingling in her feet, maybe in the fall of 2025. She has seen her PCP and podiatry for her symptoms. She has been told she has arthritis. She has previously fractured her left foot as well. She has soreness in the shin area and mentions her veins are more prominent. She bruises much easier than normal as well. She sometimes has back pain running down the back of her right thigh. She has aches of the left posterior knee as well.  She denies any symptoms in her arms. She denies imbalance or falls.  Patient saw podiatry on 08/14/23 whose assessment was Posterior tibial tendinitis left pes planovalgus bilateral osteoarthritis bilateral midfoot right greater  than left with neuropathy per notes. She was given meloxicam  and celecoxib  for pain. She does not think this helps.  She was given gabapentin  by PCP over a year ago, maybe for neck pain. This was stopped at some point. She is now on Lyrica  50 mg twice daily. She does not think this helps much.  Of note, patient is on Lexapro  for anxiety.  She does not report any constitutional symptoms like fever, night sweats, anorexia or unintentional weight loss.  EtOH use: none  Current smoker: < 1/2 pack per day for ~15 years Restrictive diet? No Caffeine use: sweet tea 4-5 glasses per day, decaf coffee in morning Family history of neurologic disease, including headaches: no   MEDICATIONS:  Outpatient Encounter Medications as of 01/31/2024  Medication Sig   acetaminophen  (TYLENOL ) 500 MG tablet Take 500 mg by mouth every 6 (six) hours as needed.   celecoxib  (CELEBREX ) 200 MG capsule Take 1 capsule (200 mg total) by mouth 2 (two) times daily. (Patient taking differently: Take 200 mg by mouth daily.)   escitalopram  (LEXAPRO ) 10 MG tablet Take 1 tablet (10 mg total) by mouth daily.   famotidine  (PEPCID ) 20 MG tablet Take 20 mg by mouth daily. (Patient taking differently: Take 20 mg by mouth as needed.)   levothyroxine  (SYNTHROID ) 88 MCG tablet Take 1 tablet (88 mcg total) by mouth daily before breakfast.   Multiple Vitamin (MULTIVITAMIN) tablet Take 1 tablet by mouth daily.   pregabalin  (LYRICA ) 50 MG capsule Take 1 capsule (50 mg total) by mouth 2 (two) times daily.  promethazine  (PHENERGAN ) 25 MG tablet Take 1 tablet (25 mg total) by mouth every 8 (eight) hours as needed for nausea or vomiting.   rosuvastatin  (CRESTOR ) 20 MG tablet TAKE ONE TABLET BY MOUTH ONCE DAILY.   amoxicillin -clavulanate (AUGMENTIN ) 875-125 MG tablet Take 1 tablet by mouth 2 (two) times daily. (Patient not taking: Reported on 01/31/2024)   No facility-administered encounter medications on file as of 01/31/2024.    PAST  MEDICAL HISTORY: Past Medical History:  Diagnosis Date   Anxiety    Arthritis    Hypothyroidism     PAST SURGICAL HISTORY: Past Surgical History:  Procedure Laterality Date   APPENDECTOMY     Bilateral bunionectomy     COLONOSCOPY N/A 01/01/2015   Procedure: COLONOSCOPY;  Surgeon: Margo LITTIE Haddock, MD;  [facility]: [facility];  Service: Endoscopy;  Laterality: N/A;  11:15 AM - pt knows to arrive at 11:15   COLONOSCOPY N/A 07/30/2023   Procedure: COLONOSCOPY;  Surgeon: Cindie Carlin POUR, DO;  Location: [facility];  Service: Endoscopy;  Laterality: N/A;  130pm, asa 2   ESOPHAGOGASTRODUODENOSCOPY N/A 07/30/2023   Procedure: EGD (ESOPHAGOGASTRODUODENOSCOPY);  Surgeon: Cindie Carlin POUR, DO;  Location: [facility];  Service: Endoscopy;  Laterality: N/A;   LID LESION EXCISION Right 12/29/2020   Procedure: LID LESION EXCISION;  Surgeon: Jacques Sharper, MD;  Location: [facility];  Service: Ophthalmology;  Laterality: Right;  LOCAL   THYROIDECTOMY N/A 08/15/2013   Procedure: TOTAL THYROIDECTOMY;  Surgeon: Oneil DELENA Budge, MD;  Location: [facility];  Service: General;  Laterality: N/A;    ALLERGIES: Allergies[1]  FAMILY HISTORY: Family History  Problem Relation Age of Onset   Colon cancer Neg Hx    Inflammatory bowel disease Neg Hx     SOCIAL HISTORY: Social History[2] Social History   Social History Narrative   Are you right handed or left handed? left   Are you currently employed ?    What is your current occupation?retired   Do you live at home alone?   Who lives with you? family   What type of home do you live in: 1 story or 2 story? two   Caffiene 2-4 teas a day      OBJECTIVE: PHYSICAL EXAM: BP 103/68   Pulse 77   Ht 5' 4.5 (1.638 m)   Wt 152 lb (68.9 kg)   LMP 01/11/2013   SpO2 97%   BMI 25.69 kg/m   General: General appearance: Awake and alert. No distress. Cooperative with exam.  Skin: No obvious rash or jaundice. HEENT: Atraumatic.  Anicteric. Paraspinal muscles of the neck are tight. Reduced range of motion of the neck. Lungs: Non-labored breathing on room air  Heart: Regular Extremities: No edema. Mild arthritic changes in hands and feet. Psych: Affect appropriate.  Neurological: Mental Status: Alert. Speech fluent. No pseudobulbar affect Cranial Nerves: CNII: No RAPD. Visual fields grossly intact. CNIII, IV, VI: PERRL. No nystagmus. EOMI. CN V: Facial sensation intact bilaterally to fine touch. CN VII: Facial muscles symmetric and strong. No ptosis at rest. CN VIII: Hearing grossly intact bilaterally. CN IX: No hypophonia. CN X: Palate elevates symmetrically. CN XI: Full strength shoulder shrug bilaterally. CN XII: Tongue protrusion full and midline. No atrophy or fasciculations. No significant dysarthria Motor: Tone is normal. Strength is 5/5 in bilateral upper and lower extremities  Reflexes:  Right Left   Bicep 2+ 2+   Tricep 2+ 2+   BrRad 2+ 2+   Knee 2+ 2+  Ankle 2+ 2+    Pathological Reflexes: Babinski: flexor response bilaterally Hoffman: absent bilaterally Troemner: absent bilaterally Sensation: Pinprick: Intact in all extremities Vibration: Intact in all extremities Proprioception: Intact in bilateral great toes Coordination: Intact finger-to- nose-finger bilaterally. Romberg negative. Gait: Able to rise from chair with arms crossed unassisted. Normal, narrow-based gait.  Lab and Test Review: Internal labs: 10/23/23: Vit D wnl HbA1c: 5.8 CMP significant for K 5.3 TSH wnl   2//25: Folate > 20 Ferritin 160   Imaging/Procedures: MRI brain w/wo contrast (11/12/2003): Unremarkable MRI of the brain.  Small incidental venous angioma, right frontal lobe.  This represents a benign finding and represents a variant of venous drainage/disordered venous drainage.  Venous angiomas are usually incidental findings and only rarely are associated with hemorrhage or symptoms.  Pansinusitis.     Cervical spine xray (02/02/23): IMPRESSION: 1. Multilevel degenerative changes of the cervical spine most pronounced at C6-7 where there is moderate intervertebral disc space height loss and trace retrolisthesis. 2. Severe RIGHT osseous neuroforaminal narrowing at C3-4 and C4-5.  EKG (07/02/23): NSR; normal QT  ASSESSMENT: [name] is a 64 y.o. female who presents for evaluation of numbness and tingling in her feet and headaches. She has a relevant medical history of pre-DM, HLD, hypothyroidism, OA, anxiety. Her neurological examination is essentially normal. She does have tightness and reduced range of motion of her neck. Available diagnostic data is significant for HbA1c of 5.8, normal TSH, normal folate, and cervical spine xray showing severe right neuroforaminal narrowing at C3-4 and C4-5. 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 She is getting injection with ortho. I will also send her to PT to attempt to help.  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 Her exam argues against significant nerve damage given her normal strength, sensation, and reflexes, but this is still possible. I will get labs to look for treatable causes and an EMG to further clarify.  PLAN: -Blood work: B1, B12, IFE -EMG of bilateral lower limbs -Physical therapy for cervicogenic headaches -Continue to follow with ortho for neck -Continue Lyrica  50 mg BID for now (patient preference to continue current dose) -Lidocaine  cream as needed -Alpha lipoic acid 600 mg once or twice daily  -Return to clinic to be determined  The impression above as well as the plan as outlined below were extensively discussed with the patient who voiced understanding. All questions were answered to their satisfaction.  When available, results of the above investigations and possible further recommendations will be communicated to  the patient via telephone/MyChart. Patient to call office if not contacted after expected testing turnaround time.   Total time spent reviewing records, interview, history/exam, documentation, and coordination of care on day of encounter:  50 min   Thank you for allowing me to participate in patient's care.  If I can answer any additional questions, I would be pleased to do so.  Venetia Potters, MD   CC: [name], FNP [address] [place] [address]  CC: Referring provider: [name], FNP [address] [place],  [place] [address]     [1] No Known Allergies [2]  Social History Tobacco Use   Smoking status: Every Day    Current packs/day: 0.10    Average packs/day: 0.1 packs/day for 15.0 years (1.5 ttl pk-yrs)    Types: Cigarettes    Passive exposure: Past  Smokeless tobacco: Never   Tobacco comments:    Less than 1/2 pack a day  Vaping Use   Vaping status: Never Used  Substance Use Topics   Alcohol use: No   Drug use: No   "

## 2024-01-22 ENCOUNTER — Ambulatory Visit: Payer: Self-pay | Admitting: Nurse Practitioner

## 2024-01-22 LAB — COMPREHENSIVE METABOLIC PANEL WITH GFR
ALT: 20 IU/L (ref 0–32)
AST: 14 IU/L (ref 0–40)
Albumin: 4.5 g/dL (ref 3.9–4.9)
Alkaline Phosphatase: 65 IU/L (ref 49–135)
BUN/Creatinine Ratio: 25 (ref 12–28)
BUN: 14 mg/dL (ref 8–27)
Bilirubin Total: 0.3 mg/dL (ref 0.0–1.2)
CO2: 25 mmol/L (ref 20–29)
Calcium: 10 mg/dL (ref 8.7–10.3)
Chloride: 103 mmol/L (ref 96–106)
Creatinine, Ser: 0.56 mg/dL — ABNORMAL LOW (ref 0.57–1.00)
Globulin, Total: 2.2 g/dL (ref 1.5–4.5)
Glucose: 76 mg/dL (ref 70–99)
Potassium: 5 mmol/L (ref 3.5–5.2)
Sodium: 142 mmol/L (ref 134–144)
Total Protein: 6.7 g/dL (ref 6.0–8.5)
eGFR: 104 mL/min/1.73

## 2024-01-22 LAB — T4, FREE: Free T4: 1.71 ng/dL (ref 0.82–1.77)

## 2024-01-22 LAB — HEMOGLOBIN A1C
Est. average glucose Bld gHb Est-mCnc: 120 mg/dL
Hgb A1c MFr Bld: 5.8 % — ABNORMAL HIGH (ref 4.8–5.6)

## 2024-01-22 LAB — TSH: TSH: 0.749 u[IU]/mL (ref 0.450–4.500)

## 2024-01-22 LAB — VITAMIN D 25 HYDROXY (VIT D DEFICIENCY, FRACTURES): Vit D, 25-Hydroxy: 37.7 ng/mL (ref 30.0–100.0)

## 2024-01-31 ENCOUNTER — Ambulatory Visit: Admitting: Neurology

## 2024-01-31 ENCOUNTER — Encounter: Payer: Self-pay | Admitting: Neurology

## 2024-01-31 ENCOUNTER — Other Ambulatory Visit

## 2024-01-31 VITALS — BP 103/68 | HR 77 | Ht 64.5 in | Wt 152.0 lb

## 2024-01-31 DIAGNOSIS — R5383 Other fatigue: Secondary | ICD-10-CM | POA: Diagnosis not present

## 2024-01-31 DIAGNOSIS — R202 Paresthesia of skin: Secondary | ICD-10-CM

## 2024-01-31 DIAGNOSIS — G4486 Cervicogenic headache: Secondary | ICD-10-CM

## 2024-01-31 DIAGNOSIS — R200 Anesthesia of skin: Secondary | ICD-10-CM

## 2024-01-31 NOTE — Patient Instructions (Addendum)
 I saw you today for numbness and tingling in your feet and headaches/neck pain.  I think your headaches are coming from your tight neck. In addition to continuing to see orthopedics, I am referring you to physical therapy to help with your neck.  For the numbness and tingling in your legs, I am not sure the cause. I want to investigate further with the following: -Blood work today -Nerve testing of your legs called EMG (see more information below)  I will be in touch when I have those results and we will discuss next steps.  Continue Lyrica  50 mg twice daily for now.  You can also try Lidocaine  cream as needed. Apply where you have pain, tingling, or burning. Wear gloves to prevent your hands being numb. This can be bought over the counter at any drug store or online.  Alpha lipoic acid 600mg  daily has some research data suggesting it helps with nerve health. No major side effects other than <1% of people report upset stomach. This can be taken twice per day (1200mg  daily) if no relief obtained. You can buy this over the counter or online.  Please let me know if you have any questions or concerns in the meantime.  The physicians and staff at [facility] are committed to providing excellent care. You may receive a survey requesting feedback about your experience at our office. We strive to receive very good responses to the survey questions. If you feel that your experience would prevent you from giving the office a very good  response, please contact our office to try to remedy the situation. We may be reached at [phone]. Thank you for taking the time out of your busy day to complete the survey.  Venetia Potters, MD [facility]  ELECTROMYOGRAM AND NERVE CONDUCTION STUDIES (EMG/NCS) INSTRUCTIONS  How to Prepare The neurologist conducting the EMG will need to know if you have certain medical conditions. Tell the neurologist and other EMG lab personnel if you: Have a  pacemaker or any other electrical medical device Take blood-thinning medications Have hemophilia, a blood-clotting disorder that causes prolonged bleeding Bathing Take a shower or bath shortly before your exam in order to remove oils from your skin. Dont apply lotions or creams before the exam.  What to Expect Youll likely be asked to change into a hospital gown for the procedure and lie down on an examination table. The following explanations can help you understand what will happen during the exam.  Electrodes. The neurologist or a technician places surface electrodes at various locations on your skin depending on where youre experiencing symptoms. Or the neurologist may insert needle electrodes at different sites depending on your symptoms.  Sensations. The electrodes will at times transmit a tiny electrical current that you may feel as a twinge or spasm. The needle electrode may cause discomfort or pain that usually ends shortly after the needle is removed. If you are concerned about discomfort or pain, you may want to talk to the neurologist about taking a short break during the exam.  Instructions. During the needle EMG, the neurologist will assess whether there is any spontaneous electrical activity when the muscle is at rest - activity that isnt present in healthy muscle tissue - and the degree of activity when you slightly contract the muscle.  He or she will give you instructions on resting and contracting a muscle at appropriate times. Depending on what muscles and nerves the neurologist is examining, he or she may ask you  to change positions during the exam.  After your EMG You may experience some temporary, minor bruising where the needle electrode was inserted into your muscle. This bruising should fade within several days. If it persists, contact your primary care doctor.

## 2024-02-06 LAB — IMMUNOFIXATION ELECTROPHORESIS
IgM, Serum: 54 mg/dL (ref 50–300)
IgM, Serum: 822 mg/dL (ref 600–300)
Immunoglobulin A: 215 mg/dL (ref 70–320)
Immunoglobulin A: 822 mg/dL (ref 70–320)

## 2024-02-06 LAB — VITAMIN B12: Vitamin B-12: 449 pg/mL (ref 200–1100)

## 2024-02-06 LAB — VITAMIN B1: Vitamin B1 (Thiamine): 24 nmol/L (ref 8–30)

## 2024-02-07 ENCOUNTER — Ambulatory Visit: Payer: Self-pay | Admitting: Neurology

## 2024-02-08 ENCOUNTER — Telehealth: Payer: Self-pay | Admitting: Neurology

## 2024-02-08 NOTE — Telephone Encounter (Signed)
 Called pt and informed her of Dr. Venus response to her question. She understood and had no more questions or concerns.

## 2024-02-08 NOTE — Telephone Encounter (Signed)
 Access Nurse 02/07/24 5:13pm Caller States Dr.Hill recommended the Pt get alpha litoic acid 600mg . Pt googled info and wants to make sure it won't interact with her Rx Lezothyroxine 88mg 

## 2024-02-27 ENCOUNTER — Ambulatory Visit (HOSPITAL_COMMUNITY)

## 2024-02-27 ENCOUNTER — Ambulatory Visit: Admitting: Neurology

## 2024-03-11 ENCOUNTER — Ambulatory Visit

## 2024-03-24 ENCOUNTER — Encounter: Payer: Self-pay | Admitting: Neurology

## 2024-10-29 ENCOUNTER — Ambulatory Visit: Admitting: Nurse Practitioner
